# Patient Record
Sex: Female | Born: 1960 | Race: Black or African American | Hispanic: No | Smoking: Never smoker
Health system: Southern US, Community
[De-identification: ages and names within clinical notes are randomized; demographics above are authoritative.]

## PROBLEM LIST (undated history)

## (undated) DIAGNOSIS — K644 Residual hemorrhoidal skin tags: Secondary | ICD-10-CM

## (undated) DIAGNOSIS — R7303 Prediabetes: Secondary | ICD-10-CM

## (undated) DIAGNOSIS — S82092A Other fracture of left patella, initial encounter for closed fracture: Secondary | ICD-10-CM

## (undated) DIAGNOSIS — R002 Palpitations: Secondary | ICD-10-CM

## (undated) DIAGNOSIS — F419 Anxiety disorder, unspecified: Secondary | ICD-10-CM

## (undated) DIAGNOSIS — D649 Anemia, unspecified: Secondary | ICD-10-CM

## (undated) DIAGNOSIS — I471 Supraventricular tachycardia: Secondary | ICD-10-CM

## (undated) DIAGNOSIS — K219 Gastro-esophageal reflux disease without esophagitis: Secondary | ICD-10-CM

## (undated) DIAGNOSIS — F411 Generalized anxiety disorder: Secondary | ICD-10-CM

## (undated) DIAGNOSIS — E559 Vitamin D deficiency, unspecified: Secondary | ICD-10-CM

## (undated) DIAGNOSIS — N63 Unspecified lump in unspecified breast: Secondary | ICD-10-CM

## (undated) DIAGNOSIS — R011 Cardiac murmur, unspecified: Secondary | ICD-10-CM

## (undated) DIAGNOSIS — F320 Major depressive disorder, single episode, mild: Secondary | ICD-10-CM

## (undated) DIAGNOSIS — J45909 Unspecified asthma, uncomplicated: Secondary | ICD-10-CM

## (undated) DIAGNOSIS — R519 Headache, unspecified: Secondary | ICD-10-CM

## (undated) DIAGNOSIS — R55 Syncope and collapse: Secondary | ICD-10-CM

## (undated) DIAGNOSIS — G44229 Chronic tension-type headache, not intractable: Secondary | ICD-10-CM

## (undated) DIAGNOSIS — K589 Irritable bowel syndrome without diarrhea: Secondary | ICD-10-CM

## (undated) HISTORY — DX: Palpitations: R00.2

## (undated) HISTORY — DX: Other fracture of left patella, initial encounter for closed fracture: S82.092A

## (undated) HISTORY — DX: Gastro-esophageal reflux disease without esophagitis: K21.9

## (undated) HISTORY — DX: Anxiety disorder, unspecified: F41.9

## (undated) HISTORY — PX: ABDOMINAL HYSTERECTOMY: SHX81

## (undated) HISTORY — DX: Supraventricular tachycardia: I47.1

## (undated) HISTORY — DX: Unspecified lump in unspecified breast: N63.0

## (undated) HISTORY — PX: LAPAROSCOPIC TOTAL HYSTERECTOMY: SUR800

## (undated) HISTORY — DX: Vitamin D deficiency, unspecified: E55.9

## (undated) HISTORY — DX: Irritable bowel syndrome, unspecified: K58.9

## (undated) HISTORY — DX: Residual hemorrhoidal skin tags: K64.4

---

## 2005-06-23 ENCOUNTER — Ambulatory Visit: Payer: Self-pay | Admitting: Family Medicine

## 2005-07-07 ENCOUNTER — Ambulatory Visit: Payer: Self-pay | Admitting: Family Medicine

## 2007-04-29 ENCOUNTER — Ambulatory Visit: Payer: Self-pay | Admitting: Family Medicine

## 2007-06-06 ENCOUNTER — Ambulatory Visit: Payer: Self-pay | Admitting: Family Medicine

## 2007-06-24 ENCOUNTER — Ambulatory Visit: Payer: Self-pay | Admitting: Gastroenterology

## 2007-06-24 LAB — HM COLONOSCOPY: HM Colonoscopy: NORMAL

## 2007-07-15 ENCOUNTER — Ambulatory Visit: Payer: Self-pay | Admitting: Internal Medicine

## 2007-07-15 DIAGNOSIS — K644 Residual hemorrhoidal skin tags: Secondary | ICD-10-CM | POA: Insufficient documentation

## 2007-07-19 ENCOUNTER — Ambulatory Visit: Payer: Self-pay | Admitting: Internal Medicine

## 2007-08-15 ENCOUNTER — Ambulatory Visit: Payer: Self-pay | Admitting: Internal Medicine

## 2007-09-15 ENCOUNTER — Ambulatory Visit: Payer: Self-pay | Admitting: Internal Medicine

## 2007-09-30 ENCOUNTER — Ambulatory Visit: Payer: Self-pay | Admitting: General Surgery

## 2007-10-13 ENCOUNTER — Ambulatory Visit: Payer: Self-pay | Admitting: Internal Medicine

## 2007-10-15 ENCOUNTER — Ambulatory Visit: Payer: Self-pay | Admitting: Internal Medicine

## 2007-11-13 ENCOUNTER — Ambulatory Visit: Payer: Self-pay | Admitting: Internal Medicine

## 2007-11-21 ENCOUNTER — Ambulatory Visit: Payer: Self-pay | Admitting: Internal Medicine

## 2007-12-13 ENCOUNTER — Ambulatory Visit: Payer: Self-pay | Admitting: Internal Medicine

## 2008-01-13 ENCOUNTER — Ambulatory Visit: Payer: Self-pay | Admitting: Internal Medicine

## 2008-03-13 ENCOUNTER — Ambulatory Visit: Payer: Self-pay | Admitting: Internal Medicine

## 2008-03-14 ENCOUNTER — Ambulatory Visit: Payer: Self-pay | Admitting: Internal Medicine

## 2008-04-14 ENCOUNTER — Ambulatory Visit: Payer: Self-pay | Admitting: Internal Medicine

## 2008-04-24 ENCOUNTER — Ambulatory Visit: Payer: Self-pay | Admitting: Internal Medicine

## 2008-05-14 ENCOUNTER — Ambulatory Visit: Payer: Self-pay | Admitting: Internal Medicine

## 2008-06-14 ENCOUNTER — Ambulatory Visit: Payer: Self-pay | Admitting: Internal Medicine

## 2008-06-19 ENCOUNTER — Ambulatory Visit: Payer: Self-pay | Admitting: Internal Medicine

## 2008-07-14 ENCOUNTER — Ambulatory Visit: Payer: Self-pay | Admitting: Internal Medicine

## 2008-08-14 ENCOUNTER — Ambulatory Visit: Payer: Self-pay | Admitting: Internal Medicine

## 2008-09-10 ENCOUNTER — Ambulatory Visit: Payer: Self-pay | Admitting: Endocrinology

## 2008-09-14 ENCOUNTER — Ambulatory Visit: Payer: Self-pay | Admitting: Internal Medicine

## 2008-09-25 ENCOUNTER — Ambulatory Visit: Payer: Self-pay | Admitting: Endocrinology

## 2008-10-12 ENCOUNTER — Ambulatory Visit: Payer: Self-pay | Admitting: Internal Medicine

## 2008-10-22 ENCOUNTER — Ambulatory Visit: Payer: Self-pay | Admitting: Internal Medicine

## 2008-10-30 ENCOUNTER — Ambulatory Visit: Payer: Self-pay | Admitting: Obstetrics and Gynecology

## 2008-11-12 ENCOUNTER — Ambulatory Visit: Payer: Self-pay | Admitting: Internal Medicine

## 2008-11-19 ENCOUNTER — Inpatient Hospital Stay: Payer: Self-pay | Admitting: Obstetrics and Gynecology

## 2008-12-12 ENCOUNTER — Ambulatory Visit: Payer: Self-pay | Admitting: Internal Medicine

## 2008-12-22 ENCOUNTER — Ambulatory Visit: Payer: Self-pay | Admitting: Internal Medicine

## 2009-01-12 ENCOUNTER — Ambulatory Visit: Payer: Self-pay | Admitting: Internal Medicine

## 2009-03-14 ENCOUNTER — Ambulatory Visit: Payer: Self-pay | Admitting: Internal Medicine

## 2009-03-16 ENCOUNTER — Ambulatory Visit: Payer: Self-pay | Admitting: Internal Medicine

## 2009-04-01 DIAGNOSIS — N63 Unspecified lump in unspecified breast: Secondary | ICD-10-CM | POA: Insufficient documentation

## 2009-04-14 ENCOUNTER — Ambulatory Visit: Payer: Self-pay | Admitting: Internal Medicine

## 2009-05-14 ENCOUNTER — Ambulatory Visit: Payer: Self-pay | Admitting: Internal Medicine

## 2009-06-14 ENCOUNTER — Ambulatory Visit: Payer: Self-pay | Admitting: Internal Medicine

## 2009-07-14 ENCOUNTER — Ambulatory Visit: Payer: Self-pay | Admitting: Internal Medicine

## 2009-08-11 ENCOUNTER — Ambulatory Visit: Payer: Self-pay | Admitting: Internal Medicine

## 2009-08-14 ENCOUNTER — Ambulatory Visit: Payer: Self-pay | Admitting: Internal Medicine

## 2009-10-12 ENCOUNTER — Ambulatory Visit: Payer: Self-pay | Admitting: Internal Medicine

## 2009-11-03 ENCOUNTER — Ambulatory Visit: Payer: Self-pay | Admitting: Internal Medicine

## 2009-11-12 ENCOUNTER — Ambulatory Visit: Payer: Self-pay | Admitting: Internal Medicine

## 2011-06-13 ENCOUNTER — Ambulatory Visit: Payer: Self-pay | Admitting: Family Medicine

## 2011-06-16 LAB — HM DIABETES EYE EXAM

## 2011-08-15 HISTORY — PX: BREAST BIOPSY: SHX20

## 2012-01-05 ENCOUNTER — Other Ambulatory Visit: Payer: Self-pay | Admitting: Family Medicine

## 2012-01-05 LAB — COMPREHENSIVE METABOLIC PANEL
Albumin: 3.5 g/dL (ref 3.4–5.0)
Alkaline Phosphatase: 57 U/L (ref 50–136)
Anion Gap: 7 (ref 7–16)
Bilirubin,Total: 0.3 mg/dL (ref 0.2–1.0)
Calcium, Total: 8.5 mg/dL (ref 8.5–10.1)
Co2: 28 mmol/L (ref 21–32)
Creatinine: 0.68 mg/dL (ref 0.60–1.30)
EGFR (African American): >60
EGFR (Non-African Amer.): >60
Glucose: 78 mg/dL (ref 65–99)
SGPT (ALT): 14 U/L

## 2012-01-05 LAB — CBC WITH DIFFERENTIAL/PLATELET
Basophil #: 0 10*3/uL (ref 0.0–0.1)
Basophil %: 0.4 %
Eosinophil #: 0.2 10*3/uL (ref 0.0–0.7)
Eosinophil %: 2.9 %
HCT: 36.6 % (ref 35.0–47.0)
HGB: 11.7 g/dL — ABNORMAL LOW (ref 12.0–16.0)
Lymphocyte %: 28 %
MCV: 92 fL (ref 80–100)
RBC: 3.98 10*6/uL (ref 3.80–5.20)
RDW: 14.5 % (ref 11.5–14.5)
WBC: 8.2 10*3/uL (ref 3.6–11.0)

## 2012-01-10 ENCOUNTER — Ambulatory Visit: Payer: Self-pay | Admitting: Family Medicine

## 2012-01-12 ENCOUNTER — Encounter: Payer: Self-pay | Admitting: Cardiovascular Disease

## 2012-01-12 ENCOUNTER — Ambulatory Visit (INDEPENDENT_AMBULATORY_CARE_PROVIDER_SITE_OTHER): Payer: No Typology Code available for payment source | Admitting: Cardiovascular Disease

## 2012-01-12 VITALS — BP 128/70 | HR 59 | Ht 64.0 in | Wt 129.2 lb

## 2012-01-12 DIAGNOSIS — R002 Palpitations: Secondary | ICD-10-CM

## 2012-01-12 DIAGNOSIS — R079 Chest pain, unspecified: Secondary | ICD-10-CM

## 2012-01-12 DIAGNOSIS — R0602 Shortness of breath: Secondary | ICD-10-CM

## 2012-01-12 NOTE — Assessment & Plan Note (Signed)
The patient reports increased symptoms of palpitations with her chest pain lately mostly at night. She has no previous history of arrhythmia. I recommend a 48-hour Holter monitor. If this shows significant abnormalities, an echocardiogram can be performed.

## 2012-01-12 NOTE — Patient Instructions (Signed)
Your physician has recommended that you wear a holter monitor. Holter monitors are medical devices that record the heart's electrical activity. Doctors most often use these monitors to diagnose arrhythmias. Arrhythmias are problems with the speed or rhythm of the heartbeat. The monitor is a small, portable device. You can wear one while you do your normal daily activities. This is usually used to diagnose what is causing palpitations/syncope (passing out).  Your physician has requested that you have an exercise tolerance test. For further information please visit [email or web address] Please also follow instruction sheet, as given.  Follow up as needed if tests are abnormal.

## 2012-01-12 NOTE — Assessment & Plan Note (Signed)
Her chest pain is atypical and likely due to gastroesophageal reflux disease. However, her symptoms persisted in spite of treatment with omeprazole. I recommend proceeding with a treadmill stress test for further evaluation. Her cardiac exam is unremarkable and she has no cardiac murmurs. She reports no dyspnea.

## 2012-01-12 NOTE — Progress Notes (Signed)
HPI  This is a 51 year old female who was referred by Dr. Carlynn Purl for evaluation of chest pain and palpitations. The patient has no previous cardiac history. She has history of gastroesophageal reflux disease for many years. She had worsening of her chest pain at night recently described as an aching sensation without radiation. The discomfort mostly happens at rest specifically at night. She has not had any significant dyspnea. She has noticed increased palpitations with these episodes lately but not on a daily basis. She feels her heart is going fast with occasional skipping. She is not aware of any previous history of arrhythmia. She was given omeprazole recently and had improvement in her symptoms. However, they did not resolve.  No Known Allergies   Current Outpatient Prescriptions on File Prior to Visit  Medication Sig Dispense Refill  . omeprazole (PRILOSEC) 20 MG capsule Take 20 mg by mouth 2 (two) times daily.          Past Medical History  Diagnosis Date  . Unspecified vitamin D deficiency   . External hemorrhoids without mention of complication   . Lump or mass in breast   . Palpitations   . Diabetes mellitus     borderline?     Past Surgical History  Procedure Date  . Laparoscopic total hysterectomy      History reviewed. No pertinent family history.   History   Social History  . Marital Status: Married    Spouse Name: N/A    Number of Children: N/A  . Years of Education: N/A   Occupational History  . Not on file.   Social History Main Topics  . Smoking status: Never Smoker   . Smokeless tobacco: Not on file  . Alcohol Use: No  . Drug Use: No  . Sexually Active:    Other Topics Concern  . Not on file   Social History Narrative  . No narrative on file     ROS Constitutional: Negative for fever, chills, diaphoresis, activity change, appetite change and fatigue.  HENT: Negative for hearing loss, nosebleeds, congestion, sore throat, facial  swelling, drooling, trouble swallowing, neck pain, voice change, sinus pressure and tinnitus.  Eyes: Negative for photophobia, pain, discharge and visual disturbance.  Respiratory: Negative for apnea, cough, shortness of breath and wheezing.  Cardiovascular: Negative for and leg swelling.  Gastrointestinal: Negative for nausea, vomiting, abdominal pain, diarrhea, constipation, blood in stool and abdominal distention.  Genitourinary: Negative for dysuria, urgency, frequency, hematuria and decreased urine volume.  Musculoskeletal: Negative for myalgias, back pain, joint swelling, arthralgias and gait problem.  Skin: Negative for color change, pallor, rash and wound.  Neurological: Negative for dizziness, tremors, seizures, syncope, speech difficulty, weakness, light-headedness, numbness and headaches.  Psychiatric/Behavioral: Negative for suicidal ideas, hallucinations, behavioral problems and agitation. The patient is not nervous/anxious.     PHYSICAL EXAM   BP 128/70  Pulse 59  Ht 5\' 4"  (1.626 m)  Wt 129 lb 4 oz (58.627 kg)  BMI 22.19 kg/m2 Constitutional: She is oriented to person, place, and time. She appears well-developed and well-nourished. No distress.  HENT: No nasal discharge.  Head: Normocephalic and atraumatic.  Eyes: Pupils are equal and round. Right eye exhibits no discharge. Left eye exhibits no discharge.  Neck: Normal range of motion. Neck supple. No JVD present. No thyromegaly present.  Cardiovascular: Normal rate, regular rhythm, normal heart sounds. Exam reveals no gallop and no friction rub. No murmur heard.  Pulmonary/Chest: Effort normal and breath sounds normal. No  stridor. No respiratory distress. She has no wheezes. She has no rales. She exhibits no tenderness.  Abdominal: Soft. Bowel sounds are normal. She exhibits no distension. There is no tenderness. There is no rebound and no guarding.  Musculoskeletal: Normal range of motion. She exhibits no edema and no  tenderness.  Neurological: She is alert and oriented to person, place, and time. Coordination normal.  Skin: Skin is warm and dry. No rash noted. She is not diaphoretic. No erythema. No pallor.  Psychiatric: She has a normal mood and affect. Her behavior is normal. Judgment and thought content normal.     EKG: Sinus  Bradycardia  -  Diffuse nonspecific T-abnormality.    ASSESSMENT AND PLAN

## 2012-01-25 ENCOUNTER — Encounter: Payer: No Typology Code available for payment source | Admitting: Cardiovascular Disease

## 2012-02-01 ENCOUNTER — Encounter: Payer: Self-pay | Admitting: Cardiovascular Disease

## 2012-02-01 ENCOUNTER — Encounter (INDEPENDENT_AMBULATORY_CARE_PROVIDER_SITE_OTHER): Payer: No Typology Code available for payment source

## 2012-02-01 ENCOUNTER — Ambulatory Visit (INDEPENDENT_AMBULATORY_CARE_PROVIDER_SITE_OTHER): Payer: No Typology Code available for payment source | Admitting: Cardiovascular Disease

## 2012-02-01 VITALS — BP 120/72 | HR 59 | Ht 64.0 in | Wt 129.2 lb

## 2012-02-01 DIAGNOSIS — R0600 Dyspnea, unspecified: Secondary | ICD-10-CM

## 2012-02-01 DIAGNOSIS — R079 Chest pain, unspecified: Secondary | ICD-10-CM

## 2012-02-01 DIAGNOSIS — R002 Palpitations: Secondary | ICD-10-CM

## 2012-02-01 DIAGNOSIS — R0989 Other specified symptoms and signs involving the circulatory and respiratory systems: Secondary | ICD-10-CM

## 2012-02-01 DIAGNOSIS — R0609 Other forms of dyspnea: Secondary | ICD-10-CM

## 2012-02-01 NOTE — Procedures (Signed)
    Treadmill Stress test  Indication: Atypical chest pain and palpitations.  Baseline Data:  Resting EKG shows NSR with rate of 79 bpm, nonspecific T wave changes Resting blood pressure of 120/72 mm Hg Stand bruce protocal was used.  Exercise Data:  Patient exercised for 3 min 0 sec,  Peak heart rate of 238 bpm.  This was 140 % of the maximum predicted heart rate. She had sinus tachycardia with a heart rate of 160 beats per minute followed by a brief run of supraventricular tachycardia. 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 She converted to normal sinus rhythm quickly in recovery. No symptoms of chest pain or lightheadedness were reported at peak stress or in recovery.  Peak Blood pressure recorded was 130/80 Maximal work level: 4.6 METs.  Heart rate at 3 minutes in recovery was 101 bpm. BP response: Normal HR response: Accelerated  EKG with Exercise: Sinus tachycardia with no significant ST changes.  FINAL IMPRESSION: Normal exercise stress test. No significant EKG changes concerning for ischemia. Poor exercise tolerance. Exercise induced supraventricular tachycardia with a heart rate of 238 beats per minute. 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  Recommendation: Proceed with 48-hour Holter monitor. I will also obtain an echocardiogram. Followup after cardiac testing. She will need treatment with calcium channel or beta blocker. EP consult will be considered.

## 2012-02-01 NOTE — Patient Instructions (Addendum)
Your stress test was fine but showed fast heartbeats.  Holter monitor today.  Schedule an Echo.  Your physician has requested that you have an echocardiogram. Echocardiography is a painless test that uses sound waves to create images of your heart. It provides your doctor with information about the size and shape of your heart and how well your heart's chambers and valves are working. This procedure takes approximately one hour. There are no restrictions for this procedure.  Follow up after echo

## 2012-02-09 ENCOUNTER — Telehealth: Payer: Self-pay

## 2012-02-09 NOTE — Telephone Encounter (Signed)
Pt called back. Pt informed of results Understanding verb.

## 2012-02-09 NOTE — Telephone Encounter (Signed)
Attempted to reach pt re: nml HM results. No answer VM box has not been set up yet

## 2012-02-19 ENCOUNTER — Other Ambulatory Visit: Payer: No Typology Code available for payment source

## 2012-02-23 ENCOUNTER — Ambulatory Visit: Payer: Self-pay | Admitting: Surgery

## 2012-02-23 ENCOUNTER — Ambulatory Visit: Payer: No Typology Code available for payment source | Admitting: Cardiovascular Disease

## 2012-02-27 ENCOUNTER — Other Ambulatory Visit (INDEPENDENT_AMBULATORY_CARE_PROVIDER_SITE_OTHER): Payer: No Typology Code available for payment source

## 2012-02-27 ENCOUNTER — Other Ambulatory Visit: Payer: Self-pay

## 2012-02-27 DIAGNOSIS — R0600 Dyspnea, unspecified: Secondary | ICD-10-CM

## 2012-02-27 DIAGNOSIS — R0989 Other specified symptoms and signs involving the circulatory and respiratory systems: Secondary | ICD-10-CM

## 2012-02-27 DIAGNOSIS — R0609 Other forms of dyspnea: Secondary | ICD-10-CM

## 2012-03-01 ENCOUNTER — Ambulatory Visit: Payer: No Typology Code available for payment source | Admitting: Cardiovascular Disease

## 2012-03-04 ENCOUNTER — Ambulatory Visit: Payer: No Typology Code available for payment source | Admitting: Cardiovascular Disease

## 2012-03-20 ENCOUNTER — Encounter: Payer: Self-pay | Admitting: Cardiovascular Disease

## 2012-04-08 ENCOUNTER — Ambulatory Visit: Payer: No Typology Code available for payment source | Admitting: Cardiovascular Disease

## 2012-04-12 ENCOUNTER — Encounter: Payer: Self-pay | Admitting: Cardiovascular Disease

## 2012-04-12 ENCOUNTER — Ambulatory Visit: Payer: No Typology Code available for payment source | Admitting: Cardiovascular Disease

## 2012-04-12 ENCOUNTER — Ambulatory Visit (INDEPENDENT_AMBULATORY_CARE_PROVIDER_SITE_OTHER): Payer: No Typology Code available for payment source | Admitting: Cardiovascular Disease

## 2012-04-12 VITALS — BP 110/70 | HR 67 | Ht 64.0 in | Wt 126.8 lb

## 2012-04-12 DIAGNOSIS — I471 Supraventricular tachycardia, unspecified: Secondary | ICD-10-CM

## 2012-04-12 DIAGNOSIS — R002 Palpitations: Secondary | ICD-10-CM

## 2012-04-12 DIAGNOSIS — R079 Chest pain, unspecified: Secondary | ICD-10-CM

## 2012-04-12 DIAGNOSIS — R0602 Shortness of breath: Secondary | ICD-10-CM

## 2012-04-12 HISTORY — DX: Supraventricular tachycardia: I47.1

## 2012-04-12 HISTORY — DX: Supraventricular tachycardia, unspecified: I47.10

## 2012-04-12 NOTE — Patient Instructions (Addendum)
Follow up in 6 months 

## 2012-04-12 NOTE — Assessment & Plan Note (Signed)
This improved significantly and was likely due to GERD.

## 2012-04-12 NOTE — Assessment & Plan Note (Signed)
The patient had one documented episode of exercise-induced supraventricular tachycardia during treadmill stress test. Currently she reports improved symptoms of palpitations and no tachycardia. Her description of the palpitations seems to be consistent more with premature beats then with sustained supraventricular tachycardia. I'm hesitant to start a beta blocker or calcium channel blocker due to her relatively low blood pressure. Given that her symptoms improved, I think it's reasonable to monitor her and consider an antiarrhythmic medications if she develops recurrent episodes. I will have her followup with me in 6 months I advised her to call earlier if she has recurrent symptoms

## 2012-04-12 NOTE — Progress Notes (Signed)
HPI  This is a 51 year old female who is here today for a followup visit regarding chest pain and palpitations. The patient has no previous cardiac history. She has history of gastroesophageal reflux disease for many years. She had worsening of her chest pain at night recently described as an aching sensation without radiation. The discomfort mostly happens at rest specifically at night. She has not had any significant dyspnea. She has noticed increased palpitations with these episodes lately but not on a daily basis. She underwent a treadmill stress test which showed no evidence of ischemia. However, at peak stress, she went into supraventricular tachycardia which was brief and terminated spontaneously. She had a Holter monitor done which showed PVCs without other arrhythmia. Echocardiogram was normal. She reports improved symptoms today with less palpitations. She denies any long episodes of tachycardia. Her chest pain is significantly better.  No Known Allergies   Current Outpatient Prescriptions on File Prior to Visit  Medication Sig Dispense Refill  . omeprazole (PRILOSEC) 20 MG capsule Take 20 mg by mouth daily.          Past Medical History  Diagnosis Date  . Unspecified vitamin D deficiency   . External hemorrhoids without mention of complication   . Lump or mass in breast   . Palpitations   . Diabetes mellitus     borderline?     Past Surgical History  Procedure Date  . Laparoscopic total hysterectomy      History reviewed. No pertinent family history.   History   Social History  . Marital Status: Married    Spouse Name: N/A    Number of Children: N/A  . Years of Education: N/A   Occupational History  . Not on file.   Social History Main Topics  . Smoking status: Never Smoker   . Smokeless tobacco: Not on file  . Alcohol Use: No  . Drug Use: No  . Sexually Active:    Other Topics Concern  . Not on file   Social History Narrative  . No narrative on  file      PHYSICAL EXAM   BP 110/70  Pulse 67  Ht 5\' 4"  (1.626 m)  Wt 126 lb 12 oz (57.493 kg)  BMI 21.76 kg/m2 Constitutional: She is oriented to person, place, and time. She appears well-developed and well-nourished. No distress.  HENT: No nasal discharge.  Head: Normocephalic and atraumatic.  Eyes: Pupils are equal and round. Right eye exhibits no discharge. Left eye exhibits no discharge.  Neck: Normal range of motion. Neck supple. No JVD present. No thyromegaly present.  Cardiovascular: Normal rate, regular rhythm, normal heart sounds. Exam reveals no gallop and no friction rub. No murmur heard.  Pulmonary/Chest: Effort normal and breath sounds normal. No stridor. No respiratory distress. She has no wheezes. She has no rales. She exhibits no tenderness.  Abdominal: Soft. Bowel sounds are normal. She exhibits no distension. There is no tenderness. There is no rebound and no guarding.  Musculoskeletal: Normal range of motion. She exhibits no edema and no tenderness.  Neurological: She is alert and oriented to person, place, and time. Coordination normal.  Skin: Skin is warm and dry. No rash noted. She is not diaphoretic. No erythema. No pallor.  Psychiatric: She has a normal mood and affect. Her behavior is normal. Judgment and thought content normal.     EKG: Sinus  Rhythm  -  Nonspecific T-abnormality.   ABNORMAL    ASSESSMENT AND PLAN

## 2012-05-23 ENCOUNTER — Ambulatory Visit: Payer: Self-pay | Admitting: Family Medicine

## 2012-05-24 LAB — HM DEXA SCAN: HM Dexa Scan: NORMAL

## 2012-07-10 ENCOUNTER — Emergency Department: Payer: Self-pay | Admitting: Emergency Medicine

## 2012-07-16 ENCOUNTER — Other Ambulatory Visit: Payer: Self-pay | Admitting: General Practice

## 2012-10-10 ENCOUNTER — Ambulatory Visit: Payer: No Typology Code available for payment source | Admitting: Cardiovascular Disease

## 2012-10-14 ENCOUNTER — Ambulatory Visit (INDEPENDENT_AMBULATORY_CARE_PROVIDER_SITE_OTHER): Payer: 59 | Admitting: Cardiovascular Disease

## 2012-10-14 ENCOUNTER — Encounter: Payer: Self-pay | Admitting: Cardiovascular Disease

## 2012-10-14 ENCOUNTER — Encounter: Payer: Self-pay | Admitting: *Deleted

## 2012-10-14 VITALS — BP 130/80 | HR 66 | Ht 64.0 in | Wt 125.5 lb

## 2012-10-14 DIAGNOSIS — R079 Chest pain, unspecified: Secondary | ICD-10-CM

## 2012-10-14 DIAGNOSIS — I471 Supraventricular tachycardia: Secondary | ICD-10-CM

## 2012-10-14 DIAGNOSIS — R0602 Shortness of breath: Secondary | ICD-10-CM

## 2012-10-14 MED ORDER — METOPROLOL TARTRATE 25 MG PO TABS: 25.0000 mg | ORAL_TABLET | Freq: Two times a day (BID) | ORAL | Status: DC | PRN

## 2012-10-14 NOTE — Assessment & Plan Note (Signed)
The patient has not had any documented episodes of SVT over the last 6 months. She had recent palpitations and tachycardia in the setting of anxiety. Given that her symptoms are not very frequent, I will provide her with metoprolol tartrate 25 mg twice daily to be used as needed only for tachycardia .

## 2012-10-14 NOTE — Patient Instructions (Addendum)
Use Metoprolol twice daily as needed for fast heart beats.  Follow up in 6 months.
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
## 2012-11-13 ENCOUNTER — Ambulatory Visit: Payer: Self-pay | Admitting: Family Medicine

## 2012-12-06 ENCOUNTER — Ambulatory Visit: Payer: Self-pay | Admitting: Gastroenterology

## 2013-04-11 ENCOUNTER — Encounter: Payer: Self-pay | Admitting: Cardiovascular Disease

## 2013-04-11 ENCOUNTER — Ambulatory Visit (INDEPENDENT_AMBULATORY_CARE_PROVIDER_SITE_OTHER): Payer: 59 | Admitting: Cardiovascular Disease

## 2013-04-11 VITALS — BP 102/74 | HR 67 | Ht 64.0 in | Wt 136.2 lb

## 2013-04-11 DIAGNOSIS — I471 Supraventricular tachycardia, unspecified: Secondary | ICD-10-CM

## 2013-04-11 DIAGNOSIS — R0789 Other chest pain: Secondary | ICD-10-CM

## 2013-04-11 DIAGNOSIS — R079 Chest pain, unspecified: Secondary | ICD-10-CM

## 2013-04-11 NOTE — Patient Instructions (Addendum)
Continue same medications  Follow up in 1 year

## 2013-04-11 NOTE — Progress Notes (Signed)
HPI  This is a 52 year old female who is here today for a followup visit regarding paroxysmal supraventricular tachycardia. She was in 2013 for chest pain and palpitations. She underwent a treadmill stress test which showed no evidence of ischemia. However, at peak stress, she went into supraventricular tachycardia with a heart rate of 220 beats per minute which was brief and terminated spontaneously. She had a Holter monitor done which showed PVCs without other arrhythmia. Echocardiogram was normal.  Her episodes have not been frequent and thus she was placed on metoprolol 25 mg twice daily to be used as needed. She reports no change in her symptoms. There has been no worsening. On average, she has to use metoprolol once a week or every 2 weeks. She denies any prolonged tachycardia.  No Known Allergies   Current Outpatient Prescriptions on File Prior to Visit  Medication Sig Dispense Refill  . acetaminophen (TYLENOL) 500 MG chewable tablet Chew 500 mg by mouth every 6 (six) hours as needed.      Marland Kitchen aspirin 81 MG tablet Take 81 mg by mouth as needed.       . metoprolol tartrate (LOPRESSOR) 25 MG tablet Take 1 tablet (25 mg total) by mouth 2 (two) times daily as needed (for fast heart beats).  60 tablet  6  . omeprazole (PRILOSEC) 20 MG capsule Take 20 mg by mouth daily.       . Vitamin D, Ergocalciferol, (DRISDOL) 50000 UNITS CAPS Take 50,000 Units by mouth every 7 (seven) days.       No current facility-administered medications on file prior to visit.     Past Medical History  Diagnosis Date  . Unspecified vitamin D deficiency   . External hemorrhoids without mention of complication   . Lump or mass in breast   . Palpitations   . Diabetes mellitus     borderline?  Marland Kitchen PSVT (paroxysmal supraventricular tachycardia) 04/12/2012     Past Surgical History  Procedure Laterality Date  . Laparoscopic total hysterectomy       Family History  Problem Relation Age of Onset  . Family  history unknown: Yes     History   Social History  . Marital Status: Married    Spouse Name: N/A    Number of Children: N/A  . Years of Education: N/A   Occupational History  . Not on file.   Social History Main Topics  . Smoking status: Never Smoker   . Smokeless tobacco: Not on file  . Alcohol Use: No  . Drug Use: No  . Sexual Activity: Not on file   Other Topics Concern  . Not on file   Social History Narrative  . No narrative on file      PHYSICAL EXAM   BP 102/74  Pulse 67  Ht 5\' 4"  (1.626 m)  Wt 136 lb 4 oz (61.803 kg)  BMI 23.38 kg/m2 Constitutional: She is oriented to person, place, and time. She appears well-developed and well-nourished. No distress.  HENT: No nasal discharge.  Head: Normocephalic and atraumatic.  Eyes: Pupils are equal and round. Right eye exhibits no discharge. Left eye exhibits no discharge.  Neck: Normal range of motion. Neck supple. No JVD present. No thyromegaly present.  Cardiovascular: Normal rate, regular rhythm, normal heart sounds. Exam reveals no gallop and no friction rub. No murmur heard.  Pulmonary/Chest: Effort normal and breath sounds normal. No stridor. No respiratory distress. She has no wheezes. She has no rales. She exhibits  no tenderness.  Abdominal: Soft. Bowel sounds are normal. She exhibits no distension. There is no tenderness. There is no rebound and no guarding.  Musculoskeletal: Normal range of motion. She exhibits no edema and no tenderness.  Neurological: She is alert and oriented to person, place, and time. Coordination normal.  Skin: Skin is warm and dry. No rash noted. She is not diaphoretic. No erythema. No pallor.  Psychiatric: She has a normal mood and affect. Her behavior is normal. Judgment and thought content normal.     NWG:[id number]  Rhythm  -  Diffuse nonspecific T-abnormality.   ABNORMAL     ASSESSMENT AND PLAN

## 2013-04-11 NOTE — Assessment & Plan Note (Signed)
No prolonged episodes of tachycardia. Her symptoms are controlled on metoprolol which is being used as needed. Continue current treatment. Followup in one year or earlier if needed.

## 2013-05-05 ENCOUNTER — Telehealth: Payer: Self-pay

## 2013-05-05 NOTE — Telephone Encounter (Signed)
Spoke w/ pt.  She states that this weekend, she had an "episode" where her "heart was floating, racing and hurting me".  She sat down for "a little bit" as she did not want to go the ED and symptoms resolved on resting.  Asked pt if she took her metoprolol, as she has an rx for metoprolol 25mg  bid prn for tachycardia. She states that she has not taken it, as she "didn't know what it was for". Instructed pt to monitor her heart rate, and if it is elevated, she can take up to two pills a day. Asked her to call me if she does not have the medication or if she can any other questions or concerns.   Pt to call the office immediately if she has any more chest pain.

## 2013-11-14 ENCOUNTER — Ambulatory Visit: Payer: Self-pay | Admitting: Family Medicine

## 2013-11-14 LAB — HM MAMMOGRAPHY: HM Mammogram: NORMAL

## 2014-03-29 ENCOUNTER — Ambulatory Visit: Payer: Self-pay

## 2014-03-29 LAB — URINALYSIS, COMPLETE
BLOOD: NEGATIVE
Bilirubin,UR: NEGATIVE
GLUCOSE, UR: NEGATIVE
Ketone: NEGATIVE
Nitrite: NEGATIVE
Ph: 6 (ref 5.0–8.0)
Protein: NEGATIVE
Specific Gravity: 1.005 (ref 1.000–1.030)

## 2014-03-29 LAB — COMPREHENSIVE METABOLIC PANEL
Albumin: 3.8 g/dL (ref 3.4–5.0)
Alkaline Phosphatase: 85 U/L
Anion Gap: 9 (ref 7–16)
BUN: 12 mg/dL (ref 7–18)
Bilirubin,Total: 0.4 mg/dL (ref 0.2–1.0)
CALCIUM: 9.7 mg/dL (ref 8.5–10.1)
CO2: 27 mmol/L (ref 21–32)
Chloride: 102 mmol/L (ref 98–107)
Creatinine: 0.81 mg/dL (ref 0.60–1.30)
EGFR (African American): >60
EGFR (Non-African Amer.): >60
GLUCOSE: 101 mg/dL — AB (ref 65–99)
Osmolality: 276 (ref 275–301)
Potassium: 3.7 mmol/L (ref 3.5–5.1)
SGOT(AST): 16 U/L (ref 15–37)
SGPT (ALT): 19 U/L
SODIUM: 138 mmol/L (ref 136–145)
TOTAL PROTEIN: 8.5 g/dL — AB (ref 6.4–8.2)

## 2014-03-29 LAB — CBC WITH DIFFERENTIAL/PLATELET
BASOS PCT: 0.4 %
Basophil #: 0.1 10*3/uL (ref 0.0–0.1)
EOS ABS: 0 10*3/uL (ref 0.0–0.7)
EOS PCT: 0.1 %
HCT: 37.7 % (ref 35.0–47.0)
HGB: 12.5 g/dL (ref 12.0–16.0)
Lymphocyte #: 2.1 10*3/uL (ref 1.0–3.6)
Lymphocyte %: 14.7 %
MCH: 29.2 pg (ref 26.0–34.0)
MCHC: 33.2 g/dL (ref 32.0–36.0)
MCV: 88 fL (ref 80–100)
Monocyte #: 0.8 x10 3/mm (ref 0.2–0.9)
Monocyte %: 5.4 %
NEUTROS PCT: 79.4 %
Neutrophil #: 11.3 10*3/uL — ABNORMAL HIGH (ref 1.4–6.5)
Platelet: 195 10*3/uL (ref 150–440)
RBC: 4.28 10*6/uL (ref 3.80–5.20)
RDW: 14.4 % (ref 11.5–14.5)
WBC: 14.2 10*3/uL — ABNORMAL HIGH (ref 3.6–11.0)

## 2014-03-29 LAB — TSH: THYROID STIMULATING HORM: 0.419 u[IU]/mL — AB

## 2014-03-31 LAB — URINE CULTURE

## 2014-04-13 ENCOUNTER — Ambulatory Visit (INDEPENDENT_AMBULATORY_CARE_PROVIDER_SITE_OTHER): Payer: 59 | Admitting: Cardiovascular Disease

## 2014-04-13 ENCOUNTER — Ambulatory Visit: Payer: 59 | Admitting: Cardiovascular Disease

## 2014-04-13 ENCOUNTER — Encounter: Payer: Self-pay | Admitting: Cardiovascular Disease

## 2014-04-13 VITALS — BP 118/84 | HR 55 | Ht 64.0 in | Wt 137.2 lb

## 2014-04-13 DIAGNOSIS — R002 Palpitations: Secondary | ICD-10-CM

## 2014-04-13 DIAGNOSIS — I471 Supraventricular tachycardia: Secondary | ICD-10-CM

## 2014-04-13 NOTE — Progress Notes (Signed)
HPI  This is a 53 year old female who is here today for a followup visit regarding paroxysmal supraventricular tachycardia. She was in 2013 for chest pain and palpitations. She underwent a treadmill stress test which showed no evidence of ischemia. However, at peak stress, she went into supraventricular tachycardia with a heart rate of 220 beats per minute which was brief and terminated spontaneously. She had a Holter monitor done which showed PVCs without other arrhythmia. Echocardiogram was normal.  Her episodes have not been frequent and thus she was placed on metoprolol 25 mg twice daily to be used as needed.  She reports episodes of brief tachycardia less than once a week. She occasionally has to take metoprolol with good response. She has been having some episodes of anxiety lately.    No Known Allergies   Current Outpatient Prescriptions on File Prior to Visit  Medication Sig Dispense Refill  . acetaminophen (TYLENOL) 500 MG chewable tablet Chew 500 mg by mouth every 6 (six) hours as needed.      Marland Kitchen aspirin 81 MG tablet Take 81 mg by mouth as needed.       . metoprolol tartrate (LOPRESSOR) 25 MG tablet Take 1 tablet (25 mg total) by mouth 2 (two) times daily as needed (for fast heart beats).  60 tablet  6  . omeprazole (PRILOSEC) 20 MG capsule Take 20 mg by mouth daily.       . Vitamin D, Ergocalciferol, (DRISDOL) 50000 UNITS CAPS Take 50,000 Units by mouth every 7 (seven) days.       No current facility-administered medications on file prior to visit.     Past Medical History  Diagnosis Date  . Unspecified vitamin D deficiency   . External hemorrhoids without mention of complication   . Lump or mass in breast   . Palpitations   . Diabetes mellitus     borderline?  Marland Kitchen PSVT (paroxysmal supraventricular tachycardia) 04/12/2012     Past Surgical History  Procedure Laterality Date  . Laparoscopic total hysterectomy       Family History  Problem Relation Age of Onset    . Family history unknown: Yes     History   Social History  . Marital Status: Divorced    Spouse Name: N/A    Number of Children: N/A  . Years of Education: N/A   Occupational History  . Not on file.   Social History Main Topics  . Smoking status: Never Smoker   . Smokeless tobacco: Not on file  . Alcohol Use: No  . Drug Use: No  . Sexual Activity: Not on file   Other Topics Concern  . Not on file   Social History Narrative  . No narrative on file      PHYSICAL EXAM   There were no vitals taken for this visit. Constitutional: She is oriented to person, place, and time. She appears well-developed and well-nourished. No distress.  HENT: No nasal discharge.  Head: Normocephalic and atraumatic.  Eyes: Pupils are equal and round. Right eye exhibits no discharge. Left eye exhibits no discharge.  Neck: Normal range of motion. Neck supple. No JVD present. No thyromegaly present.  Cardiovascular: Normal rate, regular rhythm, normal heart sounds. Exam reveals no gallop and no friction rub. No murmur heard.  Pulmonary/Chest: Effort normal and breath sounds normal. No stridor. No respiratory distress. She has no wheezes. She has no rales. She exhibits no tenderness.  Abdominal: Soft. Bowel sounds are normal. She exhibits no  distension. There is no tenderness. There is no rebound and no guarding.  Musculoskeletal: Normal range of motion. She exhibits no edema and no tenderness.  Neurological: She is alert and oriented to person, place, and time. Coordination normal.  Skin: Skin is warm and dry. No rash noted. She is not diaphoretic. No erythema. No pallor.  Psychiatric: She has a normal mood and affect. Her behavior is normal. Judgment and thought content normal.     NWG:NFAOZ  Bradycardia  -  Nonspecific T-abnormality.   ABNORMAL    ASSESSMENT AND PLAN

## 2014-04-13 NOTE — Assessment & Plan Note (Signed)
She is doing well overall with no evidence of documented recurrent arrhythmia. She continues to complain of rare episodes of brief tachycardia. Symptoms respond to metoprolol which is being used as needed.

## 2014-04-13 NOTE — Patient Instructions (Signed)
Continue same medications.   Your physician wants you to follow-up in: 1 year.  You will receive a reminder letter in the mail two months in advance. If you don't receive a letter, please call our office to schedule the follow-up appointment.  

## 2014-08-18 ENCOUNTER — Encounter: Payer: Self-pay | Admitting: Cardiovascular Disease

## 2014-08-18 ENCOUNTER — Ambulatory Visit (INDEPENDENT_AMBULATORY_CARE_PROVIDER_SITE_OTHER): Payer: 59 | Admitting: Cardiovascular Disease

## 2014-08-18 VITALS — BP 147/80 | HR 96 | Ht 64.0 in | Wt 132.0 lb

## 2014-08-18 DIAGNOSIS — I471 Supraventricular tachycardia: Secondary | ICD-10-CM

## 2014-08-18 DIAGNOSIS — R55 Syncope and collapse: Secondary | ICD-10-CM

## 2014-08-18 NOTE — Patient Instructions (Signed)
You have the common type of fainting (vasovagal syncope). Stay well hydrated and avoid sudden standing up.   Continue same medications.   Your physician wants you to follow-up in: 6 months.  You will receive a reminder letter in the mail two months in advance. If you don't receive a letter, please call our office to schedule the follow-up appointment.

## 2014-08-18 NOTE — Addendum Note (Signed)
Addended by: Sherri RadMCGHEE, Julieann Drummonds C on: 08/18/2014 02:05 PM   Modules accepted: Orders, Medications

## 2014-08-18 NOTE — Progress Notes (Signed)
HPI  This is a 54 year old female who is here today for a followup visit regarding paroxysmal supraventricular tachycardia. She was seen in 2013 for chest pain and palpitations. She underwent a treadmill stress test which showed no evidence of ischemia. However, at peak stress, she went into supraventricular tachycardia with a heart rate of 220 beats per minute which was brief and terminated spontaneously. She had a Holter monitor done which showed PVCs without other arrhythmia. Echocardiogram was normal.  Her episodes have not been frequent and thus she was placed on metoprolol 25 mg twice daily to be used as needed.  She reports episodes of brief tachycardia less than once a week.  She is referred back for evaluation of syncope. She had a syncopal episode yesterday while she was watching her son get a flu shot. He had some bleeding at the site. She felt nauseous and sick in her stomach. She stood to go to the bathroom and all of a sudden she passed out briefly. She had 2 similar episodes in the past while she was hot. She denies any tachycardia or palpitations during these episodes. No chest pain or shortness of breath.   No Known Allergies   Current Outpatient Prescriptions on File Prior to Visit  Medication Sig Dispense Refill  . acetaminophen (TYLENOL) 500 MG chewable tablet Chew 500 mg by mouth every 6 (six) hours as needed.    . ALPRAZolam (XANAX) 0.5 MG tablet Take 0.5 mg by mouth as needed for anxiety.    Marland Kitchen aspirin 81 MG tablet Take 81 mg by mouth as needed.     . butalbital-acetaminophen-caffeine (FIORICET, ESGIC) 50-325-40 MG per tablet Take 1 tablet by mouth as needed for headache.    . Cholecalciferol (VITAMIN D) 2000 UNITS tablet Take 2,000 Units by mouth daily.    Marland Kitchen escitalopram (LEXAPRO) 10 MG tablet Take 10 mg by mouth daily.    Marland Kitchen estrogens, conjugated, (PREMARIN) 0.625 MG tablet Take 0.625 mg by mouth daily. Take daily for 21 days then do not take for 7 days.    .  hyoscyamine (LEVSIN) 0.125 MG tablet Take 0.125 mg by mouth as needed.    . hyoscyamine (LEVSIN, ANASPAZ) 0.125 MG tablet Take 0.125 mg by mouth as needed.    . Linaclotide (LINZESS) 290 MCG CAPS capsule Take 290 mcg by mouth daily.    . metoprolol tartrate (LOPRESSOR) 25 MG tablet Take 1 tablet (25 mg total) by mouth 2 (two) times daily as needed (for fast heart beats). 60 tablet 6  . nitrofurantoin (MACRODANTIN) 100 MG capsule Take 100 mg by mouth at bedtime.    Marland Kitchen omeprazole (PRILOSEC) 20 MG capsule Take 20 mg by mouth daily.      No current facility-administered medications on file prior to visit.     Past Medical History  Diagnosis Date  . Unspecified vitamin D deficiency   . External hemorrhoids without mention of complication   . Lump or mass in breast   . Palpitations   . Diabetes mellitus     borderline?  Marland Kitchen PSVT (paroxysmal supraventricular tachycardia) 04/12/2012  . IBS (irritable bowel syndrome)   . Anxiety   . Heart palpitations      Past Surgical History  Procedure Laterality Date  . Laparoscopic total hysterectomy       History reviewed. No pertinent family history.   History   Social History  . Marital Status: Divorced    Spouse Name: N/A    Number of Children:  N/A  . Years of Education: N/A   Occupational History  . Not on file.   Social History Main Topics  . Smoking status: Never Smoker   . Smokeless tobacco: Not on file  . Alcohol Use: No  . Drug Use: No  . Sexual Activity: Not on file   Other Topics Concern  . Not on file   Social History Narrative      PHYSICAL EXAM   BP 147/80 mmHg  Pulse 96  Ht 5\' 4"  (1.626 m)  Wt 132 lb (59.875 kg)  BMI 22.65 kg/m2 Constitutional: She is oriented to person, place, and time. She appears well-developed and well-nourished. No distress.  HENT: No nasal discharge.  Head: Normocephalic and atraumatic.  Eyes: Pupils are equal and round. Right eye exhibits no discharge. Left eye exhibits no  discharge.  Neck: Normal range of motion. Neck supple. No JVD present. No thyromegaly present.  Cardiovascular: Normal rate, regular rhythm, normal heart sounds. Exam reveals no gallop and no friction rub. No murmur heard.  Pulmonary/Chest: Effort normal and breath sounds normal. No stridor. No respiratory distress. She has no wheezes. She has no rales. She exhibits no tenderness.  Abdominal: Soft. Bowel sounds are normal. She exhibits no distension. There is no tenderness. There is no rebound and no guarding.  Musculoskeletal: Normal range of motion. She exhibits no edema and no tenderness.  Neurological: She is alert and oriented to person, place, and time. Coordination normal.  Skin: Skin is warm and dry. No rash noted. She is not diaphoretic. No erythema. No pallor.  Psychiatric: She has a normal mood and affect. Her behavior is normal. Judgment and thought content normal.      ASSESSMENT AND PLAN

## 2014-08-18 NOTE — Assessment & Plan Note (Signed)
She has not had any documented recurrent arrhythmia. She uses metoprolol orally as needed when she feels palpitations. On average, she uses this once or twice a month.

## 2014-08-18 NOTE — Assessment & Plan Note (Signed)
The patient's episodes are suggestive of vasovagal syncope and do not seem to be related to cardiac arrhythmia. I don't think this is related to her known history of paroxysmal supraventricular tachycardia. I asked her to stay well-hydrated. She is to sit down when she feels nauseous like that. She should avoid situations that can lead to vasovagal syncope as much as possible.

## 2014-11-26 ENCOUNTER — Telehealth: Payer: Self-pay | Admitting: Cardiovascular Disease

## 2014-11-26 ENCOUNTER — Emergency Department
Admit: 2014-11-26 | Disposition: A | Discharge status: Home / Self Care | Payer: Self-pay | Admitting: Emergency Medicine

## 2014-11-26 ENCOUNTER — Telehealth: Payer: Self-pay | Admitting: *Deleted

## 2014-11-26 LAB — BASIC METABOLIC PANEL WITH GFR
Anion Gap: 3 — ABNORMAL LOW
BUN: 11 mg/dL
Calcium, Total: 8.8 mg/dL — ABNORMAL LOW
Chloride: 106 mmol/L
Co2: 29 mmol/L
Creatinine: 0.65 mg/dL
EGFR (African American): >60
EGFR (Non-African Amer.): >60
Glucose: 97 mg/dL
Potassium: 4.1 mmol/L
Sodium: 138 mmol/L

## 2014-11-26 LAB — CBC
HCT: 39.1 %
HGB: 12.9 g/dL
MCH: 29.8 pg
MCHC: 33 g/dL
MCV: 90 fL
Platelet: 176 x10 3/mm 3
RBC: 4.33 X10 6/mm 3
RDW: 14.7 % — ABNORMAL HIGH
WBC: 8.9 x10 3/mm 3

## 2014-11-26 LAB — TROPONIN I
Troponin-I: <0.03 ng/mL
Troponin-I: <0.03 ng/mL

## 2014-11-26 NOTE — Telephone Encounter (Signed)
Spoke w/ pt.  She reports 7/10 chest pain that she describes as sharp. Reports that pain started last night and has continued this am.  Denies n/v or sweating but does report some SOB. Pt is currently at work, reports that the pain worsened when she was outside exerting herself. Pt does not have BP to report and does not know her HR.  Advised her to got to her office nurse and have them check her vitals and see if they can perform an EKG. Advised her that if they cannot do an EKG, to have EMS come out to assess her.  She is agreeable, though she is hesitant and asks for a "pill to make the pain stop". Pt states that she thought she had a pill that would do that, but she does not have her meds w/ her.  Advised her to call 911 or have someone take her to the nearest ED. She states that she can drive herself.   Advised against this.  Pt states that she will go the nurse's office at work and see if they can check her, but she is not sure if she will seek immediate attention outside of that.  Pt sounds calm, no SOB over the phone.  I had to redirect the conversation several times to address her symptoms. She states that she will call back if she decides not to do anything right now.

## 2014-11-26 NOTE — Telephone Encounter (Signed)
Pt called back states BP is 133/79, Pulse 61

## 2014-11-26 NOTE — Telephone Encounter (Signed)
Please call sister to schedule

## 2014-11-26 NOTE — Telephone Encounter (Signed)
Chelsea JacobsonHelen w/ [facility] called stating that pt called their office requesting an appt for chest pain.  Advised her to tell pt to proceed to ED.  She states that she will call pt back and advised her of this.

## 2014-11-26 NOTE — Telephone Encounter (Signed)
Pt c/o of Chest Pain: STAT if CP now or developed within 24 hours  1. Are you having CP right now? Yes, a little bit here and there. It calms down then comes back. Started last night. Really bad last night   2. Are you experiencing any other symptoms (ex. SOB, nausea, vomiting, sweating)? Like is was going all the way down,hand felt numb. In between her breast.   3. How long have you been experiencing CP?  Since last night.   4. Is your CP continuous or coming and going? Come and going   5. Have you taken Nitroglycerin? Just took her meds that were prscibed. She does not have this med.  ?

## 2014-11-26 NOTE — Telephone Encounter (Signed)
Noted, agree with patient needing to go to ED.

## 2014-11-26 NOTE — Telephone Encounter (Signed)
[facility] for CP- needs fu tomorrow  Pt c/o of Chest Pain: STAT if CP now or developed within 24 hours   1. Are you having CP right now? Yes but is better than last night   2. Are you experiencing any other symptoms (ex. SOB, nausea, vomiting, sweating)?   3. How long have you been experiencing CP? Since Wednesday night   4. Is your CP continuous or coming and going? Constant center of chest   5. Have you taken Nitroglycerin? No  ?

## 2014-11-26 NOTE — Telephone Encounter (Signed)
Pt currently in [facility] ED. 

## 2014-11-26 NOTE — Telephone Encounter (Signed)
Spoke w/ pt.  She states that she just wanted to let me know what her BP was.  Asked why pt was not seeking attention, she states that she has a friend who will take her to the ED, she just wanted to call & let us know.  Thanked pt, but advised her not to delay attention any longer.

## 2014-11-27 ENCOUNTER — Telehealth: Payer: Self-pay | Admitting: *Deleted

## 2014-11-27 NOTE — Telephone Encounter (Signed)
Pt sister called trying to make hospital fu apt and i gave her my next apt and she did not like it. She stated she would try and call her pcp and see what to do. She was a bit upset, then hung up.

## 2014-12-08 ENCOUNTER — Other Ambulatory Visit: Payer: Self-pay

## 2014-12-08 DIAGNOSIS — Z1231 Encounter for screening mammogram for malignant neoplasm of breast: Secondary | ICD-10-CM

## 2014-12-17 ENCOUNTER — Ambulatory Visit (INDEPENDENT_AMBULATORY_CARE_PROVIDER_SITE_OTHER): Payer: 59 | Admitting: Cardiovascular Disease

## 2015-01-01 ENCOUNTER — Ambulatory Visit
Admission: RE | Admit: 2015-01-01 | Discharge: 2015-01-01 | Disposition: A | Discharge status: Home / Self Care | Payer: 59 | Source: Ambulatory Visit | Attending: Family Medicine | Admitting: Family Medicine

## 2015-01-01 DIAGNOSIS — Z1231 Encounter for screening mammogram for malignant neoplasm of breast: Secondary | ICD-10-CM | POA: Diagnosis not present

## 2015-01-12 ENCOUNTER — Ambulatory Visit (INDEPENDENT_AMBULATORY_CARE_PROVIDER_SITE_OTHER): Payer: 59 | Admitting: Cardiovascular Disease

## 2015-01-12 ENCOUNTER — Encounter: Payer: Self-pay | Admitting: Cardiovascular Disease

## 2015-01-12 VITALS — BP 90/62 | HR 60 | Ht 64.0 in | Wt 138.8 lb

## 2015-01-12 DIAGNOSIS — R079 Chest pain, unspecified: Secondary | ICD-10-CM | POA: Diagnosis not present

## 2015-01-12 DIAGNOSIS — I471 Supraventricular tachycardia: Secondary | ICD-10-CM

## 2015-01-12 NOTE — Progress Notes (Signed)
Primary care physician: Dr. Carlynn Purl  HPI  This is a 54 year old female who is here today for a followup visit regarding paroxysmal supraventricular tachycardia and recurrent chest pain. She was seen in 2013 for chest pain and palpitations. She underwent a treadmill stress test which showed no evidence of ischemia. However, at peak stress, she went into supraventricular tachycardia with a heart rate of 220 beats per minute which was brief and terminated spontaneously. She had a Holter monitor done which showed PVCs without other arrhythmia. Echocardiogram was normal.  Symptoms of tachycardia improved with metoprolol. She was also evaluated for an episode of syncope which was vasovagal in nature. She continues to have significant stress and anxiety. In April, she had a prolonged episode of chest pain which she described as sharp pain like a knife in the substernal area with no radiation. This lasted for about half an hour and happened at rest. It was associated with increased heartburn. She also reports episodes of difficulty swallowing. She had fewer milder episodes since then. She has chronic exertional dyspnea but denies chest tightness. She was found to have an abnormal EKG with anterior T wave changes. These seem to be new changes.   No Known Allergies   Current Outpatient Prescriptions on File Prior to Visit  Medication Sig Dispense Refill  . acetaminophen (TYLENOL) 500 MG chewable tablet Chew 500 mg by mouth every 6 (six) hours as needed.    . ALPRAZolam (XANAX) 0.5 MG tablet Take 0.5 mg by mouth as needed for anxiety.    Marland Kitchen aspirin 81 MG tablet Take 81 mg by mouth as needed.     . Cholecalciferol (VITAMIN D) 2000 UNITS tablet Take 2,000 Units by mouth daily.    Marland Kitchen escitalopram (LEXAPRO) 10 MG tablet Take 10 mg by mouth daily.    . hyoscyamine (LEVSIN) 0.125 MG tablet Take 0.125 mg by mouth as needed.    . Linaclotide (LINZESS) 290 MCG CAPS capsule Take 290 mcg by mouth daily.    .  metoprolol tartrate (LOPRESSOR) 25 MG tablet Take 1 tablet (25 mg total) by mouth 2 (two) times daily as needed (for fast heart beats). (Patient taking differently: Take 25 mg by mouth 2 (two) times daily. ) 60 tablet 6  . nitrofurantoin (MACRODANTIN) 100 MG capsule Take 100 mg by mouth at bedtime.    Marland Kitchen omeprazole (PRILOSEC) 20 MG capsule Take 20 mg by mouth daily.      No current facility-administered medications on file prior to visit.     Past Medical History  Diagnosis Date  . Unspecified vitamin D deficiency   . External hemorrhoids without mention of complication   . Lump or mass in breast   . Palpitations   . Diabetes mellitus     borderline?  Marland Kitchen PSVT (paroxysmal supraventricular tachycardia) 04/12/2012  . IBS (irritable bowel syndrome)   . Anxiety   . Heart palpitations      Past Surgical History  Procedure Laterality Date  . Laparoscopic total hysterectomy    . Breast biopsy Left 2013    core w/clip - neg     History reviewed. No pertinent family history.   History   Social History  . Marital Status: Divorced    Spouse Name: N/A  . Number of Children: N/A  . Years of Education: N/A   Occupational History  . Not on file.   Social History Main Topics  . Smoking status: Never Smoker   . Smokeless tobacco: Not on file  .  Alcohol Use: No  . Drug Use: No  . Sexual Activity: Not on file   Other Topics Concern  . Not on file   Social History Narrative      PHYSICAL EXAM   BP 90/62 mmHg  Pulse 60  Ht 5\' 4"  (1.626 m)  Wt 138 lb 12 oz (62.937 kg)  BMI 23.80 kg/m2 Constitutional: She is oriented to person, place, and time. She appears well-developed and well-nourished. No distress.  HENT: No nasal discharge.  Head: Normocephalic and atraumatic.  Eyes: Pupils are equal and round. Right eye exhibits no discharge. Left eye exhibits no discharge.  Neck: Normal range of motion. Neck supple. No JVD present. No thyromegaly present.  Cardiovascular: Normal  rate, regular rhythm, normal heart sounds. Exam reveals no gallop and no friction rub. No murmur heard.  Pulmonary/Chest: Effort normal and breath sounds normal. No stridor. No respiratory distress. She has no wheezes. She has no rales. She exhibits no tenderness.  Abdominal: Soft. Bowel sounds are normal. She exhibits no distension. There is no tenderness. There is no rebound and no guarding.  Musculoskeletal: Normal range of motion. She exhibits no edema and no tenderness.  Neurological: She is alert and oriented to person, place, and time. Coordination normal.  Skin: Skin is warm and dry. No rash noted. She is not diaphoretic. No erythema. No pallor.  Psychiatric: She has a normal mood and affect. Her behavior is normal. Judgment and thought content normal.   WUJ:[id number]:Sinus  Rhythm  -  Diffuse nonspecific T-abnormality.   ABNORMAL    ASSESSMENT AND PLAN

## 2015-01-12 NOTE — Assessment & Plan Note (Signed)
She reports significant improvement in episodes of tachycardia on metoprolol.

## 2015-01-12 NOTE — Assessment & Plan Note (Signed)
The patient's episodes of chest pain are somewhat atypical and seems to have a lot of GI features. However, her EKG is slightly different from before with anterior T wave changes. Due to that, I recommend evaluation with a pharmacologic nuclear stress test. During her previous treadmill stress test 3 years ago, she developed very fast SVT and thus I would like to avoid a treadmill stress test. She also has new abnormal EKG changes. If stress test is negative, I recommend further GI evaluation.

## 2015-01-12 NOTE — Patient Instructions (Signed)
Medication Instructions:  Your physician recommends that you continue on your current medications as directed. Please refer to the Current Medication list given to you today.   Labwork: none  Testing/Procedures: Your physician has requested that you have a lexiscan myoview.  [facility] MYOVIEW  Your caregiver has ordered a Stress Test with nuclear imaging. The purpose of this test is to evaluate the blood supply to your heart muscle. This procedure is referred to as a "Non-Invasive Stress Test." This is because other than having an IV started in your vein, nothing is inserted or "invades" your body. Cardiac stress tests are done to find areas of poor blood flow to the heart by determining the extent of coronary artery disease (CAD). Some patients exercise on a treadmill, which naturally increases the blood flow to your heart, while others who are  unable to walk on a treadmill due to physical limitations have a pharmacologic/chemical stress agent called Lexiscan . This medicine will mimic walking on a treadmill by temporarily increasing your coronary blood flow.   Please note: these test may take anywhere between 2-4 hours to complete  PLEASE REPORT TO [facility]TR[facility]  THE VOLUNTEERS AT THE FIRST DESK WILL DIRECT YOU WHERE TO GO  Date of Procedure:___________Monday, June 6, 8:30am______________________  Arrival Time for Procedure:_________7:30am______  Instructions regarding medication:     _xx___:  Hold betablocker(s) night before procedure and morning of procedure. Do not take metoprolol the morning of your procedure.    PLEASE NOTIFY THE OFFICE AT LEAST 24 HOURS IN ADVANCE IF YOU ARE UNABLE TO KEEP YOUR APPOINTMENT.  [phone] AND  PLEASE NOTIFY [facility] AT [facility] AT LEAST 24 HOURS IN ADVANCE IF YOU ARE UNABLE TO KEEP YOUR APPOINTMENT. [phone]  How to prepare for your Myoview test:  1. Do not eat or drink after midnight 2. No caffeine for 24 hours prior to  test 3. No smoking 24 hours prior to test. 4. Your medication may be taken with water.  If your doctor stopped a medication because of this test, do not take that medication. 5. Ladies, please do not wear dresses.  Skirts or pants are appropriate. Please wear a short sleeve shirt. 6. No perfume, cologne or lotion. 7. Wear comfortable walking shoes. No heels!    Follow-Up: Your physician wants you to follow-up in: six months with Dr. Kirke CorinArida.  You will receive a reminder letter in the mail two months in advance. If you don't receive a letter, please call our office to schedule the follow-up appointment.   Any Other Special Instructions Will Be Listed Below (If Applicable).

## 2015-01-18 ENCOUNTER — Encounter
Admission: RE | Admit: 2015-01-18 | Discharge: 2015-01-18 | Disposition: A | Discharge status: Home / Self Care | Payer: 59 | Source: Ambulatory Visit | Attending: Cardiovascular Disease | Admitting: Cardiovascular Disease

## 2015-01-18 DIAGNOSIS — R079 Chest pain, unspecified: Secondary | ICD-10-CM | POA: Insufficient documentation

## 2015-01-18 LAB — NM MYOCAR MULTI W/SPECT W/WALL MOTION / EF
CHL CUP NUCLEAR SDS: 1
CHL CUP STRESS STAGE 2 SPEED: 0 mph
CHL CUP STRESS STAGE 3 GRADE: 0 %
CHL CUP STRESS STAGE 3 HR: 52 {beats}/min
CHL CUP STRESS STAGE 3 SPEED: 0 mph
CHL CUP STRESS STAGE 4 HR: 79 {beats}/min
CHL CUP STRESS STAGE 6 HR: 87 {beats}/min
CHL CUP STRESS STAGE 6 SPEED: 0 mph
CSEPED: 0 min
CSEPEDS: 0 s
Estimated workload: 1 METS
LV dias vol: 75 mL
LV sys vol: 26 mL
MPHR: 167 {beats}/min
NUC STRESS TID: 1.02
Peak HR: 79 {beats}/min
Percent HR: 59 %
Percent of predicted max HR: 47 %
Rest HR: 52 {beats}/min
SRS: 1
SSS: 2
Stage 1 HR: 52 {beats}/min
Stage 2 Grade: 0 %
Stage 2 HR: 52 {beats}/min
Stage 4 Grade: 0 %
Stage 4 Speed: 0 mph
Stage 5 DBP: 69 mmHg
Stage 5 Grade: 0 %
Stage 5 HR: 98 {beats}/min
Stage 5 SBP: 123 mmHg
Stage 5 Speed: 0 mph
Stage 6 DBP: 69 mmHg
Stage 6 Grade: 0 %
Stage 6 SBP: 123 mmHg

## 2015-01-18 MED ORDER — REGADENOSON 0.4 MG/5ML IV SOLN
0.4000 mg | Freq: Once | INTRAVENOUS | Status: AC
  Administered 2015-01-18: 0.4 mg via INTRAVENOUS
  Filled 2015-01-18: qty 5

## 2015-01-18 MED ORDER — TECHNETIUM TC 99M SESTAMIBI - CARDIOLITE
30.0000 | Freq: Once | INTRAVENOUS | Status: AC | PRN
  Administered 2015-01-18: 30.03 via INTRAVENOUS

## 2015-01-18 MED ORDER — TECHNETIUM TC 99M SESTAMIBI - CARDIOLITE
13.8400 | Freq: Once | INTRAVENOUS | Status: AC | PRN
  Administered 2015-01-18: 08:00:00 13.84 via INTRAVENOUS

## 2015-03-01 ENCOUNTER — Ambulatory Visit (INDEPENDENT_AMBULATORY_CARE_PROVIDER_SITE_OTHER): Payer: 59 | Admitting: Cardiovascular Disease

## 2015-03-01 ENCOUNTER — Encounter: Payer: Self-pay | Admitting: Cardiovascular Disease

## 2015-03-01 VITALS — BP 103/69 | HR 59 | Ht 64.0 in | Wt 138.2 lb

## 2015-03-01 DIAGNOSIS — R0789 Other chest pain: Secondary | ICD-10-CM | POA: Diagnosis not present

## 2015-03-01 DIAGNOSIS — I471 Supraventricular tachycardia: Secondary | ICD-10-CM | POA: Diagnosis not present

## 2015-03-01 NOTE — Assessment & Plan Note (Signed)
No evidence of recurrent arrhythmia on small dose metoprolol.

## 2015-03-01 NOTE — Progress Notes (Signed)
Primary care physician: Dr. Carlynn PurlSowles  HPI  This is a 54 year old female who is here today for a followup visit regarding paroxysmal supraventricular tachycardia and recurrent chest pain. She was seen in 2013 for chest pain and palpitations. She underwent a treadmill stress test which showed no evidence of ischemia. However, at peak stress, she went into supraventricular tachycardia with a heart rate of 220 beats per minute which was brief and terminated spontaneously. She had a Holter monitor done which showed PVCs without other arrhythmia. Echocardiogram was normal.  Symptoms of tachycardia improved with metoprolol. She was also evaluated for an episode of syncope which was vasovagal in nature. Pharmacologic nuclear stress test was done in June of this year due to recurrent episodes of chest pain. It showed no evidence of ischemia with normal ejection fraction. Overall, she is feeling better. She denies recurrent chest pain. Biggest issue seems to be fatigue and stress at work.   No Known Allergies   Current Outpatient Prescriptions on File Prior to Visit  Medication Sig Dispense Refill  . acetaminophen (TYLENOL) 500 MG chewable tablet Chew 500 mg by mouth every 6 (six) hours as needed.    . ALPRAZolam (XANAX) 0.5 MG tablet Take 0.5 mg by mouth as needed for anxiety.    Marland Kitchen. aspirin 81 MG tablet Take 81 mg by mouth as needed.     . butalbital-acetaminophen-caffeine (FIORICET, ESGIC) 50-325-40 MG per tablet Take 1 tablet by mouth daily as needed for headache.    . Cholecalciferol (VITAMIN D) 2000 UNITS tablet Take 2,000 Units by mouth daily.    Marland Kitchen. escitalopram (LEXAPRO) 10 MG tablet Take 10 mg by mouth daily.    Marland Kitchen. estrogens, conjugated, (PREMARIN) 0.625 MG tablet Take 0.625 mg by mouth daily. Take daily for 21 days then do not take for 7 days.    . hyoscyamine (LEVSIN) 0.125 MG tablet Take 0.125 mg by mouth as needed.    . Linaclotide (LINZESS) 290 MCG CAPS capsule Take 290 mcg by mouth daily.      . metoprolol tartrate (LOPRESSOR) 25 MG tablet Take 1 tablet (25 mg total) by mouth 2 (two) times daily as needed (for fast heart beats). (Patient taking differently: Take 25 mg by mouth 2 (two) times daily. ) 60 tablet 6  . nitrofurantoin (MACRODANTIN) 100 MG capsule Take 100 mg by mouth at bedtime.    Marland Kitchen. omeprazole (PRILOSEC) 20 MG capsule Take 20 mg by mouth daily.     . Vitamin D, Ergocalciferol, (DRISDOL) 50000 UNITS CAPS capsule Take 50,000 Units by mouth every 7 (seven) days.     No current facility-administered medications on file prior to visit.     Past Medical History  Diagnosis Date  . Unspecified vitamin D deficiency   . External hemorrhoids without mention of complication   . Lump or mass in breast   . Palpitations   . Diabetes mellitus     borderline?  Marland Kitchen. PSVT (paroxysmal supraventricular tachycardia) 04/12/2012  . IBS (irritable bowel syndrome)   . Anxiety   . Heart palpitations      Past Surgical History  Procedure Laterality Date  . Laparoscopic total hysterectomy    . Breast biopsy Left 2013    core w/clip - neg  . Abdominal hysterectomy      2013     History reviewed. No pertinent family history.   History   Social History  . Marital Status: Divorced    Spouse Name: N/A  . Number of Children: N/A  .  Years of Education: N/A   Occupational History  . Not on file.   Social History Main Topics  . Smoking status: Never Smoker   . Smokeless tobacco: Not on file  . Alcohol Use: No  . Drug Use: No  . Sexual Activity: Not on file   Other Topics Concern  . Not on file   Social History Narrative      PHYSICAL EXAM   BP 103/69 mmHg  Pulse 59  Ht  (1.626 m)  Wt 138 lb 4 oz (62.71 kg)  BMI 23.72 kg/m2 Constitutional: She is oriented to person, place, and time. She appears well-developed and well-nourished. No distress.  HENT: No nasal discharge.  Head: Normocephalic and atraumatic.  Eyes: Pupils are equal and round. Right eye  exhibits no discharge. Left eye exhibits no discharge.  Neck: Normal range of motion. Neck supple. No JVD present. No thyromegaly present.  Cardiovascular: Normal rate, regular rhythm, normal heart sounds. Exam reveals no gallop and no friction rub. No murmur heard.  Pulmonary/Chest: Effort normal and breath sounds normal. No stridor. No respiratory distress. She has no wheezes. She has no rales. She exhibits no tenderness.  Abdominal: Soft. Bowel sounds are normal. She exhibits no distension. There is no tenderness. There is no rebound and no guarding.  Musculoskeletal: Normal range of motion. She exhibits no edema and no tenderness.  Neurological: She is alert and oriented to person, place, and time. Coordination normal.  Skin: Skin is warm and dry. No rash noted. She is not diaphoretic. No erythema. No pallor.  Psychiatric: She has a normal mood and affect. Her behavior is normal. Judgment and thought content normal.   [id number]  Bradycardia  -  Diffuse nonspecific T-abnormality.   ABNORMAL    ASSESSMENT AND PLAN

## 2015-03-01 NOTE — Patient Instructions (Signed)
Medication Instructions: Continue same medications.   Labwork: None.   Procedures/Testing: None.   Follow-Up: 1 year with Dr. Tsuruko Murtha  Any Additional Special Instructions Will Be Listed Below (If Applicable).   

## 2015-03-01 NOTE — Assessment & Plan Note (Signed)
Nuclear stress test showed no evidence of ischemia with normal ejection fraction. She reports resolution of chest pain.

## 2015-03-15 ENCOUNTER — Other Ambulatory Visit: Payer: Self-pay | Admitting: Family Medicine

## 2015-03-15 NOTE — Telephone Encounter (Signed)
Patient requesting refill. 

## 2015-03-20 ENCOUNTER — Encounter: Payer: Self-pay | Admitting: Family Medicine

## 2015-03-20 DIAGNOSIS — K589 Irritable bowel syndrome without diarrhea: Secondary | ICD-10-CM | POA: Insufficient documentation

## 2015-03-20 DIAGNOSIS — F411 Generalized anxiety disorder: Secondary | ICD-10-CM | POA: Insufficient documentation

## 2015-03-20 DIAGNOSIS — K219 Gastro-esophageal reflux disease without esophagitis: Secondary | ICD-10-CM | POA: Insufficient documentation

## 2015-03-20 DIAGNOSIS — K5641 Fecal impaction: Secondary | ICD-10-CM | POA: Insufficient documentation

## 2015-03-20 DIAGNOSIS — E559 Vitamin D deficiency, unspecified: Secondary | ICD-10-CM | POA: Insufficient documentation

## 2015-03-20 DIAGNOSIS — R001 Bradycardia, unspecified: Secondary | ICD-10-CM | POA: Insufficient documentation

## 2015-03-20 DIAGNOSIS — Z780 Asymptomatic menopausal state: Secondary | ICD-10-CM | POA: Insufficient documentation

## 2015-03-20 DIAGNOSIS — G44221 Chronic tension-type headache, intractable: Secondary | ICD-10-CM | POA: Insufficient documentation

## 2015-03-22 ENCOUNTER — Encounter: Payer: Self-pay | Admitting: Family Medicine

## 2015-03-22 ENCOUNTER — Ambulatory Visit: Payer: Self-pay | Admitting: Family Medicine

## 2015-03-22 ENCOUNTER — Ambulatory Visit (INDEPENDENT_AMBULATORY_CARE_PROVIDER_SITE_OTHER): Payer: 59 | Admitting: Family Medicine

## 2015-03-22 VITALS — BP 116/68 | HR 60 | Temp 97.7°F | Resp 18 | Ht 64.0 in | Wt 138.5 lb

## 2015-03-22 DIAGNOSIS — I471 Supraventricular tachycardia: Secondary | ICD-10-CM

## 2015-03-22 DIAGNOSIS — F411 Generalized anxiety disorder: Secondary | ICD-10-CM

## 2015-03-22 DIAGNOSIS — K219 Gastro-esophageal reflux disease without esophagitis: Secondary | ICD-10-CM

## 2015-03-22 DIAGNOSIS — K589 Irritable bowel syndrome without diarrhea: Secondary | ICD-10-CM

## 2015-03-22 DIAGNOSIS — G44221 Chronic tension-type headache, intractable: Secondary | ICD-10-CM

## 2015-03-22 MED ORDER — ACETAMINOPHEN-CODEINE #3 300-30 MG PO TABS: 1.0000 | ORAL_TABLET | ORAL | Status: DC | PRN

## 2015-03-22 NOTE — Progress Notes (Signed)
Name: Chelsea Decker   MRN: [id number]    DOB: 05-26-1961   Date:03/22/2015       Progress Note  Subjective  Chief Complaint  Chief Complaint  Patient presents with  . Medication Refill    3 month F/U  . Migraine    Having two weekly, unchanged. Starts in the front of patient head and goes to right side, medication helps.  . Constipation    Medication is helping, patient is going two to three times daily now. Patients states she stays Bloated but going to bathroom more regularly helps.  Karie Fetch Reflux    Acid taste in mouth but no vomiting, medication helps controlled symptoms.  . Anxiety    Unchanged.    HPI  Tension Headaches: she has episodes of headaches a couple of times week, pain is described as a throbbing or sharp sensation, and radiates to right side of neck, down to her right shoulder. No photophobia or phonophobia. Lasts about one hour.   IBS with constipation: symptoms have improved with Linzess, only taking prn because it was causing diarrhea.  GERD: under control with medication, no heartburn or regurgitation at this time  SVT: seen by Dr. Kirke Corin, stress test showed HR went up to 220 with maximum activity, advised to avoid caffeine, we will change headache medications. Doing better with metoprolol  Anxiety/Depression:  Patient Active Problem List   Diagnosis Date Noted  . Anxiety, generalized 03/20/2015  . Bradycardia 03/20/2015  . Chronic tension-type headache, intractable 03/20/2015  . Gastro-esophageal reflux disease without esophagitis 03/20/2015  . Personal history of perinatal problems 03/20/2015  . IBS (irritable bowel syndrome) 03/20/2015  . Menopause 03/20/2015  . Vitamin D deficiency 03/20/2015  . Vasovagal syncope 08/18/2014  . PSVT (paroxysmal supraventricular tachycardia) 04/12/2012  . Chest pain 01/12/2012  . Palpitations 01/12/2012  . Lump or mass in breast 04/01/2009  . External hemorrhoids 07/15/2007    Past Surgical History   Procedure Laterality Date  . Laparoscopic total hysterectomy    . Breast biopsy Left 2013    core w/clip - neg  . Abdominal hysterectomy      2013    Family History  Problem Relation Age of Onset  . Cancer Mother   . Diabetes Mother   . Cancer Father   . Diabetes Father     History   Social History  . Marital Status: Divorced    Spouse Name: N/A  . Number of Children: N/A  . Years of Education: N/A   Occupational History  . Not on file.   Social History Main Topics  . Smoking status: Never Smoker   . Smokeless tobacco: Never Used  . Alcohol Use: No  . Drug Use: No  . Sexual Activity: No   Other Topics Concern  . Not on file   Social History Narrative     Current outpatient prescriptions:  .  acetaminophen (TYLENOL) 500 MG chewable tablet, Chew 500 mg by mouth every 6 (six) hours as needed., Disp: , Rfl:  .  ALPRAZolam (XANAX) 0.5 MG tablet, Take 0.5 mg by mouth as needed for anxiety., Disp: , Rfl:  .  aspirin 81 MG tablet, Take 81 mg by mouth as needed. , Disp: , Rfl:  .  Cholecalciferol (VITAMIN D) 2000 UNITS tablet, Take 2,000 Units by mouth daily., Disp: , Rfl:  .  escitalopram (LEXAPRO) 10 MG tablet, Take 10 mg by mouth daily., Disp: , Rfl:  .  estrogens, conjugated, (PREMARIN) 0.625 MG tablet,  Take 0.625 mg by mouth daily. Take daily for 21 days then do not take for 7 days., Disp: , Rfl:  .  hyoscyamine (LEVSIN) 0.125 MG tablet, Take 0.125 mg by mouth as needed., Disp: , Rfl:  .  Linaclotide (LINZESS) 290 MCG CAPS capsule, Take 290 mcg by mouth daily., Disp: , Rfl:  .  metoprolol tartrate (LOPRESSOR) 25 MG tablet, Take 1 tablet (25 mg total) by mouth 2 (two) times daily as needed (for fast heart beats). (Patient taking differently: Take 25 mg by mouth 2 (two) times daily. ), Disp: 60 tablet, Rfl: 6 .  omeprazole (PRILOSEC) 20 MG capsule, Take 20 mg by mouth daily. , Disp: , Rfl:  .  Vitamin D, Ergocalciferol, (DRISDOL) 50000 UNITS CAPS capsule, Take 50,000  Units by mouth every 7 (seven) days., Disp: , Rfl:  .  acetaminophen-codeine (TYLENOL #3) 300-30 MG per tablet, Take 1 tablet by mouth every 4 (four) hours as needed for moderate pain., Disp: 30 tablet, Rfl: 0  No Known Allergies   ROS  Constitutional: Negative for fever or weight change.  Respiratory: Negative for cough and shortness of breath.   Cardiovascular: Negative for chest pain occasional  palpitations.  Gastrointestinal: Negative for abdominal pain, no bowel changes.  Musculoskeletal: Negative for gait problem or joint swelling.  Skin: Negative for rash.  Neurological: Negative for dizziness positive for  headache.  No other specific complaints in a complete review of systems (except as listed in HPI above).  Objective  Filed Vitals:   03/22/15 1622  BP: 116/68  Pulse: 60  Temp: 97.7 F (36.5 C)  TempSrc: Oral  Resp: 18  Height: 5\' 4"  (1.626 m)  Weight: 138 lb 8 oz (62.823 kg)  SpO2: 99%    Body mass index is 23.76 kg/(m^2).  Physical Exam  Constitutional: Patient appears well-developed and well-nourished.  No distress.  HEENT: head atraumatic, normocephalic, pupils equal and reactive to light, ears neck supple, throat within normal limits Cardiovascular: Normal rate, regular rhythm and normal heart sounds.  No murmur heard. No BLE edema. Pulmonary/Chest: Effort normal and breath sounds normal. No respiratory distress. Abdominal: Soft.  There is no tenderness. Psychiatric: Patient has a normal mood and affect. behavior is normal. Judgment and thought content normal.  Recent Results (from the past 2160 hour(s))  NM Myocar Multi W/Spect W/Wall Motion / EF     Status: None   Collection Time: 01/18/15  9:55 AM  Result Value Ref Range   Nuc Stress EF  %   Rest HR 52 bpm   Rest BP 113/69 mmHg   Exercise duration (min) 0 min   Exercise duration (sec) 0 sec   Estimated workload 1.0 METS   Post peak HR 79 BPM   Post peak BP  mmHg   MPHR 167 bpm   Percent HR 59  %   RPE     LV Systolic Volume 26 mL   TID 1.02    LV Diastolic Volume 75 mL   LHR     SSS 2    SRS 1    SDS 1    Phase 1 name PREINFSN    Stage 1 Name SUPINE    Stage 1 Time 00:00:01    Stage 1 HR 52 bpm   Phase 2 Name PREINFSN    Stage 2 Name HYPERV.    Stage 2 Time 00:04:29    Stage 2 Speed 0.0 mph   Stage 2 Grade 0.0 %   Stage 2  HR 52 bpm   Phase 3 Name Infusion    Stage 3 Name DOSE 1    Stage 3 Attribute Baseline    Stage 3 Time 00:00:00    Stage 3 Speed 0.0 mph   Stage 3 Grade 0.0 %   Stage 3 HR 52 bpm   Phase 4 Name Infusion    Stage 4 Name DOSE 1    Stage 4 Attribute Peak    Stage 4 Time 00:01:01    Stage 4 Speed 0.0 mph   Stage 4 Grade 0.0 %   Stage 4 HR 79 bpm   Phase 5 Name POSTINFSN    Stage 5 Attribute Recovery    Stage 5 Time 00:01:00    Stage 5 Speed 0.0 mph   Stage 5 Grade 0.0 %   Stage 5 HR 98 bpm   Stage 5 SBP 123 mmHg   Stage 5 DBP 69 mmHg   Phase 6 Name POSTINFSN    Stage 6 Time 00:04:15    Stage 6 Speed 0.0 mph   Stage 6 Grade 0.0 %   Stage 6 HR 87 bpm   Stage 6 SBP 123 mmHg   Stage 6 DBP 69 mmHg   Percent of predicted max HR 47 %     PHQ2/9: Depression screen PHQ 2/9 03/22/2015  Decreased Interest 0  Down, Depressed, Hopeless 0  PHQ - 2 Score 0     Fall Risk: Fall Risk  03/22/2015  Falls in the past year? No     Assessment & Plan  1. Gastro-esophageal reflux disease without esophagitis  Under controlled   2. PSVT (paroxysmal supraventricular tachycardia)  On Metoprolol   3. IBS (irritable bowel syndrome)  Doing better with medication   4. Anxiety, generalized  Still under a lot of stress at work, and even though she is taking medication for depression/anxiety she still feels overwhelmed.  Daughter would like for her to take a day off to rest. She states she will have to go to [employer] and is feeling overwhelmed about it, but she states taking time off will make her more stressed since she will have more  work when she goes back to work. Advised her to take one week off as recommended by Dr. Kirke Corin to control SVT and decrease her stress level, she will let me know when, since just taking a day off increases her stress  5. Chronic tension-type headache, intractable  - acetaminophen-codeine (TYLENOL #3) 300-30 MG per tablet; Take 1 tablet by mouth every 4 (four) hours as needed for moderate pain.  Dispense: 30 tablet; Refill: 0

## 2015-04-21 ENCOUNTER — Telehealth: Payer: Self-pay | Admitting: Family Medicine

## 2015-04-21 NOTE — Telephone Encounter (Signed)
Please call patient

## 2015-04-21 NOTE — Telephone Encounter (Signed)
Patient requesting a return call from Dr Carlynn Purl. States that it was some days that dr Carlynn Purl wanted her to take off and she just needed to confirm them. Please call on cell

## 2015-04-22 NOTE — Telephone Encounter (Signed)
Spoke with patient and she states you informed her to take a week off to decrease her stress as advised by Dr. Kirke Corin and Dr. Carlynn Purl. Patient would like a note for a week off September 19-23.

## 2015-04-22 NOTE — Telephone Encounter (Signed)
She will have to be seen either on the 16th or the 19th, to be able to write the note

## 2015-04-22 NOTE — Telephone Encounter (Signed)
Patient notified

## 2015-04-28 ENCOUNTER — Ambulatory Visit (INDEPENDENT_AMBULATORY_CARE_PROVIDER_SITE_OTHER): Payer: 59 | Admitting: Family Medicine

## 2015-04-28 ENCOUNTER — Encounter: Payer: Self-pay | Admitting: Family Medicine

## 2015-04-28 VITALS — BP 116/82 | HR 54 | Temp 97.4°F | Resp 14 | Ht 64.0 in | Wt 137.0 lb

## 2015-04-28 DIAGNOSIS — F411 Generalized anxiety disorder: Secondary | ICD-10-CM

## 2015-04-28 DIAGNOSIS — Z23 Encounter for immunization: Secondary | ICD-10-CM

## 2015-04-28 MED ORDER — ALPRAZOLAM ER 0.5 MG PO TB24: 0.5000 mg | ORAL_TABLET | Freq: Every day | ORAL | Status: DC

## 2015-04-28 NOTE — Progress Notes (Signed)
Name: Chelsea Decker   MRN: [id number]    DOB: [birth year]   Date:04/28/2015       Progress Note  Subjective  Chief Complaint  Chief Complaint  Patient presents with  . Follow-up  . Stress    needs note to be out of work for 1 week    HPI  GAD: she has a long history of anxiety and panic attacks, she is taking Lexapro and alprazolam 0.5 mg prn, but still has daily panic attacks, usually at work - described as right sided chest pain, SOB, and sensation of impending doom. She gets dizzy at work sometimes.  Feels overwhelmed at work. She has crying spells at work at times.  Her family members are worried about her, they feel like she needs a break from work to rest.  She feels like it is affecting her ability to perform well at work. 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  She also has been to [facility] for panic attacks in the past, secondary to stress at work   Patient Active Problem List   Diagnosis Date Noted  . Anxiety, generalized 03/20/2015  . Bradycardia 03/20/2015  . Chronic tension-type headache, intractable 03/20/2015  . Gastro-esophageal reflux disease without esophagitis 03/20/2015  . IBS (irritable bowel syndrome) 03/20/2015  . Menopause 03/20/2015  . Vitamin D deficiency 03/20/2015  . Vasovagal syncope 08/18/2014  . PSVT (paroxysmal supraventricular tachycardia) 04/12/2012  . Chest pain 01/12/2012  . Lump or mass in breast 04/01/2009  . External hemorrhoids 07/15/2007    Past Surgical History  Procedure Laterality Date  . Laparoscopic total hysterectomy    . Breast biopsy Left 2013    core w/clip - neg  . Abdominal hysterectomy      2013    Family History  Problem Relation Age of Onset  . Cancer Mother   . Diabetes Mother   . Cancer Father   . Diabetes Father     Social History   Social History  . Marital Status: Divorced    Spouse Name: N/A  . Number of Children: N/A  . Years of  Education: N/A   Occupational History  . Not on file.   Social History Main Topics  . Smoking status: Never Smoker   . Smokeless tobacco: Never Used  . Alcohol Use: No  . Drug Use: No  . Sexual Activity: No   Other Topics Concern  . Not on file   Social History Narrative     Current outpatient prescriptions:  .  acetaminophen (TYLENOL) 500 MG chewable tablet, Chew 500 mg by mouth every 6 (six) hours as needed., Disp: , Rfl:  .  acetaminophen-codeine (TYLENOL #3) 300-30 MG per tablet, Take 1 tablet by mouth every 4 (four) hours as needed for moderate pain., Disp: 30 tablet, Rfl: 0 .  ALPRAZolam (ALPRAZOLAM XR) 0.5 MG 24 hr tablet, Take 1 tablet (0.5 mg total) by mouth daily., Disp: 30 tablet, Rfl: 0 .  ALPRAZolam (XANAX) 0.5 MG tablet, Take 0.5 mg by mouth as needed for anxiety., Disp: , Rfl:  .  aspirin 81 MG tablet, Take 81 mg by mouth as needed. , Disp: , Rfl:  .  Cholecalciferol (VITAMIN D) 2000 UNITS tablet, Take 2,000 Units by mouth daily., Disp: , Rfl:  .  escitalopram (LEXAPRO) 10 MG tablet, Take 10 mg by mouth daily., Disp: , Rfl:  .  estrogens, conjugated, (PREMARIN) 0.625 MG tablet, Take 0.625  mg by mouth daily. Take daily for 21 days then do not take for 7 days., Disp: , Rfl:  .  hyoscyamine (LEVSIN) 0.125 MG tablet, Take 0.125 mg by mouth as needed., Disp: , Rfl:  .  Linaclotide (LINZESS) 290 MCG CAPS capsule, Take 290 mcg by mouth daily., Disp: , Rfl:  .  metoprolol tartrate (LOPRESSOR) 25 MG tablet, Take 1 tablet (25 mg total) by mouth 2 (two) times daily as needed (for fast heart beats). (Patient taking differently: Take 25 mg by mouth 2 (two) times daily. ), Disp: 60 tablet, Rfl: 6 .  omeprazole (PRILOSEC) 20 MG capsule, Take 20 mg by mouth daily. , Disp: , Rfl:  .  Vitamin D, Ergocalciferol, (DRISDOL) 50000 UNITS CAPS capsule, Take 50,000 Units by mouth every 7 (seven) days., Disp: , Rfl:   No Known Allergies   ROS  Constitutional: Negative for fever or weight  change.  Respiratory: Negative for cough and shortness of breath.   Cardiovascular: Positive  for chest pain or palpitations.  Gastrointestinal: Negative for abdominal pain, no bowel changes.  Musculoskeletal: Negative for gait problem or joint swelling.  Skin: Negative for rash.  Neurological: Occasional  dizziness and  headache.  No other specific complaints in a complete review of systems (except as listed in HPI above).  Objective  Filed Vitals:   04/28/15 0817  BP: 116/82  Pulse: 54  Temp: 97.4 F (36.3 C)  TempSrc: Oral  Resp: 14  Height:  (1.626 m)  Weight: 137 lb (62.143 kg)  SpO2: 99%    Body mass index is 23.5 kg/(m^2).  Physical Exam  Constitutional: Patient appears well-developed and well-nourished.  No distress.  HEENT: head atraumatic, normocephalic, pupils equal and reactive to light, , neck supple, throat within normal limits Cardiovascular: Normal rate, regular rhythm and normal heart sounds.  No murmur heard. No BLE edema. Pulmonary/Chest: Effort normal and breath sounds normal. No respiratory distress. Abdominal: Soft.  There is no tenderness. Psychiatric: Patient has a normal mood and affect. behavior is normal. Judgment and thought content normal.  PHQ2/9: Depression screen PHQ 2/9 03/22/2015  Decreased Interest 0  Down, Depressed, Hopeless 0  PHQ - 2 Score 0     Fall Risk: Fall Risk  03/22/2015  Falls in the past year? No     Assessment & Plan  1. Anxiety, generalized We will add Alprazolam XR, give her excuse for work for the next 10 days, advised her to rest, think about ways to control her stress, such as daily walk, learning a new skill. Advised her to stop worrying about what the other employees, and do the best she can t her job or try to find another job ( she is thinking about being a Comptroller ) I will give her an excuse from work from today and to return on September 26 th, 2016 Also advised her to contact HR and get her free  counseling sessions.  Over 50 % of time was spent in counseling  2. Needs flu shot  - Flu Vaccine QUAD 36+ mos PF IM (Fluarix & Fluzone Quad PF)

## 2015-05-03 ENCOUNTER — Ambulatory Visit: Payer: 59 | Admitting: Family Medicine

## 2015-05-05 ENCOUNTER — Ambulatory Visit (INDEPENDENT_AMBULATORY_CARE_PROVIDER_SITE_OTHER): Payer: 59 | Admitting: Family Medicine

## 2015-05-05 ENCOUNTER — Encounter: Payer: Self-pay | Admitting: Family Medicine

## 2015-05-05 VITALS — BP 118/68 | HR 50 | Temp 97.9°F | Resp 16 | Ht 64.0 in | Wt 133.3 lb

## 2015-05-05 DIAGNOSIS — F411 Generalized anxiety disorder: Secondary | ICD-10-CM

## 2015-05-05 NOTE — Progress Notes (Signed)
Name: Chelsea Decker   MRN: [id number]    DOB: 1961-01-28   Date:05/05/2015       Progress Note  Subjective  Chief Complaint  Chief Complaint  Patient presents with  . Paperwork    1 week F/U  . Depression    Patient states she has been walking daily and exercising at home, and relaxing. Patients states she has felt a little better.    HPI  GAD: she is out of work because of nervous break down last week, with panic attacks at work daily.  She did not get the Alprazolam XR yet. She has been taking time to rest. She is trying to find another job. Feeling overwhelmed at work with them being short staffed. Also has more cottages to clean lately and feels very tired at the end of the week. Memory test done today, failed to recall 4 items after 5 minutes and unable to do the series of 3 test.    Patient Active Problem List   Diagnosis Date Noted  . Anxiety, generalized 03/20/2015  . Bradycardia 03/20/2015  . Chronic tension-type headache, intractable 03/20/2015  . Gastro-esophageal reflux disease without esophagitis 03/20/2015  . IBS (irritable bowel syndrome) 03/20/2015  . Menopause 03/20/2015  . Vitamin D deficiency 03/20/2015  . Vasovagal syncope 08/18/2014  . PSVT (paroxysmal supraventricular tachycardia) 04/12/2012  . Chest pain 01/12/2012  . Lump or mass in breast 04/01/2009  . External hemorrhoids 07/15/2007    Past Surgical History  Procedure Laterality Date  . Laparoscopic total hysterectomy    . Breast biopsy Left 2013    core w/clip - neg  . Abdominal hysterectomy      2013    Family History  Problem Relation Age of Onset  . Cancer Mother   . Diabetes Mother   . Cancer Father   . Diabetes Father     Social History   Social History  . Marital Status: Divorced    Spouse Name: N/A  . Number of Children: N/A  . Years of Education: N/A   Occupational History  . Not on file.   Social History Main Topics  . Smoking status: Never Smoker   . Smokeless  tobacco: Never Used  . Alcohol Use: No  . Drug Use: No  . Sexual Activity: No   Other Topics Concern  . Not on file   Social History Narrative     Current outpatient prescriptions:  .  acetaminophen (TYLENOL) 500 MG chewable tablet, Chew 500 mg by mouth every 6 (six) hours as needed., Disp: , Rfl:  .  acetaminophen-codeine (TYLENOL #3) 300-30 MG per tablet, Take 1 tablet by mouth every 4 (four) hours as needed for moderate pain., Disp: 30 tablet, Rfl: 0 .  ALPRAZolam (ALPRAZOLAM XR) 0.5 MG 24 hr tablet, Take 1 tablet (0.5 mg total) by mouth daily., Disp: 30 tablet, Rfl: 0 .  ALPRAZolam (XANAX) 0.5 MG tablet, Take 0.5 mg by mouth as needed for anxiety., Disp: , Rfl:  .  aspirin 81 MG tablet, Take 81 mg by mouth as needed. , Disp: , Rfl:  .  Cholecalciferol (VITAMIN D) 2000 UNITS tablet, Take 2,000 Units by mouth daily., Disp: , Rfl:  .  escitalopram (LEXAPRO) 10 MG tablet, Take 10 mg by mouth daily., Disp: , Rfl:  .  estrogens, conjugated, (PREMARIN) 0.625 MG tablet, Take 0.625 mg by mouth daily. Take daily for 21 days then do not take for 7 days., Disp: , Rfl:  .  hyoscyamine (  LEVSIN) 0.125 MG tablet, Take 0.125 mg by mouth as needed., Disp: , Rfl:  .  Linaclotide (LINZESS) 290 MCG CAPS capsule, Take 290 mcg by mouth daily., Disp: , Rfl:  .  metoprolol tartrate (LOPRESSOR) 25 MG tablet, Take 1 tablet (25 mg total) by mouth 2 (two) times daily as needed (for fast heart beats). (Patient taking differently: Take 25 mg by mouth 2 (two) times daily. ), Disp: 60 tablet, Rfl: 6 .  omeprazole (PRILOSEC) 20 MG capsule, Take 20 mg by mouth daily. , Disp: , Rfl:  .  Vitamin D, Ergocalciferol, (DRISDOL) 50000 UNITS CAPS capsule, Take 50,000 Units by mouth every 7 (seven) days., Disp: , Rfl:   No Known Allergies   ROS  Constitutional: Negative for fever or weight change.  Respiratory: Negative for cough and shortness of breath.   Cardiovascular: Negative for chest pain or palpitations.   Gastrointestinal: Negative for abdominal pain, no bowel changes.  Musculoskeletal: Negative for gait problem or joint swelling.  Skin: Negative for rash.  Neurological: Negative for dizziness or headache.  No other specific complaints in a complete review of systems (except as listed in HPI above). Objective  Filed Vitals:   05/05/15 1050  BP: 118/68  Pulse: 50  Temp: 97.9 F (36.6 C)  TempSrc: Oral  Resp: 16  Height:  (1.626 m)  Weight: 133 lb 4.8 oz (60.464 kg)  SpO2: 98%    Body mass index is 22.87 kg/(m^2).  Physical Exam   Constitutional: Patient appears well-developed and well-nourished. No distress.  HEENT: head atraumatic, normocephalic, pupils equal and reactive to light,  neck supple, throat within normal limits Cardiovascular: Normal rate, regular rhythm and normal heart sounds.  No murmur heard. No BLE edema. Pulmonary/Chest: Effort normal and breath sounds normal. No respiratory distress. Abdominal: Soft.  There is no tenderness. Psychiatric: Patient has a normal mood and affect. behavior is normal. Judgment and thought content normal.  PHQ2/9: Depression screen PHQ 2/9 03/22/2015  Decreased Interest 0  Down, Depressed, Hopeless 0  PHQ - 2 Score 0    Fall Risk: Fall Risk  03/22/2015  Falls in the past year? No     Assessment & Plan  1. Anxiety, generalized  She has been off work for the past week, feeling less stressed, but is very concerned about going back. Overwhelmed. Trying to find another job, also advised to see HR and get counseling for free, and to get prescription of Alprazolam XR to start before going back to work. She had two panic attacks since off work, much better than daily. Also advised to discuss options with supervisor when she goes back to work.

## 2015-06-04 ENCOUNTER — Encounter: Payer: Self-pay | Admitting: Family Medicine

## 2015-06-04 ENCOUNTER — Ambulatory Visit (INDEPENDENT_AMBULATORY_CARE_PROVIDER_SITE_OTHER): Payer: 59 | Admitting: Family Medicine

## 2015-06-04 VITALS — BP 110/64 | HR 74 | Temp 97.7°F | Resp 14 | Ht 64.0 in | Wt 134.2 lb

## 2015-06-04 DIAGNOSIS — F411 Generalized anxiety disorder: Secondary | ICD-10-CM | POA: Diagnosis not present

## 2015-06-04 DIAGNOSIS — F410 Panic disorder [episodic paroxysmal anxiety] without agoraphobia: Secondary | ICD-10-CM

## 2015-06-04 MED ORDER — ALPRAZOLAM ER 0.5 MG PO TB24: 0.5000 mg | ORAL_TABLET | Freq: Every day | ORAL | Status: DC

## 2015-06-04 MED ORDER — ESCITALOPRAM OXALATE 10 MG PO TABS: 10.0000 mg | ORAL_TABLET | Freq: Every day | ORAL | Status: DC

## 2015-06-04 NOTE — Progress Notes (Signed)
Name: Chelsea Decker   MRN: [id number]    DOB: Sep 26, 1960   Date:06/04/2015       Progress Note  Subjective  Chief Complaint  Chief Complaint  Patient presents with  . Medication Refill    follow-up  . Hypertension    headaches  . Depression  . Gastroesophageal Reflux    HPI  GAD: Chelsea Decker had to take some time off work back in September because of severe anxiety associated with her work. 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  She has been back to work. 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 Therefore there was not much of improvement on her working environment. She states she has seen therapist through work once, and is going back next month. They are going to work on breathing techniques.  She states Lexapro and Alprazolam XR seems to hep her anxiety some, but still has panic attacks. Described as chest pain, palpitation, sensation of impending doom. It happens more frequently at work.    Patient Active Problem List   Diagnosis Date Noted  . Anxiety, generalized 03/20/2015  . Bradycardia 03/20/2015  . Chronic tension-type headache, intractable 03/20/2015  . Gastro-esophageal reflux disease without esophagitis 03/20/2015  . IBS (irritable bowel syndrome) 03/20/2015  . Menopause 03/20/2015  . Vitamin D deficiency 03/20/2015  . Vasovagal syncope 08/18/2014  . PSVT (paroxysmal supraventricular tachycardia) (HCC) 04/12/2012  . Chest pain 01/12/2012  . Lump or mass in breast 04/01/2009  . External hemorrhoids 07/15/2007    Past Surgical History  Procedure Laterality Date  . Laparoscopic total hysterectomy    . Breast biopsy Left 2013    core w/clip - neg  . Abdominal hysterectomy      2013    Family History  Problem Relation Age of Onset  . Cancer Mother   . Diabetes Mother   . Cancer Father   . Diabetes Father     Social History   Social History  . Marital Status:  Divorced    Spouse Name: N/A  . Number of Children: N/A  . Years of Education: N/A   Occupational History  . Not on file.   Social History Main Topics  . Smoking status: Never Smoker   . Smokeless tobacco: Never Used  . Alcohol Use: No  . Drug Use: No  . Sexual Activity: No   Other Topics Concern  . Not on file   Social History Narrative     Current outpatient prescriptions:  .  acetaminophen (TYLENOL) 500 MG chewable tablet, Chew 500 mg by mouth every 6 (six) hours as needed., Disp: , Rfl:  .  acetaminophen-codeine (TYLENOL #3) 300-30 MG per tablet, Take 1 tablet by mouth every 4 (four) hours as needed for moderate pain., Disp: 30 tablet, Rfl: 0 .  ALPRAZolam (ALPRAZOLAM XR) 0.5 MG 24 hr tablet, Take 1 tablet (0.5 mg total) by mouth daily., Disp: 30 tablet, Rfl: 1 .  ALPRAZolam (XANAX) 0.5 MG tablet, Take 0.5 mg by mouth as needed for anxiety., Disp: , Rfl:  .  aspirin 81 MG tablet, Take 81 mg by mouth as needed. , Disp: , Rfl:  .  Cholecalciferol (VITAMIN D) 2000 UNITS tablet, Take 2,000 Units by mouth daily., Disp: , Rfl:  .  escitalopram (LEXAPRO) 10 MG tablet, Take 1 tablet (10 mg total) by mouth daily., Disp: 30 tablet, Rfl: 1 .  estrogens, conjugated, (PREMARIN) 0.625  MG tablet, Take 0.625 mg by mouth daily. Take daily for 21 days then do not take for 7 days., Disp: , Rfl:  .  hyoscyamine (LEVSIN) 0.125 MG tablet, Take 0.125 mg by mouth as needed., Disp: , Rfl:  .  Linaclotide (LINZESS) 290 MCG CAPS capsule, Take 290 mcg by mouth daily., Disp: , Rfl:  .  metoprolol tartrate (LOPRESSOR) 25 MG tablet, Take 1 tablet (25 mg total) by mouth 2 (two) times daily as needed (for fast heart beats). (Patient taking differently: Take 25 mg by mouth 2 (two) times daily. ), Disp: 60 tablet, Rfl: 6 .  omeprazole (PRILOSEC) 20 MG capsule, Take 20 mg by mouth daily. , Disp: , Rfl:  .  Vitamin D, Ergocalciferol, (DRISDOL) 50000 UNITS CAPS capsule, Take 50,000 Units by mouth every 7 (seven)  days., Disp: , Rfl:   No Known Allergies   ROS  Ten systems reviewed and is negative except as mentioned in HPI    Objective  Filed Vitals:   06/04/15 1609  BP: 110/64  Pulse: 74  Temp: 97.7 F (36.5 C)  TempSrc: Oral  Resp: 14  Height:  (1.626 m)  Weight: 134 lb 3.2 oz (60.873 kg)  SpO2: 98%    Body mass index is 23.02 kg/(m^2).  Physical Exam  Constitutional: Patient appears well-developed and well-nourished. No distress.  HEENT: head atraumatic, normocephalic, pupils equal and reactive to light, eneck supple, throat within normal limits Cardiovascular: Normal rate, regular rhythm and normal heart sounds.  No murmur heard. No BLE edema. Pulmonary/Chest: Effort normal and breath sounds normal. No respiratory distress. Abdominal: Soft.  There is no tenderness. Psychiatric: Patient has a normal mood and affect. behavior is normal. Judgment and thought content normal.   PHQ2/9: Depression screen [facility] 2/9 06/04/2015 03/22/2015  Decreased Interest 0 0  Down, Depressed, Hopeless 0 0  PHQ - 2 Score 0 0     Fall Risk: Fall Risk  06/04/2015 03/22/2015  Falls in the past year? No No    Functional Status Survey: Is the patient deaf or have difficulty hearing?: No Does the patient have difficulty seeing, even when wearing glasses/contacts?: Yes (glasses) Does the patient have difficulty concentrating, remembering, or making decisions?: No Does the patient have difficulty walking or climbing stairs?: No Does the patient have difficulty dressing or bathing?: No Does the patient have difficulty doing errands alone such as visiting a doctor's office or shopping?: No    Assessment & Plan  1. Anxiety, generalized  Continue medication and therapy visits. Seems more calm today.  - escitalopram (LEXAPRO) 10 MG tablet; Take 1 tablet (10 mg total) by mouth daily.  Dispense: 30 tablet; Refill: 1 - ALPRAZolam (ALPRAZOLAM XR) 0.5 MG 24 hr tablet; Take 1 tablet (0.5 mg total)  by mouth daily.  Dispense: 30 tablet; Refill: 1  2. Panic attack  - escitalopram (LEXAPRO) 10 MG tablet; Take 1 tablet (10 mg total) by mouth daily.  Dispense: 30 tablet; Refill: 1 - ALPRAZolam (ALPRAZOLAM XR) 0.5 MG 24 hr tablet; Take 1 tablet (0.5 mg total) by mouth daily.  Dispense: 30 tablet; Refill: 1

## 2015-06-28 ENCOUNTER — Other Ambulatory Visit: Payer: Self-pay | Admitting: Family Medicine

## 2015-06-28 NOTE — Telephone Encounter (Signed)
Patient requesting refill. 

## 2015-08-05 ENCOUNTER — Ambulatory Visit: Payer: 59 | Admitting: Family Medicine

## 2015-08-06 ENCOUNTER — Telehealth: Payer: Self-pay | Admitting: Family Medicine

## 2015-08-06 NOTE — Telephone Encounter (Signed)
yes

## 2015-08-06 NOTE — Telephone Encounter (Signed)
Patient notified that she would be able to drink in moderation.

## 2015-08-06 NOTE — Telephone Encounter (Signed)
Due to the holidays, patient would like to know if it is okay for her to have champaign during the christmas and new year holiday celebration. She said it not be excessive and that she was going out with friends.

## 2015-08-10 ENCOUNTER — Encounter: Payer: Self-pay | Admitting: Family Medicine

## 2015-08-10 ENCOUNTER — Ambulatory Visit (INDEPENDENT_AMBULATORY_CARE_PROVIDER_SITE_OTHER): Payer: 59 | Admitting: Family Medicine

## 2015-08-10 VITALS — BP 108/60 | HR 74 | Temp 98.0°F | Resp 16 | Ht 64.0 in | Wt 130.1 lb

## 2015-08-10 DIAGNOSIS — K589 Irritable bowel syndrome without diarrhea: Secondary | ICD-10-CM | POA: Diagnosis not present

## 2015-08-10 DIAGNOSIS — I471 Supraventricular tachycardia, unspecified: Secondary | ICD-10-CM

## 2015-08-10 DIAGNOSIS — E559 Vitamin D deficiency, unspecified: Secondary | ICD-10-CM | POA: Diagnosis not present

## 2015-08-10 DIAGNOSIS — J069 Acute upper respiratory infection, unspecified: Secondary | ICD-10-CM | POA: Diagnosis not present

## 2015-08-10 DIAGNOSIS — R7301 Impaired fasting glucose: Secondary | ICD-10-CM

## 2015-08-10 DIAGNOSIS — Z13220 Encounter for screening for lipoid disorders: Secondary | ICD-10-CM | POA: Diagnosis not present

## 2015-08-10 DIAGNOSIS — F411 Generalized anxiety disorder: Secondary | ICD-10-CM

## 2015-08-10 DIAGNOSIS — Z79899 Other long term (current) drug therapy: Secondary | ICD-10-CM | POA: Diagnosis not present

## 2015-08-10 DIAGNOSIS — F410 Panic disorder [episodic paroxysmal anxiety] without agoraphobia: Secondary | ICD-10-CM | POA: Diagnosis not present

## 2015-08-10 DIAGNOSIS — K219 Gastro-esophageal reflux disease without esophagitis: Secondary | ICD-10-CM

## 2015-08-10 MED ORDER — LINACLOTIDE 290 MCG PO CAPS: 290.0000 ug | ORAL_CAPSULE | Freq: Every day | ORAL | Status: DC

## 2015-08-10 MED ORDER — ESCITALOPRAM OXALATE 10 MG PO TABS: 10.0000 mg | ORAL_TABLET | Freq: Every day | ORAL | Status: DC

## 2015-08-10 MED ORDER — ALPRAZOLAM ER 0.5 MG PO TB24: 0.5000 mg | ORAL_TABLET | Freq: Every day | ORAL | Status: DC

## 2015-08-10 MED ORDER — BENZONATATE 100 MG PO CAPS: 100.0000 mg | ORAL_CAPSULE | Freq: Three times a day (TID) | ORAL | Status: DC | PRN

## 2015-08-10 NOTE — Progress Notes (Signed)
Name: Chelsea Decker   MRN: [id number]    DOB: [birth year]   Date:08/10/2015       Progress Note  Subjective  Chief Complaint  Chief Complaint  Patient presents with  . Medication Refill    follow-up 3 month  . Gastroesophageal Reflux  . Constipation  . Anxiety  . URI    onset 1 day and worsening cough, congestion and sore throat    HPI   Tension Headaches: she has episodes of headaches  pain is described as a throbbing or sharp sensation, and radiates to right side of neck, down to her right shoulder. No photophobia or phonophobia. Lasts about one hour. Still has about 2 episodes for week   IBS with constipation: symptoms have improved with Linzess, only taking prn because it was causing diarrhea. At most a couple of times of week  GERD: under control with medication, no heartburn or regurgitation at this time, denies side effects of medication. Discussed risk of c.difi colitis and Alzheimer's  SVT: seen by Dr. Kirke Corin, stress test showed HR went up to 220 with maximum activity, advised to avoid caffeine, we will change headache medications. Doing better with metoprolol   GAD: Demiya had to take some time off work back in September because of severe anxiety associated with her work. 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 She has been back to work. 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 Therefore there was not much of improvement on her working environment. She states she has seen therapist through work and has one session per month. She states it seems that therapy is helping her symptoms also. She states Lexapro and Alprazolam XR seems to hep her anxiety some, but still has panic attacks. Described as chest pain, palpitation, sensation of impending doom. Episodes of panic attack are down to two times week, usually at work   URI: symptoms started yesterday, with rhinorrhea, sore throat,  post-nasal drainage, and a productive cough, no fever, no chills, normal appetite.   Patient Active Problem List   Diagnosis Date Noted  . Fasting hyperglycemia 08/10/2015  . Anxiety, generalized 03/20/2015  . Bradycardia 03/20/2015  . Chronic tension-type headache, intractable 03/20/2015  . Gastro-esophageal reflux disease without esophagitis 03/20/2015  . IBS (irritable bowel syndrome) 03/20/2015  . Menopause 03/20/2015  . Vitamin D deficiency 03/20/2015  . Vasovagal syncope 08/18/2014  . PSVT (paroxysmal supraventricular tachycardia) (HCC) 04/12/2012  . Chest pain 01/12/2012  . Lump or mass in breast 04/01/2009  . External hemorrhoids 07/15/2007    Past Surgical History  Procedure Laterality Date  . Laparoscopic total hysterectomy    . Breast biopsy Left 2013    core w/clip - neg  . Abdominal hysterectomy      2013    Family History  Problem Relation Age of Onset  . Cancer Mother   . Diabetes Mother   . Cancer Father   . Diabetes Father     Social History   Social History  . Marital Status: Divorced    Spouse Name: N/A  . Number of Children: N/A  . Years of Education: N/A   Occupational History  . Not on file.   Social History Main Topics  . Smoking status: Never Smoker   . Smokeless tobacco: Never Used  . Alcohol Use: No  . Drug Use: No  . Sexual Activity: No   Other Topics Concern  .  Not on file   Social History Narrative     Current outpatient prescriptions:  .  acetaminophen (TYLENOL) 500 MG chewable tablet, Chew 500 mg by mouth every 6 (six) hours as needed., Disp: , Rfl:  .  ALPRAZolam (XANAX XR) 0.5 MG 24 hr tablet, Take 1 tablet (0.5 mg total) by mouth daily., Disp: 30 tablet, Rfl: 2 .  ALPRAZolam (XANAX) 0.5 MG tablet, Take 0.5 mg by mouth as needed for anxiety., Disp: , Rfl:  .  aspirin 81 MG tablet, Take 81 mg by mouth as needed. , Disp: , Rfl:  .  Cholecalciferol (VITAMIN D) 2000 UNITS tablet, Take 2,000 Units by mouth daily., Disp: ,  Rfl:  .  escitalopram (LEXAPRO) 10 MG tablet, Take 1 tablet (10 mg total) by mouth daily., Disp: 30 tablet, Rfl: 2 .  estrogens, conjugated, (PREMARIN) 0.625 MG tablet, Take 0.625 mg by mouth daily. Take daily for 21 days then do not take for 7 days., Disp: , Rfl:  .  hyoscyamine (LEVSIN) 0.125 MG tablet, Take 0.125 mg by mouth as needed., Disp: , Rfl:  .  Linaclotide (LINZESS) 290 MCG CAPS capsule, Take 1 capsule (290 mcg total) by mouth daily., Disp: 30 capsule, Rfl: 5 .  metoprolol tartrate (LOPRESSOR) 25 MG tablet, Take 1 tablet (25 mg total) by mouth 2 (two) times daily as needed (for fast heart beats). (Patient taking differently: Take 25 mg by mouth 2 (two) times daily. ), Disp: 60 tablet, Rfl: 6 .  omeprazole (PRILOSEC) 20 MG capsule, TAKE 1 CAPSULE BY MOUTH ONCE DAILY FOR CHEST PAIN, Disp: 30 capsule, Rfl: 5  No Known Allergies   ROS  Ten systems reviewed and is negative except as mentioned in HPI   Objective  Filed Vitals:   08/10/15 0902  BP: 108/60  Pulse: 74  Temp: 98 F (36.7 C)  TempSrc: Oral  Resp: 16  Height:  (1.626 m)  Weight: 130 lb 1.6 oz (59.013 kg)  SpO2: 97%    Body mass index is 22.32 kg/(m^2).  Physical Exam  Constitutional: Patient appears well-developed and well-nourished.  No distress.  HEENT: head atraumatic, normocephalic, pupils equal and reactive to light, ears TM normal  neck supple, throat within normal limits, clear rhinorrhea and boggy turbinates Cardiovascular: Normal rate, regular rhythm and normal heart sounds.  No murmur heard. No BLE edema. Pulmonary/Chest: Effort normal and breath sounds normal. No respiratory distress. Abdominal: Soft.  There is no tenderness. Psychiatric: Patient has a normal mood and affect. behavior is normal. Judgment and thought content normal.  PHQ2/9: Depression screen [facility] 2/9 08/10/2015 06/04/2015 03/22/2015  Decreased Interest 0 0 0  Down, Depressed, Hopeless 0 0 0  PHQ - 2 Score 0 0 0    Fall  Risk: Fall Risk  08/10/2015 06/04/2015 03/22/2015  Falls in the past year? No No No    Functional Status Survey: Is the patient deaf or have difficulty hearing?: No Does the patient have difficulty seeing, even when wearing glasses/contacts?: Yes (glasses) Does the patient have difficulty concentrating, remembering, or making decisions?: No Does the patient have difficulty walking or climbing stairs?: No Does the patient have difficulty dressing or bathing?: No Does the patient have difficulty doing errands alone such as visiting a doctor's office or shopping?: No    Assessment & Plan  1. Anxiety, generalized  Doing better, continue therapist, still has panic attacks, but does not seem to be as frequent - escitalopram (LEXAPRO) 10 MG tablet; Take 1 tablet (10  mg total) by mouth daily.  Dispense: 30 tablet; Refill: 2 - ALPRAZolam (XANAX XR) 0.5 MG 24 hr tablet; Take 1 tablet (0.5 mg total) by mouth daily.  Dispense: 30 tablet; Refill: 2  2. IBS (irritable bowel syndrome)  - Linaclotide (LINZESS) 290 MCG CAPS capsule; Take 1 capsule (290 mcg total) by mouth daily.  Dispense: 30 capsule; Refill: 5  3. Gastro-esophageal reflux disease without esophagitis  Under control   4. PSVT (paroxysmal supraventricular tachycardia) (HCC)  Still has about two episodes per day , lasts seconds and resolves by itself, continue medication  5. Vitamin D deficiency  - VITAMIN D 25 Hydroxy (Vit-D Deficiency, Fractures)  6. Fasting hyperglycemia  - Hemoglobin A1c  7. Lipid screening  - Lipid panel  8. Long-term use of high-risk medication  - Comprehensive metabolic panel  9. Panic attack  Continue prn Xanax also  - escitalopram (LEXAPRO) 10 MG tablet; Take 1 tablet (10 mg total) by mouth daily.  Dispense: 30 tablet; Refill: 2  10. Upper respiratory infection   - benzonatate (TESSALON) 100 MG capsule; Take 1-2 capsules (100-200 mg total) by mouth 3 (three) times daily as needed for  cough.  Dispense: 40 capsule; Refill: 0

## 2015-08-12 ENCOUNTER — Ambulatory Visit: Payer: 59 | Admitting: Family Medicine

## 2015-11-05 ENCOUNTER — Ambulatory Visit (INDEPENDENT_AMBULATORY_CARE_PROVIDER_SITE_OTHER): Payer: 59 | Admitting: Family Medicine

## 2015-11-05 ENCOUNTER — Encounter: Payer: Self-pay | Admitting: Family Medicine

## 2015-11-05 VITALS — BP 126/64 | HR 67 | Temp 97.8°F | Resp 16 | Ht 64.0 in | Wt 128.4 lb

## 2015-11-05 DIAGNOSIS — Z79899 Other long term (current) drug therapy: Secondary | ICD-10-CM | POA: Diagnosis not present

## 2015-11-05 DIAGNOSIS — G44221 Chronic tension-type headache, intractable: Secondary | ICD-10-CM | POA: Diagnosis not present

## 2015-11-05 DIAGNOSIS — K219 Gastro-esophageal reflux disease without esophagitis: Secondary | ICD-10-CM | POA: Diagnosis not present

## 2015-11-05 DIAGNOSIS — I471 Supraventricular tachycardia, unspecified: Secondary | ICD-10-CM

## 2015-11-05 DIAGNOSIS — E559 Vitamin D deficiency, unspecified: Secondary | ICD-10-CM | POA: Diagnosis not present

## 2015-11-05 DIAGNOSIS — Z79890 Hormone replacement therapy (postmenopausal): Secondary | ICD-10-CM | POA: Diagnosis not present

## 2015-11-05 DIAGNOSIS — R7301 Impaired fasting glucose: Secondary | ICD-10-CM | POA: Diagnosis not present

## 2015-11-05 DIAGNOSIS — F410 Panic disorder [episodic paroxysmal anxiety] without agoraphobia: Secondary | ICD-10-CM | POA: Diagnosis not present

## 2015-11-05 DIAGNOSIS — Z13220 Encounter for screening for lipoid disorders: Secondary | ICD-10-CM | POA: Diagnosis not present

## 2015-11-05 DIAGNOSIS — F411 Generalized anxiety disorder: Secondary | ICD-10-CM

## 2015-11-05 MED ORDER — AMITRIPTYLINE HCL 10 MG PO TABS: 10.0000 mg | ORAL_TABLET | Freq: Every day | ORAL | Status: DC

## 2015-11-05 MED ORDER — ESTROGENS CONJUGATED 0.625 MG PO TABS: 0.6250 mg | ORAL_TABLET | Freq: Every day | ORAL | Status: DC

## 2015-11-05 MED ORDER — METOPROLOL TARTRATE 25 MG PO TABS: 25.0000 mg | ORAL_TABLET | Freq: Two times a day (BID) | ORAL | Status: DC

## 2015-11-05 NOTE — Progress Notes (Signed)
Name: Chelsea Decker   MRN: [id number]    DOB: Dec 02, 1960   Date:11/05/2015       Progress Note  Subjective  Chief Complaint  Chief Complaint  Patient presents with  . Medication Refill    3 month F/U  . Anxiety    Unchanged, will have panic attacks occasionally with work situations due to them changing her schedule.  . Gastroesophageal Reflux    Well controlled  . Irritable Bowel Syndrome    Going to bathroom two times a week with Linzess   . Hypertension    Headaches    HPI  Tension Headaches: she has episodes of headaches pain is described as a throbbing or sharp sensation, and radiates to right side of neck, sometimes on left side. No photophobia or phonophobia. Lasts about one hour. Still about two or three times weekly and she takes prn medication, but would like to prevent symptoms     IBS with constipation: symptoms have improved with Linzess, only taking prn because it was causing diarrhea. Taking medication about twice weekly, and has bowel movements at least 3 times weekly, only feel bloated at times.   GERD: under control with medication, no heartburn or regurgitation at this time, denies side effects of medication. Discussed risk of c.difi colitis and Alzheimer's  SVT: seen by Dr. Kirke CorinArida, stress test showed HR went up to 220 with maximum activity, advised to avoid caffeine.Doing better with metoprolol  GAD: Chelsea NoeVanessa had to take some time off work back in September because of severe anxiety associated with her work. 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 She has been back to work. 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 Therefore there was not much of improvement on her working environment. She states she has seen therapist through work and has one session per month. She states it seems that therapy is helping her symptoms also. She states Lexapro and Alprazolam XR seems to  hep her anxiety some,  still has panic attacks but not as often, at most twice weekly. Described as chest pain, palpitation, sensation of impending doom.   Hot flashes: she is taking Premarin, discussed again [facility] studies.    Patient Active Problem List   Diagnosis Date Noted  . Post-menopause on HRT (hormone replacement therapy) 11/05/2015  . Fasting hyperglycemia 08/10/2015  . Anxiety, generalized 03/20/2015  . Bradycardia 03/20/2015  . Chronic tension-type headache, intractable 03/20/2015  . Gastro-esophageal reflux disease without esophagitis 03/20/2015  . IBS (irritable bowel syndrome) 03/20/2015  . Menopause 03/20/2015  . Vitamin D deficiency 03/20/2015  . Vasovagal syncope 08/18/2014  . PSVT (paroxysmal supraventricular tachycardia) (HCC) 04/12/2012  . Chest pain 01/12/2012  . Lump or mass in breast 04/01/2009  . External hemorrhoids 07/15/2007    Past Surgical History  Procedure Laterality Date  . Laparoscopic total hysterectomy    . Breast biopsy Left 2013    core w/clip - neg  . Abdominal hysterectomy      2013    Family History  Problem Relation Age of Onset  . Cancer Mother   . Diabetes Mother   . Cancer Father   . Diabetes Father     Social History   Social History  . Marital Status: Divorced    Spouse Name: N/A  . Number of Children: N/A  . Years of Education: N/A   Occupational History  . Not on file.  Social History Main Topics  . Smoking status: Never Smoker   . Smokeless tobacco: Never Used  . Alcohol Use: No  . Drug Use: No  . Sexual Activity: No   Other Topics Concern  . Not on file   Social History Narrative     Current outpatient prescriptions:  .  acetaminophen (TYLENOL) 500 MG chewable tablet, Chew 500 mg by mouth every 6 (six) hours as needed., Disp: , Rfl:  .  ALPRAZolam (XANAX XR) 0.5 MG 24 hr tablet, Take 1 tablet (0.5 mg total) by mouth daily., Disp: 30 tablet, Rfl: 2 .  ALPRAZolam (XANAX) 0.5 MG tablet, Take 0.5 mg by  mouth as needed for anxiety., Disp: , Rfl:  .  aspirin 81 MG tablet, Take 81 mg by mouth as needed. , Disp: , Rfl:  .  Cholecalciferol (VITAMIN D) 2000 UNITS tablet, Take 2,000 Units by mouth daily., Disp: , Rfl:  .  escitalopram (LEXAPRO) 10 MG tablet, Take 1 tablet (10 mg total) by mouth daily., Disp: 30 tablet, Rfl: 2 .  estrogens, conjugated, (PREMARIN) 0.625 MG tablet, Take 1 tablet (0.625 mg total) by mouth daily., Disp: 30 tablet, Rfl: 12 .  hyoscyamine (LEVSIN) 0.125 MG tablet, Take 0.125 mg by mouth as needed., Disp: , Rfl:  .  Linaclotide (LINZESS) 290 MCG CAPS capsule, Take 1 capsule (290 mcg total) by mouth daily., Disp: 30 capsule, Rfl: 5 .  metoprolol tartrate (LOPRESSOR) 25 MG tablet, Take 1 tablet (25 mg total) by mouth 2 (two) times daily., Disp: 60 tablet, Rfl: 5 .  omeprazole (PRILOSEC) 20 MG capsule, TAKE 1 CAPSULE BY MOUTH ONCE DAILY FOR CHEST PAIN, Disp: 30 capsule, Rfl: 5 .  amitriptyline (ELAVIL) 10 MG tablet, Take 1 tablet (10 mg total) by mouth at bedtime., Disp: 30 tablet, Rfl: 5  No Known Allergies   ROS  Constitutional: Negative for fever or weight change.  Respiratory: Negative for cough and shortness of breath.   Cardiovascular: Negative for chest pain or palpitations.  Gastrointestinal: Negative for abdominal pain, no bowel changes.  Musculoskeletal: Negative for gait problem or joint swelling.  Skin: Negative for rash.  Neurological: Negative for dizziness , positive for  headache.  No other specific complaints in a complete review of systems (except as listed in HPI above).  Objective  Filed Vitals:   11/05/15 0947  BP: 126/64  Pulse: 67  Temp: 97.8 F (36.6 C)  TempSrc: Oral  Resp: 16  Height:  (1.626 m)  Weight: 128 lb 6.4 oz (58.242 kg)  SpO2: 94%    Body mass index is 22.03 kg/(m^2).  Physical Exam  Constitutional: Patient appears well-developed and well-nourished.  No distress.  HEENT: head atraumatic, normocephalic, pupils  equal and reactive to light,  neck supple, throat within normal limits Cardiovascular: Normal rate, regular rhythm and normal heart sounds.  No murmur heard. No BLE edema. Pulmonary/Chest: Effort normal and breath sounds normal. No respiratory distress. Abdominal: Soft.  There is no tenderness. Psychiatric: Patient has a normal mood and affect. behavior is normal. Judgment and thought content normal.   PHQ2/9: Depression screen [facility] 2/9 11/05/2015 08/10/2015 06/04/2015 03/22/2015  Decreased Interest 0 0 0 0  Down, Depressed, Hopeless 0 0 0 0  PHQ - 2 Score 0 0 0 0    Fall Risk: Fall Risk  11/05/2015 08/10/2015 06/04/2015 03/22/2015  Falls in the past year? No No No No    Functional Status Survey: Is the patient deaf or have difficulty hearing?: No  Does the patient have difficulty seeing, even when wearing glasses/contacts?: No Does the patient have difficulty concentrating, remembering, or making decisions?: No Does the patient have difficulty walking or climbing stairs?: No Does the patient have difficulty dressing or bathing?: No Does the patient have difficulty doing errands alone such as visiting a doctor's office or shopping?: No   Assessment & Plan  1. Anxiety, generalized  Continue medication and therapy   2. Panic attack  Episodes less frequent now  3. Gastro-esophageal reflux disease without esophagitis  Continue Omeprazole  4. PSVT (paroxysmal supraventricular tachycardia) (HCC)  - metoprolol tartrate (LOPRESSOR) 25 MG tablet; Take 1 tablet (25 mg total) by mouth 2 (two) times daily.  Dispense: 60 tablet; Refill: 5  5. Chronic tension-type headache, intractable  We will try Elavil  - amitriptyline (ELAVIL) 10 MG tablet; Take 1 tablet (10 mg total) by mouth at bedtime.  Dispense: 30 tablet; Refill: 5  6. Post-menopause on HRT (hormone replacement therapy)  - estrogens, conjugated, (PREMARIN) 0.625 MG tablet; Take 1 tablet (0.625 mg total) by mouth daily.   Dispense: 30 tablet; Refill: 12

## 2015-11-06 LAB — LIPID PANEL
CHOL/HDL RATIO: 2.9 ratio (ref 0.0–4.4)
Cholesterol, Total: 231 mg/dL — ABNORMAL HIGH (ref 100–199)
HDL: 80 mg/dL (ref >39)
LDL Calculated: 141 mg/dL — ABNORMAL HIGH (ref 0–99)
TRIGLYCERIDES: 50 mg/dL (ref 0–149)
VLDL Cholesterol Cal: 10 mg/dL (ref 5–40)

## 2015-11-06 LAB — COMPREHENSIVE METABOLIC PANEL
A/G RATIO: 1.4 (ref 1.2–2.2)
ALT: 9 IU/L (ref 0–32)
AST: 17 IU/L (ref 0–40)
Albumin: 4.5 g/dL (ref 3.5–5.5)
Alkaline Phosphatase: 72 IU/L (ref 39–117)
BUN/Creatinine Ratio: 18 (ref 9–23)
BUN: 13 mg/dL (ref 6–24)
Bilirubin Total: 0.4 mg/dL (ref 0.0–1.2)
CO2: 23 mmol/L (ref 18–29)
Calcium: 10.2 mg/dL (ref 8.7–10.2)
Chloride: 101 mmol/L (ref 96–106)
Creatinine, Ser: 0.73 mg/dL (ref 0.57–1.00)
GFR calc non Af Amer: 94 mL/min/{1.73_m2} (ref >59)
GFR, EST AFRICAN AMERICAN: 108 mL/min/{1.73_m2} (ref >59)
GLOBULIN, TOTAL: 3.3 g/dL (ref 1.5–4.5)
Glucose: 88 mg/dL (ref 65–99)
POTASSIUM: 4.3 mmol/L (ref 3.5–5.2)
Sodium: 140 mmol/L (ref 134–144)
Total Protein: 7.8 g/dL (ref 6.0–8.5)

## 2015-11-06 LAB — HEMOGLOBIN A1C
Est. average glucose Bld gHb Est-mCnc: 117 mg/dL
Hgb A1c MFr Bld: 5.7 % — ABNORMAL HIGH (ref 4.8–5.6)

## 2015-11-06 LAB — VITAMIN D 25 HYDROXY (VIT D DEFICIENCY, FRACTURES): VIT D 25 HYDROXY: 37.2 ng/mL (ref 30.0–100.0)

## 2015-11-08 ENCOUNTER — Ambulatory Visit: Payer: 59 | Admitting: Family Medicine

## 2015-11-24 ENCOUNTER — Other Ambulatory Visit: Payer: Self-pay | Admitting: Family Medicine

## 2015-11-24 DIAGNOSIS — Z1231 Encounter for screening mammogram for malignant neoplasm of breast: Secondary | ICD-10-CM

## 2016-01-28 ENCOUNTER — Other Ambulatory Visit: Payer: Self-pay | Admitting: Family Medicine

## 2016-01-28 ENCOUNTER — Ambulatory Visit
Admission: RE | Admit: 2016-01-28 | Discharge: 2016-01-28 | Disposition: A | Discharge status: Home / Self Care | Payer: 59 | Source: Ambulatory Visit | Attending: Family Medicine | Admitting: Family Medicine

## 2016-01-28 DIAGNOSIS — Z1231 Encounter for screening mammogram for malignant neoplasm of breast: Secondary | ICD-10-CM | POA: Insufficient documentation

## 2016-02-11 ENCOUNTER — Other Ambulatory Visit: Payer: Self-pay | Admitting: Family Medicine

## 2016-02-14 ENCOUNTER — Other Ambulatory Visit: Payer: Self-pay

## 2016-02-14 DIAGNOSIS — F411 Generalized anxiety disorder: Secondary | ICD-10-CM

## 2016-02-14 DIAGNOSIS — F410 Panic disorder [episodic paroxysmal anxiety] without agoraphobia: Secondary | ICD-10-CM

## 2016-02-14 DIAGNOSIS — Z79890 Hormone replacement therapy (postmenopausal): Secondary | ICD-10-CM

## 2016-02-14 NOTE — Telephone Encounter (Signed)
Patient requesting refill. 

## 2016-02-14 NOTE — Telephone Encounter (Signed)
Refill request was sent to Dr. Krichna Sowles for approval and submission.  

## 2016-02-15 MED ORDER — ESCITALOPRAM OXALATE 10 MG PO TABS: 10.0000 mg | ORAL_TABLET | Freq: Every day | ORAL | Status: DC

## 2016-02-15 MED ORDER — ESTROGENS CONJUGATED 0.625 MG PO TABS: 0.6250 mg | ORAL_TABLET | Freq: Every day | ORAL | Status: DC

## 2016-02-18 ENCOUNTER — Encounter: Payer: Self-pay | Admitting: Family Medicine

## 2016-02-18 ENCOUNTER — Ambulatory Visit (INDEPENDENT_AMBULATORY_CARE_PROVIDER_SITE_OTHER): Payer: 59 | Admitting: Family Medicine

## 2016-02-18 VITALS — BP 114/62 | HR 64 | Temp 98.1°F | Resp 16 | Ht 64.0 in | Wt 132.6 lb

## 2016-02-18 DIAGNOSIS — K589 Irritable bowel syndrome without diarrhea: Secondary | ICD-10-CM | POA: Diagnosis not present

## 2016-02-18 DIAGNOSIS — G44221 Chronic tension-type headache, intractable: Secondary | ICD-10-CM | POA: Diagnosis not present

## 2016-02-18 DIAGNOSIS — R7301 Impaired fasting glucose: Secondary | ICD-10-CM | POA: Diagnosis not present

## 2016-02-18 DIAGNOSIS — Z79890 Hormone replacement therapy (postmenopausal): Secondary | ICD-10-CM | POA: Diagnosis not present

## 2016-02-18 DIAGNOSIS — F410 Panic disorder [episodic paroxysmal anxiety] without agoraphobia: Secondary | ICD-10-CM | POA: Diagnosis not present

## 2016-02-18 DIAGNOSIS — K219 Gastro-esophageal reflux disease without esophagitis: Secondary | ICD-10-CM

## 2016-02-18 DIAGNOSIS — F411 Generalized anxiety disorder: Secondary | ICD-10-CM | POA: Diagnosis not present

## 2016-02-18 MED ORDER — ALPRAZOLAM ER 0.5 MG PO TB24: 0.5000 mg | ORAL_TABLET | Freq: Every day | ORAL | Status: DC

## 2016-02-18 MED ORDER — ESCITALOPRAM OXALATE 10 MG PO TABS: 10.0000 mg | ORAL_TABLET | Freq: Every day | ORAL | Status: DC

## 2016-02-18 MED ORDER — ESTROGENS CONJUGATED 0.625 MG PO TABS: 0.6250 mg | ORAL_TABLET | Freq: Every day | ORAL | Status: DC

## 2016-02-18 NOTE — Progress Notes (Signed)
Name: Chelsea Decker   MRN: [id number]    DOB: 1960-12-24   Date:02/18/2016       Progress Note  Subjective  Chief Complaint  Chief Complaint  Patient presents with  . Medication Refill  . Anxiety  . Gastroesophageal Reflux    HPI  Tension Headaches: she has episodes of headaches pain is described as a throbbing or sharp sensation, and radiates to right side of neck, sometimes on left side. No photophobia or phonophobia. Lasts about one hour. Down to one to two  times weekly and she takes prn medication. She was given Elavil to take qhs but has not been taking it every night, advised to take it as prescribed  IBS with constipation: symptoms have improved with Linzess, only taking prn because it was causing diarrhea. Taking medication about twice weekly, and has bowel movements at least 3 times weekly, only feel bloated at times. Taking Levsin prn only. Discussed decreasing dose to avoid diarrhea as a side effect  GERD: under control with medication, no heartburn or regurgitation at this time, denies side effects of medication. Discussed risk of c.difi colitis and Alzheimer's, she has been unable to stop medication   SVT: seen by Dr. Kirke CorinArida, stress test showed HR went up to 220 with maximum activity, advised to avoid caffeine. Doing better with metoprolol  GAD: Chelsea Decker had to take some time off work back in September because of severe anxiety associated with her work. 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 Therefore there was not much of improvement on her working environment. She states she has seen therapist through work and has one session per month. She states it seems that therapy is helping her symptoms also. She states Lexapro and Alprazolam XR seems to help  - but not taking as prescribed still has panic  attacks but not as often, at most twice weekly. Described as chest pain, palpitation, sensation of impending doom.   Hot flashes: she is taking Premarin, discussed again [facility] studies.    Patient Active Problem List   Diagnosis Date Noted  . Post-menopause on HRT (hormone replacement therapy) 11/05/2015  . Fasting hyperglycemia 08/10/2015  . Anxiety, generalized 03/20/2015  . Bradycardia 03/20/2015  . Chronic tension-type headache, intractable 03/20/2015  . Gastro-esophageal reflux disease without esophagitis 03/20/2015  . IBS (irritable bowel syndrome) 03/20/2015  . Menopause 03/20/2015  . Vitamin D deficiency 03/20/2015  . Vasovagal syncope 08/18/2014  . PSVT (paroxysmal supraventricular tachycardia) (HCC) 04/12/2012  . Lump or mass in breast 04/01/2009  . External hemorrhoids 07/15/2007    Past Surgical History  Procedure Laterality Date  . Laparoscopic total hysterectomy    . Breast biopsy Left 2013    core w/clip - neg  . Abdominal hysterectomy      2013    Family History  Problem Relation Age of Onset  . Cancer Mother   . Diabetes Mother   . Cancer Father   . Diabetes Father     Social History   Social History  . Marital Status: Divorced    Spouse Name: N/A  . Number of Children: N/A  . Years of Education: N/A   Occupational History  . Not on file.   Social History Main Topics  . Smoking status: Never Smoker   . Smokeless tobacco: Never  Used  . Alcohol Use: No  . Drug Use: No  . Sexual Activity: No   Other Topics Concern  . Not on file   Social History Narrative     Current outpatient prescriptions:  .  acetaminophen (TYLENOL) 500 MG chewable tablet, Chew 500 mg by mouth every 6 (six) hours as needed., Disp: , Rfl:  .  ALPRAZolam (XANAX XR) 0.5 MG 24 hr tablet, Take 1 tablet (0.5 mg total) by mouth daily., Disp: 30 tablet, Rfl: 2 .  amitriptyline (ELAVIL) 10 MG tablet, Take 1 tablet (10 mg total) by mouth at bedtime., Disp: 30 tablet, Rfl: 5 .   aspirin 81 MG tablet, Take 81 mg by mouth as needed. , Disp: , Rfl:  .  Cholecalciferol (VITAMIN D) 2000 UNITS tablet, Take 2,000 Units by mouth daily., Disp: , Rfl:  .  escitalopram (LEXAPRO) 10 MG tablet, Take 1 tablet (10 mg total) by mouth daily., Disp: 30 tablet, Rfl: 2 .  estrogens, conjugated, (PREMARIN) 0.625 MG tablet, Take 1 tablet (0.625 mg total) by mouth daily., Disp: 30 tablet, Rfl: 12 .  hyoscyamine (LEVSIN) 0.125 MG tablet, Take 0.125 mg by mouth as needed., Disp: , Rfl:  .  Linaclotide (LINZESS) 290 MCG CAPS capsule, Take 1 capsule (290 mcg total) by mouth daily., Disp: 30 capsule, Rfl: 5 .  metoprolol tartrate (LOPRESSOR) 25 MG tablet, Take 1 tablet (25 mg total) by mouth 2 (two) times daily., Disp: 60 tablet, Rfl: 5 .  omeprazole (PRILOSEC) 20 MG capsule, TAKE 1 CAPSULE BY MOUTH ONCE DAILY FOR CHEST PAIN, Disp: 30 capsule, Rfl: 5  No Known Allergies   ROS  Constitutional: Negative for fever or weight change.  Respiratory: Negative for cough and shortness of breath.   Cardiovascular: Negative for chest pain or palpitations.  Gastrointestinal: Negative for abdominal pain, no bowel changes.  Musculoskeletal: Negative for gait problem or joint swelling.  Skin: Negative for rash.  Neurological: Negative for dizziness or headache.  No other specific complaints in a complete review of systems (except as listed in HPI above).  Objective  Filed Vitals:   02/18/16 1446  BP: 114/62  Pulse: 64  Temp: 98.1 F (36.7 C)  TempSrc: Oral  Resp: 16  Height:  (1.626 m)  Weight: 132 lb 9.6 oz (60.147 kg)  SpO2: 97%    Body mass index is 22.75 kg/(m^2).  Physical Exam   Constitutional: Patient appears well-developed and well-nourished.  No distress.  HEENT: head atraumatic, normocephalic, pupils equal and reactive to light, e neck supple, throat within normal limits Cardiovascular: Normal rate, regular rhythm and normal heart sounds.  No murmur heard. No BLE  edema. Pulmonary/Chest: Effort normal and breath sounds normal. No respiratory distress. Abdominal: Soft.  There is no tenderness. Psychiatric: Patient has a normal mood and affect. behavior is normal. Judgment and thought content normal.    PHQ2/9: Depression screen [facility] 2/9 02/18/2016 11/05/2015 08/10/2015 06/04/2015 03/22/2015  Decreased Interest 0 0 0 0 0  Down, Depressed, Hopeless 0 0 0 0 0  PHQ - 2 Score 0 0 0 0 0    Fall Risk: Fall Risk  02/18/2016 11/05/2015 08/10/2015 06/04/2015 03/22/2015  Falls in the past year? No No No No No    Functional Status Survey: Is the patient deaf or have difficulty hearing?: No Does the patient have difficulty seeing, even when wearing glasses/contacts?: No Does the patient have difficulty concentrating, remembering, or making decisions?: No Does the patient have difficulty walking or climbing stairs?:  No Does the patient have difficulty dressing or bathing?: No Does the patient have difficulty doing errands alone such as visiting a doctor's office or shopping?: No    Assessment & Plan  1. Anxiety, generalized  Looked at her bottles and rx had not been filled since December. Explained importance of compliance with medication to get the desired benefits from prescriptios - escitalopram (LEXAPRO) 10 MG tablet; Take 1 tablet (10 mg total) by mouth daily.  Dispense: 30 tablet; Refill: 2 - ALPRAZolam (XANAX XR) 0.5 MG 24 hr tablet; Take 1 tablet (0.5 mg total) by mouth daily.  Dispense: 30 tablet; Refill: 2  2. Panic attack  - escitalopram (LEXAPRO) 10 MG tablet; Take 1 tablet (10 mg total) by mouth daily.  Dispense: 30 tablet; Refill: 2  3. Gastro-esophageal reflux disease without esophagitis  continue medication  4. IBS (irritable bowel syndrome)  Doing well at this time, on prn medication   5. Chronic tension-type headache, intractable  She is taking medication prn and is doing well  6. Fasting hyperglycemia  Last hgbA1C 5.7%,  discussed low sugar diet. No polydipsia, polyuria or polyphagia  7. Post-menopause on HRT (hormone replacement therapy)  - estrogens, conjugated, (PREMARIN) 0.625 MG tablet; Take 1 tablet (0.625 mg total) by mouth daily.  Dispense: 30 tablet; Refill: 12

## 2016-02-29 ENCOUNTER — Encounter: Payer: Self-pay | Admitting: Cardiovascular Disease

## 2016-02-29 ENCOUNTER — Ambulatory Visit (INDEPENDENT_AMBULATORY_CARE_PROVIDER_SITE_OTHER): Payer: 59 | Admitting: Cardiovascular Disease

## 2016-02-29 VITALS — BP 100/70 | HR 60 | Ht 64.0 in | Wt 132.8 lb

## 2016-02-29 DIAGNOSIS — I471 Supraventricular tachycardia: Secondary | ICD-10-CM | POA: Diagnosis not present

## 2016-02-29 MED ORDER — METOPROLOL TARTRATE 25 MG PO TABS: 25.0000 mg | ORAL_TABLET | Freq: Two times a day (BID) | ORAL | Status: DC

## 2016-02-29 NOTE — Patient Instructions (Signed)
Medication Instructions: Continue same medications.   Labwork: None.   Procedures/Testing: None.   Follow-Up: 1 year with Dr. Linas Stepter.   Any Additional Special Instructions Will Be Listed Below (If Applicable).     If you need a refill on your cardiac medications before your next appointment, please call your pharmacy.   

## 2016-02-29 NOTE — Progress Notes (Signed)
Cardiology Office Note   Date:  02/29/2016   ID:  Chelsea LotVanessa M Lantz, DOB 21-Jan-1961, MRN [id number]  PCP:  Ruel FavorsKrichna F Sowles, MD  Cardiologist:   Lorine BearsMuhammad Jaece Ducharme, MD   Chief Complaint  Patient presents with  . Follow-up    routine check up, no sx      History of Present Illness: Chelsea Decker is a 55 y.o. female who presents for a followup visit regarding paroxysmal supraventricular tachycardia and recurrent chest pain. She was seen in 2013 for chest pain and palpitations. She underwent a treadmill stress test which showed no evidence of ischemia. However, at peak stress, she went into supraventricular tachycardia with a heart rate of 220 beats per minute which was brief and terminated spontaneously. She had a Holter monitor done which showed PVCs without other arrhythmia. Echocardiogram was normal.  Symptoms of tachycardia improved with metoprolol. She was also evaluated for an episode of syncope which was vasovagal in nature. Pharmacologic nuclear stress test was done in June of 2016 due to recurrent episodes of chest pain. It showed no evidence of ischemia with normal ejection fraction.  She is doing well overall with no recurrent chest pain. She has occasional palpitations especially when she is anxious and under stress. Her symptoms seem to be controlled with current dose of metoprolol. She cut down on caffeine intake significantly.     Past Medical History  Diagnosis Date  . Unspecified vitamin D deficiency   . External hemorrhoids without mention of complication   . Lump or mass in breast   . Palpitations   . PSVT (paroxysmal supraventricular tachycardia) (HCC) 04/12/2012  . IBS (irritable bowel syndrome)   . Anxiety   . Heart palpitations   . GERD (gastroesophageal reflux disease)     Past Surgical History  Procedure Laterality Date  . Laparoscopic total hysterectomy    . Breast biopsy Left 2013    core w/clip - neg  . Abdominal hysterectomy      2013      Current Outpatient Prescriptions  Medication Sig Dispense Refill  . acetaminophen (TYLENOL) 500 MG chewable tablet Chew 500 mg by mouth every 6 (six) hours as needed.    . ALPRAZolam (XANAX XR) 0.5 MG 24 hr tablet Take 1 tablet (0.5 mg total) by mouth daily. 30 tablet 2  . amitriptyline (ELAVIL) 10 MG tablet Take 1 tablet (10 mg total) by mouth at bedtime. 30 tablet 5  . aspirin 81 MG tablet Take 81 mg by mouth as needed.     . Cholecalciferol (VITAMIN D) 2000 UNITS tablet Take 2,000 Units by mouth daily.    Marland Kitchen. escitalopram (LEXAPRO) 10 MG tablet Take 1 tablet (10 mg total) by mouth daily. 30 tablet 2  . estrogens, conjugated, (PREMARIN) 0.625 MG tablet Take 1 tablet (0.625 mg total) by mouth daily. 30 tablet 12  . hyoscyamine (LEVSIN) 0.125 MG tablet Take 0.125 mg by mouth as needed.    . Linaclotide (LINZESS) 290 MCG CAPS capsule Take 1 capsule (290 mcg total) by mouth daily. 30 capsule 5  . metoprolol tartrate (LOPRESSOR) 25 MG tablet Take 1 tablet (25 mg total) by mouth 2 (two) times daily. 60 tablet 6  . omeprazole (PRILOSEC) 20 MG capsule TAKE 1 CAPSULE BY MOUTH ONCE DAILY FOR CHEST PAIN 30 capsule 5   No current facility-administered medications for this visit.    Allergies:   Review of patient's allergies indicates no known allergies.    Social History:  The  patient  reports that she has never smoked. She has never used smokeless tobacco. She reports that she does not drink alcohol or use illicit drugs.   Family History:  The patient's family history includes Cancer in her father and mother; Diabetes in her father and mother.    ROS:  Please see the history of present illness.   Otherwise, review of systems are positive for none.   All other systems are reviewed and negative.    PHYSICAL EXAM: VS:  BP 100/70 mmHg  Pulse 60  Ht  (1.626 m)  Wt 132 lb 12.8 oz (60.238 kg)  BMI 22.78 kg/m2  SpO2 96% , BMI Body mass index is 22.78 kg/(m^2). GEN: Well nourished, well  developed, in no acute distress HEENT: normal Neck: no JVD, carotid bruits, or masses Cardiac: RRR; no murmurs, rubs, or gallops,no edema  Respiratory:  clear to auscultation bilaterally, normal work of breathing GI: soft, nontender, nondistended, + BS MS: no deformity or atrophy Skin: warm and dry, no rash Neuro:  Strength and sensation are intact Psych: euthymic mood, full affect   EKG:  EKG is ordered today. The ekg ordered today demonstrates sinus bradycardia with nonspecific T wave changes.   Recent Labs: 11/05/2015: ALT 9; BUN 13; Creatinine, Ser 0.73; Potassium 4.3; Sodium 140    Lipid Panel    Component Value Date/Time   CHOL 231* 11/05/2015 1054   TRIG 50 11/05/2015 1054   HDL 80 11/05/2015 1054   CHOLHDL 2.9 11/05/2015 1054   LDLCALC 141* 11/05/2015 1054      Wt Readings from Last 3 Encounters:  02/29/16 132 lb 12.8 oz (60.238 kg)  02/18/16 132 lb 9.6 oz (60.147 kg)  11/05/15 128 lb 6.4 oz (58.242 kg)        ASSESSMENT AND PLAN:  1.  Paroxysmal supraventricular tachycardia: Symptoms are well-controlled with current dose of metoprolol. She has cut down on caffeine intake significantly. Continue same medications and follow-up on a yearly basis.  2. Chest pain with negative stress test in the past: No recurrent symptoms.    Disposition:   FU with me in 1 year  Signed,  Lorine Bears, MD  02/29/2016 6:14 PM    [facility]

## 2016-05-23 ENCOUNTER — Ambulatory Visit (INDEPENDENT_AMBULATORY_CARE_PROVIDER_SITE_OTHER): Payer: 59 | Admitting: Family Medicine

## 2016-05-23 ENCOUNTER — Encounter: Payer: Self-pay | Admitting: Family Medicine

## 2016-05-23 VITALS — BP 114/62 | HR 75 | Temp 98.1°F | Wt 133.2 lb

## 2016-05-23 DIAGNOSIS — Z79890 Hormone replacement therapy (postmenopausal): Secondary | ICD-10-CM | POA: Diagnosis not present

## 2016-05-23 DIAGNOSIS — K219 Gastro-esophageal reflux disease without esophagitis: Secondary | ICD-10-CM | POA: Diagnosis not present

## 2016-05-23 DIAGNOSIS — Z23 Encounter for immunization: Secondary | ICD-10-CM

## 2016-05-23 DIAGNOSIS — K581 Irritable bowel syndrome with constipation: Secondary | ICD-10-CM | POA: Diagnosis not present

## 2016-05-23 DIAGNOSIS — G44221 Chronic tension-type headache, intractable: Secondary | ICD-10-CM

## 2016-05-23 DIAGNOSIS — F411 Generalized anxiety disorder: Secondary | ICD-10-CM

## 2016-05-23 DIAGNOSIS — F410 Panic disorder [episodic paroxysmal anxiety] without agoraphobia: Secondary | ICD-10-CM

## 2016-05-23 MED ORDER — LINACLOTIDE 72 MCG PO CAPS: 290.0000 ug | ORAL_CAPSULE | Freq: Every day | ORAL | 2 refills | Status: DC

## 2016-05-23 MED ORDER — LINACLOTIDE 72 MCG PO CAPS: 72.0000 ug | ORAL_CAPSULE | Freq: Every day | ORAL | 2 refills | Status: DC

## 2016-05-23 MED ORDER — ALPRAZOLAM ER 0.5 MG PO TB24: 0.5000 mg | ORAL_TABLET | Freq: Every day | ORAL | 2 refills | Status: DC

## 2016-05-23 MED ORDER — ESCITALOPRAM OXALATE 10 MG PO TABS: 10.0000 mg | ORAL_TABLET | Freq: Every day | ORAL | 2 refills | Status: DC

## 2016-05-23 NOTE — Progress Notes (Signed)
Name: Chelsea Decker   MRN: [id number]    DOB: 1961-07-29   Date:05/23/2016       Progress Note  Subjective  Chief Complaint  Chief Complaint  Patient presents with  . Follow-up    HPI  Tension Headaches: she has episodes of headaches pain is described as a throbbing or sharp sensation, and radiates to right side of neck, sometimes on left side. No photophobia or phonophobia. Lasts about one hour. Down to one to two times weekly and she takes prn medication. She was given Elavil to take qhs but has not been taking it every night but episodes have been sporadic, so advised her to stop Elavil  IBS with constipation: symptoms have improved with Linzess, only taking prn because it was causing diarrhea. We will decrease dose to 72 mg and monitor   GERD: under control with medication, no heartburn or regurgitation at this time, denies side effects of medication. Discussed risk of c.difi colitis and Alzheimer's, she has stopped the medication and is doing well at this time, occasionally taking Zantac or Tums otc  SVT: seen by Dr. Kirke Corin, stress test showed HR went up to 220 with maximum activity, advised to avoid caffeine. Doing better with metoprolol, no palpitation or SOB, very seldom has chest discomfort.  GAD: Chelsea Decker had to take some time off work back in September because of severe anxiety associated with her work. 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 She has been back to work. 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 She is up again on 9 cottages ad 11 apartment. She states she has seen therapist through work and is now only going on a prn basis.  She states Lexapro and Alprazolam XR seems to help, panic attacks are less often now, coping better with stress - episodes still about twice week.Marland Kitchen Described as chest pain, palpitation, sensation of impending doom. She thinks that she has  more work than her co-workers. Explained that she should not compare herself with other.    Hot flashes: she is taking Premarin, discussed again [facility] studies. She has been out of Premarin and hot flashes are severe, explained refills were sent to pharmacy in July.    Patient Active Problem List   Diagnosis Date Noted  . Post-menopause on HRT (hormone replacement therapy) 11/05/2015  . Fasting hyperglycemia 08/10/2015  . Anxiety, generalized 03/20/2015  . Bradycardia 03/20/2015  . Chronic tension-type headache, intractable 03/20/2015  . Gastro-esophageal reflux disease without esophagitis 03/20/2015  . IBS (irritable bowel syndrome) 03/20/2015  . Menopause 03/20/2015  . Vitamin D deficiency 03/20/2015  . Vasovagal syncope 08/18/2014  . PSVT (paroxysmal supraventricular tachycardia) (HCC) 04/12/2012  . Lump or mass in breast 04/01/2009  . External hemorrhoids 07/15/2007    Past Surgical History:  Procedure Laterality Date  . ABDOMINAL HYSTERECTOMY     2013  . BREAST BIOPSY Left 2013   core w/clip - neg  . LAPAROSCOPIC TOTAL HYSTERECTOMY      Family History  Problem Relation Age of Onset  . Cancer Mother   . Diabetes Mother   . Cancer Father   . Diabetes Father     Social History   Social History  . Marital status: Divorced    Spouse name: N/A  . Number of children: N/A  . Years of education: N/A   Occupational History  . Not on file.  Social History Main Topics  . Smoking status: Never Smoker  . Smokeless tobacco: Never Used  . Alcohol use No  . Drug use: No  . Sexual activity: No   Other Topics Concern  . Not on file   Social History Narrative  . No narrative on file     Current Outpatient Prescriptions:  .  acetaminophen (TYLENOL) 500 MG chewable tablet, Chew 500 mg by mouth every 6 (six) hours as needed., Disp: , Rfl:  .  ALPRAZolam (XANAX XR) 0.5 MG 24 hr tablet, Take 1 tablet (0.5 mg total) by mouth daily., Disp: 30 tablet, Rfl: 2 .  aspirin 81  MG tablet, Take 81 mg by mouth as needed. , Disp: , Rfl:  .  Cholecalciferol (VITAMIN D) 2000 UNITS tablet, Take 2,000 Units by mouth daily., Disp: , Rfl:  .  escitalopram (LEXAPRO) 10 MG tablet, Take 1 tablet (10 mg total) by mouth daily., Disp: 30 tablet, Rfl: 2 .  estrogens, conjugated, (PREMARIN) 0.625 MG tablet, Take 1 tablet (0.625 mg total) by mouth daily., Disp: 30 tablet, Rfl: 12 .  hyoscyamine (LEVSIN) 0.125 MG tablet, Take 0.125 mg by mouth as needed., Disp: , Rfl:  .  linaclotide (LINZESS) 72 MCG capsule, Take 1 capsule (72 mcg total) by mouth daily., Disp: 30 capsule, Rfl: 2 .  metoprolol tartrate (LOPRESSOR) 25 MG tablet, Take 1 tablet (25 mg total) by mouth 2 (two) times daily., Disp: 60 tablet, Rfl: 6  No Known Allergies   ROS  Constitutional: Negative for fever or weight change.  Respiratory: Negative for cough and shortness of breath.   Cardiovascular: Negative for chest pain or palpitations - except during panic attack.  Gastrointestinal: Negative for abdominal pain, no bowel changes.  Musculoskeletal: Negative for gait problem or joint swelling.  Skin: Negative for rash.  Neurological: Negative for dizziness , positive for intermittent headaches.  No other specific complaints in a complete review of systems (except as listed in HPI above).   Objective  Vitals:   05/23/16 1444  BP: 114/62  Pulse: 75  Temp: 98.1 F (36.7 C)  SpO2: 97%  Weight: 133 lb 3.2 oz (60.4 kg)    Body mass index is 22.86 kg/m.  Physical Exam  Constitutional: Patient appears well-developed and well-nourished. Obese  No distress.  HEENT: head atraumatic, normocephalic, pupils equal and reactive to light,  neck supple, throat within normal limits Cardiovascular: Normal rate, regular rhythm and normal heart sounds.  No murmur heard. No BLE edema. Pulmonary/Chest: Effort normal and breath sounds normal. No respiratory distress. Abdominal: Soft.  There is no tenderness. Psychiatric:  Patient has a normal mood and affect. behavior is normal. Judgment and thought content normal.  PHQ2/9: Depression screen [facility] 2/9 05/23/2016 02/18/2016 11/05/2015 08/10/2015 06/04/2015  Decreased Interest 0 0 0 0 0  Down, Depressed, Hopeless 0 0 0 0 0  PHQ - 2 Score 0 0 0 0 0    Fall Risk: Fall Risk  05/23/2016 02/18/2016 11/05/2015 08/10/2015 06/04/2015  Falls in the past year? No No No No No    Functional Status Survey: Is the patient deaf or have difficulty hearing?: No Does the patient have difficulty seeing, even when wearing glasses/contacts?: Yes (glasses) Does the patient have difficulty concentrating, remembering, or making decisions?: No Does the patient have difficulty walking or climbing stairs?: No Does the patient have difficulty dressing or bathing?: No Does the patient have difficulty doing errands alone such as visiting a doctor's office or shopping?: No  Assessment & Plan  1. Anxiety, generalized  - ALPRAZolam (XANAX XR) 0.5 MG 24 hr tablet; Take 1 tablet (0.5 mg total) by mouth daily.  Dispense: 30 tablet; Refill: 2 - escitalopram (LEXAPRO) 10 MG tablet; Take 1 tablet (10 mg total) by mouth daily.  Dispense: 30 tablet; Refill: 2  2. Irritable bowel syndrome with constipation  - linaclotide (LINZESS) 72 MCG capsule; Take 4 capsules (290 mcg total) by mouth daily.  Dispense: 30 capsule; Refill: 2  3. Post-menopause on HRT (hormone replacement therapy)  Resume HRT as requested  4. Chronic tension-type headache, intractable  Stop Elavil since she has not been taking on a regular basis   5. Gastro-esophageal reflux disease without esophagitis  Doing better, taking prn medication   6. Panic attack  - escitalopram (LEXAPRO) 10 MG tablet; Take 1 tablet (10 mg total) by mouth daily.  Dispense: 30 tablet; Refill: 2  7. Needs flu shot  - Flu Vaccine QUAD 36+ mos IM

## 2016-08-23 ENCOUNTER — Ambulatory Visit (INDEPENDENT_AMBULATORY_CARE_PROVIDER_SITE_OTHER): Payer: 59 | Admitting: Family Medicine

## 2016-08-23 ENCOUNTER — Encounter: Payer: Self-pay | Admitting: Family Medicine

## 2016-08-23 VITALS — BP 118/78 | HR 60 | Temp 98.0°F | Resp 16 | Ht 64.0 in | Wt 132.4 lb

## 2016-08-23 DIAGNOSIS — I471 Supraventricular tachycardia: Secondary | ICD-10-CM | POA: Diagnosis not present

## 2016-08-23 DIAGNOSIS — G44221 Chronic tension-type headache, intractable: Secondary | ICD-10-CM | POA: Diagnosis not present

## 2016-08-23 DIAGNOSIS — K219 Gastro-esophageal reflux disease without esophagitis: Secondary | ICD-10-CM | POA: Diagnosis not present

## 2016-08-23 DIAGNOSIS — F410 Panic disorder [episodic paroxysmal anxiety] without agoraphobia: Secondary | ICD-10-CM

## 2016-08-23 DIAGNOSIS — R7301 Impaired fasting glucose: Secondary | ICD-10-CM

## 2016-08-23 DIAGNOSIS — F411 Generalized anxiety disorder: Secondary | ICD-10-CM

## 2016-08-23 DIAGNOSIS — K581 Irritable bowel syndrome with constipation: Secondary | ICD-10-CM

## 2016-08-23 MED ORDER — ESCITALOPRAM OXALATE 10 MG PO TABS: 10.0000 mg | ORAL_TABLET | Freq: Every day | ORAL | 2 refills | Status: DC

## 2016-08-23 MED ORDER — LINACLOTIDE 72 MCG PO CAPS: 72.0000 ug | ORAL_CAPSULE | Freq: Every day | ORAL | 2 refills | Status: DC

## 2016-08-23 MED ORDER — ALPRAZOLAM ER 0.5 MG PO TB24: 0.5000 mg | ORAL_TABLET | Freq: Every day | ORAL | 2 refills | Status: DC

## 2016-08-23 MED ORDER — HYOSCYAMINE SULFATE 0.125 MG PO TABS: 0.1250 mg | ORAL_TABLET | Freq: Four times a day (QID) | ORAL | 0 refills | Status: DC | PRN

## 2016-08-23 MED ORDER — BUTALBITAL-APAP-CAFF-COD 50-325-40-30 MG PO CAPS: 1.0000 | ORAL_CAPSULE | ORAL | 0 refills | Status: DC | PRN

## 2016-08-23 NOTE — Progress Notes (Signed)
Name: Chelsea Decker   MRN: [id number]    DOB: 04/02/1961   Date:08/23/2016       Progress Note  Subjective  Chief Complaint  Chief Complaint  Patient presents with  . Anxiety  . Gastroesophageal Reflux  . Headache    pt been having headaches recently but she thinks its due to her glasses    HPI  Tension Headaches: she has episodes of headaches pain is described as a throbbing or sharp sensation, and radiates to right side of neck, sometimes on left side. No photophobia or phonophobia. Lasts about one hour. Down to one to two times weekly and she takes prn medication. She is no longer on Elavil and is doing well. Taking Fioricet prn  IBS with constipation: symptoms have improved with Linzess, she states no longer has diarrhea with lower dose of Linzess.  Pain is still intermittent, but no longer has constipation  GERD: under control with medication, no heartburn or regurgitation at this time, denies side effects of medication. Discussed risk of c.difi colitis and Alzheimer's, she has stopped the medication and is doing well at this time, occasionally taking Zantac or Tums otc  SVT: seen by Dr. Kirke Corin, stress test showed HR went up to 220 with maximum activity, advised to avoid caffeine. Doing better with metoprolol, no palpitation or SOB, very seldom has chest discomfort and palpitation   GAD: Sheina had to take some time off work back in September 2016 because of severe anxiety associated with her work. 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 She still feels overwhelmed at work, but coping better with medication. Alprazolam XR and Lexapro daily. 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 She still feels like she has more work than her co-workers  Hot flashes: she is taking Premarin, discussed again [facility] studies. She has been out of Premarin and hot flashes have not returned, advised her to stop it and take otc [facility]  [employer]  Patient Active Problem List   Diagnosis Date Noted  . Post-menopause on HRT (hormone replacement therapy) 11/05/2015  . Fasting hyperglycemia 08/10/2015  . Anxiety, generalized 03/20/2015  . Bradycardia 03/20/2015  . Chronic tension-type headache, intractable 03/20/2015  . Gastro-esophageal reflux disease without esophagitis 03/20/2015  . IBS (irritable bowel syndrome) 03/20/2015  . Menopause 03/20/2015  . Vitamin D deficiency 03/20/2015  . Vasovagal syncope 08/18/2014  . PSVT (paroxysmal supraventricular tachycardia) (HCC) 04/12/2012  . Lump or mass in breast 04/01/2009  . External hemorrhoids 07/15/2007    Past Surgical History:  Procedure Laterality Date  . ABDOMINAL HYSTERECTOMY     2013  . BREAST BIOPSY Left 2013   core w/clip - neg  . LAPAROSCOPIC TOTAL HYSTERECTOMY      Family History  Problem Relation Age of Onset  . Cancer Mother   . Diabetes Mother   . Cancer Father   . Diabetes Father     Social History   Social History  . Marital status: Divorced    Spouse name: N/A  . Number of children: N/A  . Years of education: N/A   Occupational History  . Not on file.   Social History Main Topics  . Smoking status: Never Smoker  . Smokeless tobacco: Never Used  . Alcohol use No  . Drug use: No  . Sexual activity: No   Other Topics Concern  . Not on file   Social History Narrative  . No narrative on file  Current Outpatient Prescriptions:  .  acetaminophen (TYLENOL) 500 MG chewable tablet, Chew 500 mg by mouth every 6 (six) hours as needed., Disp: , Rfl:  .  ALPRAZolam (XANAX XR) 0.5 MG 24 hr tablet, Take 1 tablet (0.5 mg total) by mouth daily., Disp: 30 tablet, Rfl: 2 .  aspirin 81 MG tablet, Take 81 mg by mouth as needed. , Disp: , Rfl:  .  Cholecalciferol (VITAMIN D) 2000 UNITS tablet, Take 2,000 Units by mouth daily., Disp: , Rfl:  .  escitalopram (LEXAPRO) 10 MG tablet, Take 1 tablet (10 mg total) by mouth daily., Disp: 30 tablet,  Rfl: 2 .  estrogens, conjugated, (PREMARIN) 0.625 MG tablet, Take 1 tablet (0.625 mg total) by mouth daily., Disp: 30 tablet, Rfl: 12 .  hyoscyamine (LEVSIN) 0.125 MG tablet, Take 0.125 mg by mouth as needed., Disp: , Rfl:  .  linaclotide (LINZESS) 72 MCG capsule, Take 1 capsule (72 mcg total) by mouth daily., Disp: 30 capsule, Rfl: 2 .  metoprolol tartrate (LOPRESSOR) 25 MG tablet, Take 1 tablet (25 mg total) by mouth 2 (two) times daily., Disp: 60 tablet, Rfl: 6  No Known Allergies   ROS  Constitutional: Negative for fever or weight change.  Respiratory: Negative for cough and shortness of breath.   Cardiovascular: Negative for chest pain or palpitations.  Gastrointestinal: Positive for intermittent  abdominal pain, no bowel changes - doing well on Linzess.  Musculoskeletal: Negative for gait problem or joint swelling.  Skin: Negative for rash.  Neurological: Negative for dizziness, positive for intermittent headache.  No other specific complaints in a complete review of systems (except as listed in HPI above).  Objective  Vitals:   08/23/16 1358  BP: 118/78  Pulse: 60  Resp: 16  Temp: 98 F (36.7 C)  SpO2: 93%  Weight: 132 lb 6 oz (60 kg)  Height: 5\' 4"  (1.626 m)    Body mass index is 22.72 kg/m.  Physical Exam  Constitutional: Patient appears well-developed and well-nourished.  No distress.  HEENT: head atraumatic, normocephalic, pupils equal and reactive to light,  neck supple, throat within normal limits Cardiovascular: Normal rate, regular rhythm and normal heart sounds.  No murmur heard. No BLE edema. Pulmonary/Chest: Effort normal and breath sounds normal. No respiratory distress. Abdominal: Soft.  There is no tenderness. Psychiatric: Patient has a normal mood and affect. behavior is normal. Judgment and thought content normal.  PHQ2/9: Depression screen [facility] 2/9 05/23/2016 02/18/2016 11/05/2015 08/10/2015 06/04/2015  Decreased Interest 0 0 0 0 0  Down,  Depressed, Hopeless 0 0 0 0 0  PHQ - 2 Score 0 0 0 0 0     Fall Risk: Fall Risk  05/23/2016 02/18/2016 11/05/2015 08/10/2015 06/04/2015  Falls in the past year? No No No No No    Assessment & Plan  1. Anxiety, generalized  - ALPRAZolam (XANAX XR) 0.5 MG 24 hr tablet; Take 1 tablet (0.5 mg total) by mouth daily.  Dispense: 30 tablet; Refill: 2 - escitalopram (LEXAPRO) 10 MG tablet; Take 1 tablet (10 mg total) by mouth daily.  Dispense: 30 tablet; Refill: 2  2. Irritable bowel syndrome with constipation  - linaclotide (LINZESS) 72 MCG capsule; Take 1 capsule (72 mcg total) by mouth daily.  Dispense: 30 capsule; Refill: 2 - hyoscyamine (LEVSIN) 0.125 MG tablet; Take 1 tablet (0.125 mg total) by mouth every 6 (six) hours as needed.  Dispense: 30 tablet; Refill: 0  3. Chronic tension-type headache, intractable  - butalbital-acetaminophen-caffeine (FIORICET WITH CODEINE)  50-325-40-30 MG capsule; Take 1 capsule by mouth every 4 (four) hours as needed for headache.  Dispense: 30 capsule; Refill: 0   4. Gastro-esophageal reflux disease without esophagitis  Stable  5. Fasting hyperglycemia  Due for labs on her next visit  6. PSVT (paroxysmal supraventricular tachycardia) (HCC)  Continue follow up with Dr. Kirke CorinArida, and Metoprolol daily as prescribed  7. Panic attack  - escitalopram (LEXAPRO) 10 MG tablet; Take 1 tablet (10 mg total) by mouth daily.  Dispense: 30 tablet; Refill: 2

## 2016-09-04 ENCOUNTER — Ambulatory Visit (INDEPENDENT_AMBULATORY_CARE_PROVIDER_SITE_OTHER): Payer: 59 | Admitting: Family Medicine

## 2016-09-04 ENCOUNTER — Encounter: Payer: Self-pay | Admitting: Family Medicine

## 2016-09-04 DIAGNOSIS — J0110 Acute frontal sinusitis, unspecified: Secondary | ICD-10-CM | POA: Diagnosis not present

## 2016-09-04 MED ORDER — AMOXICILLIN-POT CLAVULANATE 875-125 MG PO TABS: 1.0000 | ORAL_TABLET | Freq: Two times a day (BID) | ORAL | 0 refills | Status: DC

## 2016-09-04 NOTE — Progress Notes (Signed)
Name: Chelsea Decker   MRN: [id number]    DOB: Dec 03, 1960   Date:09/04/2016       Progress Note  Subjective  Chief Complaint  Chief Complaint  Patient presents with  . Acute Visit    Possible flu / fever    HPI  Pt. Is seen for symptoms of headache, sore throat, runny nose and fatigue. Symptoms started last week, was sent home from work last Monday, stayed out for the entire week.  She still feels somewhat sick and describes symptoms of feeling 'hot', runny nose and watery eyes.  She has tried OTC cough and Vitamin C drops and Nyquil suppressant but still not feeling back to normal.   Past Medical History:  Diagnosis Date  . Anxiety   . External hemorrhoids without mention of complication   . GERD (gastroesophageal reflux disease)   . Heart palpitations   . IBS (irritable bowel syndrome)   . Lump or mass in breast   . Palpitations   . PSVT (paroxysmal supraventricular tachycardia) (HCC) 04/12/2012  . Unspecified vitamin D deficiency     Past Surgical History:  Procedure Laterality Date  . ABDOMINAL HYSTERECTOMY     2013  . BREAST BIOPSY Left 2013   core w/clip - neg  . LAPAROSCOPIC TOTAL HYSTERECTOMY      Family History  Problem Relation Age of Onset  . Cancer Mother   . Diabetes Mother   . Cancer Father   . Diabetes Father     Social History   Social History  . Marital status: Divorced    Spouse name: N/A  . Number of children: N/A  . Years of education: N/A   Occupational History  . Not on file.   Social History Main Topics  . Smoking status: Never Smoker  . Smokeless tobacco: Never Used  . Alcohol use No  . Drug use: No  . Sexual activity: No   Other Topics Concern  . Not on file   Social History Narrative  . No narrative on file     Current Outpatient Prescriptions:  .  acetaminophen (TYLENOL) 500 MG chewable tablet, Chew 500 mg by mouth every 6 (six) hours as needed., Disp: , Rfl:  .  ALPRAZolam (XANAX XR) 0.5 MG 24 hr tablet, Take  1 tablet (0.5 mg total) by mouth daily., Disp: 30 tablet, Rfl: 2 .  aspirin 81 MG tablet, Take 81 mg by mouth as needed. , Disp: , Rfl:  .  butalbital-acetaminophen-caffeine (FIORICET WITH CODEINE) 50-325-40-30 MG capsule, Take 1 capsule by mouth every 4 (four) hours as needed for headache., Disp: 30 capsule, Rfl: 0 .  Cholecalciferol (VITAMIN D) 2000 UNITS tablet, Take 2,000 Units by mouth daily., Disp: , Rfl:  .  escitalopram (LEXAPRO) 10 MG tablet, Take 1 tablet (10 mg total) by mouth daily., Disp: 30 tablet, Rfl: 2 .  hyoscyamine (LEVSIN) 0.125 MG tablet, Take 1 tablet (0.125 mg total) by mouth every 6 (six) hours as needed., Disp: 30 tablet, Rfl: 0 .  linaclotide (LINZESS) 72 MCG capsule, Take 1 capsule (72 mcg total) by mouth daily., Disp: 30 capsule, Rfl: 2 .  metoprolol tartrate (LOPRESSOR) 25 MG tablet, Take 1 tablet (25 mg total) by mouth 2 (two) times daily., Disp: 60 tablet, Rfl: 6  No Known Allergies   ROS  Please see HPI for complete discussion of ROS  Objective  Vitals:   09/04/16 1431  BP: 118/67  Pulse: (!) 109  Resp: 17  Temp: 98.8 F (  37.1 C)  TempSrc: Oral  SpO2: 97%  Weight: 131 lb 1.6 oz (59.5 kg)  Height: 5\' 5"  (1.651 m)    Physical Exam  Constitutional: She is well-developed, well-nourished, and in no distress.  HENT:  Head: Normocephalic and atraumatic.  Nose: Right sinus exhibits frontal sinus tenderness. Right sinus exhibits no maxillary sinus tenderness. Left sinus exhibits frontal sinus tenderness. Left sinus exhibits no maxillary sinus tenderness.  Mouth/Throat: Posterior oropharyngeal erythema present. No oropharyngeal exudate or posterior oropharyngeal edema.  Cardiovascular: Normal rate, regular rhythm and normal heart sounds.   No murmur heard. Pulmonary/Chest: Breath sounds normal. No respiratory distress. She has no wheezes. She has no rales.  Nursing note and vitals reviewed.      Assessment & Plan  1. Acute non-recurrent frontal  sinusitis  - amoxicillin-clavulanate (AUGMENTIN) 875-125 MG tablet; Take 1 tablet by mouth 2 (two) times daily.  Dispense: 20 tablet; Refill: 0   Shayna Eblen [facility] 09/04/2016 3:03 PM

## 2016-11-24 ENCOUNTER — Encounter: Payer: Self-pay | Admitting: Family Medicine

## 2016-11-24 ENCOUNTER — Ambulatory Visit (INDEPENDENT_AMBULATORY_CARE_PROVIDER_SITE_OTHER): Payer: 59 | Admitting: Family Medicine

## 2016-11-24 VITALS — BP 112/62 | HR 65 | Temp 98.1°F | Resp 16 | Ht 65.0 in | Wt 132.4 lb

## 2016-11-24 DIAGNOSIS — Z79899 Other long term (current) drug therapy: Secondary | ICD-10-CM

## 2016-11-24 DIAGNOSIS — R7301 Impaired fasting glucose: Secondary | ICD-10-CM | POA: Diagnosis not present

## 2016-11-24 DIAGNOSIS — F411 Generalized anxiety disorder: Secondary | ICD-10-CM

## 2016-11-24 DIAGNOSIS — F410 Panic disorder [episodic paroxysmal anxiety] without agoraphobia: Secondary | ICD-10-CM | POA: Diagnosis not present

## 2016-11-24 DIAGNOSIS — K581 Irritable bowel syndrome with constipation: Secondary | ICD-10-CM | POA: Diagnosis not present

## 2016-11-24 DIAGNOSIS — I471 Supraventricular tachycardia, unspecified: Secondary | ICD-10-CM

## 2016-11-24 DIAGNOSIS — G44221 Chronic tension-type headache, intractable: Secondary | ICD-10-CM

## 2016-11-24 DIAGNOSIS — Z13220 Encounter for screening for lipoid disorders: Secondary | ICD-10-CM | POA: Diagnosis not present

## 2016-11-24 DIAGNOSIS — K219 Gastro-esophageal reflux disease without esophagitis: Secondary | ICD-10-CM | POA: Diagnosis not present

## 2016-11-24 MED ORDER — LINACLOTIDE 72 MCG PO CAPS: 72.0000 ug | ORAL_CAPSULE | Freq: Every day | ORAL | 2 refills | Status: DC

## 2016-11-24 MED ORDER — ALPRAZOLAM ER 0.5 MG PO TB24: 0.5000 mg | ORAL_TABLET | Freq: Every day | ORAL | 2 refills | Status: DC

## 2016-11-24 MED ORDER — HYOSCYAMINE SULFATE 0.125 MG PO TABS: 0.1250 mg | ORAL_TABLET | Freq: Four times a day (QID) | ORAL | 0 refills | Status: DC | PRN

## 2016-11-24 MED ORDER — ESCITALOPRAM OXALATE 10 MG PO TABS: 10.0000 mg | ORAL_TABLET | Freq: Every day | ORAL | 2 refills | Status: DC

## 2016-11-24 NOTE — Progress Notes (Signed)
Name: Chelsea Decker   MRN: [id number]    DOB: [birth year]   Date:11/24/2016       Progress Note  Subjective  Chief Complaint  Chief Complaint  Patient presents with  . Medication Refill    3 month F/U  . Anxiety  . Irritable Bowel Syndrome  . Gastroesophageal Reflux  . Hot Flashes  . Tension headaches    HPI  Tension Headaches: she has episodes of headaches pain is described as a throbbing or sharp sensation, and radiates to right side of neck, sometimes on left side. No photophobia or phonophobia. Lasts about one hour. Down to one to two times weekly and she takes prn medication. She is no longer on Elavil and is doing well. Taking Fioricet prn, last rx written 08/2016 and she still has pills left  IBS with constipation: symptoms have improved with Linzess, she still has episodes of diarrhea with lower dose of Linzess, but not as severe. Pain is still intermittent, but no longer has constipation. Taking Levsin prn.   GERD: under control with medication, no heartburn or regurgitation at this time, denies side effects of medication. Discussed risk of c.difi colitis and Alzheimer's, she has stopped the medication and is doing well at this time, occasionally taking Zantac or Tums otc   SVT: seen by Dr. Kirke Corin, stress test showed HR went up to 220 with maximum activity, advised to avoid caffeine. Doing better with metoprolol, occasionally has  palpitation and chest pain but no SOB.  GAD: Haille had to take some time off work back in September 2016 because of severe anxiety associated with her work. 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 She still feels overwhelmed at work, but coping better with medication. Alprazolam XR and Lexapro daily. 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 She still feels like she has more work than her co-workers. However she is getting her job done and trying to avoid complaining about  it.   Patient Active Problem List   Diagnosis Date Noted  . Acute non-recurrent frontal sinusitis 09/04/2016  . Post-menopause on HRT (hormone replacement therapy) 11/05/2015  . Fasting hyperglycemia 08/10/2015  . Anxiety, generalized 03/20/2015  . Bradycardia 03/20/2015  . Chronic tension-type headache, intractable 03/20/2015  . Gastro-esophageal reflux disease without esophagitis 03/20/2015  . IBS (irritable bowel syndrome) 03/20/2015  . Menopause 03/20/2015  . Vitamin D deficiency 03/20/2015  . Vasovagal syncope 08/18/2014  . PSVT (paroxysmal supraventricular tachycardia) (HCC) 04/12/2012  . Lump or mass in breast 04/01/2009  . External hemorrhoids 07/15/2007    Past Surgical History:  Procedure Laterality Date  . ABDOMINAL HYSTERECTOMY     2013  . BREAST BIOPSY Left 2013   core w/clip - neg  . LAPAROSCOPIC TOTAL HYSTERECTOMY      Family History  Problem Relation Age of Onset  . Cancer Mother   . Diabetes Mother   . Cancer Father   . Diabetes Father     Social History   Social History  . Marital status: Divorced    Spouse name: N/A  . Number of children: N/A  . Years of education: N/A   Occupational History  . Not on file.   Social History Main Topics  . Smoking status: Never Smoker  . Smokeless tobacco: Never Used  . Alcohol use No  . Drug use: No  . Sexual activity: No   Other Topics Concern  . Not on file  Social History Narrative  . No narrative on file     Current Outpatient Prescriptions:  .  acetaminophen (TYLENOL) 500 MG chewable tablet, Chew 500 mg by mouth every 6 (six) hours as needed., Disp: , Rfl:  .  ALPRAZolam (XANAX XR) 0.5 MG 24 hr tablet, Take 1 tablet (0.5 mg total) by mouth daily., Disp: 30 tablet, Rfl: 2 .  aspirin 81 MG tablet, Take 81 mg by mouth as needed. , Disp: , Rfl:  .  butalbital-acetaminophen-caffeine (FIORICET WITH CODEINE) 50-325-40-30 MG capsule, Take 1 capsule by mouth every 4 (four) hours as needed for  headache., Disp: 30 capsule, Rfl: 0 .  Cholecalciferol (VITAMIN D) 2000 UNITS tablet, Take 2,000 Units by mouth daily., Disp: , Rfl:  .  escitalopram (LEXAPRO) 10 MG tablet, Take 1 tablet (10 mg total) by mouth daily., Disp: 30 tablet, Rfl: 2 .  hyoscyamine (LEVSIN) 0.125 MG tablet, Take 1 tablet (0.125 mg total) by mouth every 6 (six) hours as needed., Disp: 30 tablet, Rfl: 0 .  linaclotide (LINZESS) 72 MCG capsule, Take 1 capsule (72 mcg total) by mouth daily., Disp: 30 capsule, Rfl: 2 .  metoprolol tartrate (LOPRESSOR) 25 MG tablet, Take 1 tablet (25 mg total) by mouth 2 (two) times daily., Disp: 60 tablet, Rfl: 6  No Known Allergies   ROS  Constitutional: Negative for fever or weight change.  Respiratory: Negative for cough and shortness of breath.   Cardiovascular: Positive for chest pain and  Palpitations intermittent.  Gastrointestinal: Negative for abdominal pain, no bowel changes.  Musculoskeletal: Negative for gait problem or joint swelling.  Skin: Negative for rash.  Neurological: Negative for dizziness, positive for intermittent  headaches.  No other specific complaints in a complete review of systems (except as listed in HPI above).  Objective  Vitals:   11/24/16 1349  BP: 112/62  Pulse: 65  Resp: 16  Temp: 98.1 F (36.7 C)  TempSrc: Oral  SpO2: 98%  Weight: 132 lb 6.4 oz (60.1 kg)  Height:  (1.651 m)    Body mass index is 22.03 kg/m.  Physical Exam  Constitutional: Patient appears well-developed and well-nourished.  No distress.  HEENT: head atraumatic, normocephalic, pupils equal and reactive to light, neck supple, throat within normal limits Cardiovascular: Normal rate, regular rhythm and normal heart sounds.  No murmur heard. No BLE edema. Pulmonary/Chest: Effort normal and breath sounds normal. No respiratory distress. Abdominal: Soft.  There is no tenderness. Psychiatric: Patient has a normal mood and affect. behavior is normal. Judgment and  thought content normal.  PHQ2/9: Depression screen [facility] 2/9 11/24/2016 09/04/2016 05/23/2016 02/18/2016 11/05/2015  Decreased Interest 0 0 0 0 0  Down, Depressed, Hopeless 0 0 0 0 0  PHQ - 2 Score 0 0 0 0 0    Fall Risk: Fall Risk  11/24/2016 09/04/2016 05/23/2016 02/18/2016 11/05/2015  Falls in the past year? No No No No No     Functional Status Survey: Is the patient deaf or have difficulty hearing?: No Does the patient have difficulty seeing, even when wearing glasses/contacts?: No Does the patient have difficulty concentrating, remembering, or making decisions?: No Does the patient have difficulty walking or climbing stairs?: No Does the patient have difficulty dressing or bathing?: No Does the patient have difficulty doing errands alone such as visiting a doctor's office or shopping?: No   Assessment & Plan  1. Anxiety, generalized  - ALPRAZolam (XANAX XR) 0.5 MG 24 hr tablet; Take 1 tablet (0.5 mg total) by  mouth daily.  Dispense: 30 tablet; Refill: 2 - escitalopram (LEXAPRO) 10 MG tablet; Take 1 tablet (10 mg total) by mouth daily.  Dispense: 30 tablet; Refill: 2  2. Chronic tension-type headache, intractable  Doing better  3. Irritable bowel syndrome with constipation  - hyoscyamine (LEVSIN) 0.125 MG tablet; Take 1 tablet (0.125 mg total) by mouth every 6 (six) hours as needed.  Dispense: 30 tablet; Refill: 0 - linaclotide (LINZESS) 72 MCG capsule; Take 1 capsule (72 mcg total) by mouth daily.  Dispense: 30 capsule; Refill: 2  4. Gastro-esophageal reflux disease without esophagitis  Doing well at this time  5. Fasting hyperglycemia  - Hemoglobin A1c - Insulin, fasting  6. Lipid screening  - Lipid panel  7. Long-term use of high-risk medication  - COMPLETE METABOLIC PANEL WITH GFR  8. PSVT (paroxysmal supraventricular tachycardia) (HCC)  She sees Dr. Kirke Corin yearly  9. Panic attack  - escitalopram (LEXAPRO) 10 MG tablet; Take 1 tablet (10 mg total) by mouth  daily.  Dispense: 30 tablet; Refill: 2

## 2016-11-27 DIAGNOSIS — R7301 Impaired fasting glucose: Secondary | ICD-10-CM | POA: Diagnosis not present

## 2016-11-27 DIAGNOSIS — Z13220 Encounter for screening for lipoid disorders: Secondary | ICD-10-CM | POA: Diagnosis not present

## 2016-11-27 DIAGNOSIS — Z79899 Other long term (current) drug therapy: Secondary | ICD-10-CM | POA: Diagnosis not present

## 2016-11-28 ENCOUNTER — Encounter: Payer: Self-pay | Admitting: Family Medicine

## 2016-11-28 LAB — LIPID PANEL
CHOLESTEROL: 189 mg/dL (ref <200)
HDL: 65 mg/dL (ref >50)
LDL Cholesterol: 114 mg/dL — ABNORMAL HIGH (ref <100)
Total CHOL/HDL Ratio: 2.9 Ratio (ref <5.0)
Triglycerides: 49 mg/dL (ref <150)
VLDL: 10 mg/dL (ref <30)

## 2016-11-28 LAB — COMPLETE METABOLIC PANEL WITH GFR
ALBUMIN: 4 g/dL (ref 3.6–5.1)
ALK PHOS: 57 U/L (ref 33–130)
ALT: 10 U/L (ref 6–29)
AST: 15 U/L (ref 10–35)
BILIRUBIN TOTAL: 0.4 mg/dL (ref 0.2–1.2)
BUN: 15 mg/dL (ref 7–25)
CALCIUM: 9.3 mg/dL (ref 8.6–10.4)
CO2: 27 mmol/L (ref 20–31)
CREATININE: 0.58 mg/dL (ref 0.50–1.05)
Chloride: 108 mmol/L (ref 98–110)
GFR, Est African American: >89 mL/min (ref >=60)
GFR, Est Non African American: >89 mL/min (ref >=60)
Glucose, Bld: 86 mg/dL (ref 65–99)
POTASSIUM: 3.6 mmol/L (ref 3.5–5.3)
Sodium: 142 mmol/L (ref 135–146)
Total Protein: 6.8 g/dL (ref 6.1–8.1)

## 2016-11-28 LAB — INSULIN, FASTING: INSULIN FASTING, SERUM: 4.7 u[IU]/mL (ref 2.0–19.6)

## 2016-11-28 LAB — HEMOGLOBIN A1C
Hgb A1c MFr Bld: 5.6 % (ref <5.7)
MEAN PLASMA GLUCOSE: 114 mg/dL

## 2017-01-02 ENCOUNTER — Telehealth: Payer: Self-pay | Admitting: Cardiovascular Disease

## 2017-01-02 ENCOUNTER — Other Ambulatory Visit: Payer: Self-pay

## 2017-01-02 DIAGNOSIS — I471 Supraventricular tachycardia: Secondary | ICD-10-CM

## 2017-01-02 MED ORDER — METOPROLOL TARTRATE 25 MG PO TABS: 25.0000 mg | ORAL_TABLET | Freq: Two times a day (BID) | ORAL | 1 refills | Status: DC

## 2017-01-02 NOTE — Telephone Encounter (Signed)
Left detailed message on pt's cell VM.  She has been instructed to continue metoprolol 25mg  BID and f/u w/Dr. Kirke CorinArida July 20. [facility]

## 2017-01-02 NOTE — Telephone Encounter (Signed)
Pt is out of her metoprolol, but needs to know if she should still keep taking this. If so, she needs a refill sent to her pharmacy.

## 2017-02-20 ENCOUNTER — Telehealth: Payer: Self-pay | Admitting: Cardiovascular Disease

## 2017-02-20 ENCOUNTER — Encounter: Payer: Self-pay | Admitting: Emergency Medicine

## 2017-02-20 ENCOUNTER — Emergency Department: Payer: 59

## 2017-02-20 ENCOUNTER — Observation Stay
Admission: EM | Admit: 2017-02-20 | Discharge: 2017-02-21 | Disposition: A | Discharge status: Home / Self Care | Payer: 59 | Attending: Internal Medicine | Admitting: Internal Medicine

## 2017-02-20 ENCOUNTER — Observation Stay: Payer: 59

## 2017-02-20 DIAGNOSIS — R5383 Other fatigue: Secondary | ICD-10-CM | POA: Diagnosis not present

## 2017-02-20 DIAGNOSIS — R002 Palpitations: Secondary | ICD-10-CM | POA: Insufficient documentation

## 2017-02-20 DIAGNOSIS — K581 Irritable bowel syndrome with constipation: Secondary | ICD-10-CM | POA: Diagnosis not present

## 2017-02-20 DIAGNOSIS — I471 Supraventricular tachycardia: Secondary | ICD-10-CM | POA: Insufficient documentation

## 2017-02-20 DIAGNOSIS — J45909 Unspecified asthma, uncomplicated: Secondary | ICD-10-CM | POA: Diagnosis not present

## 2017-02-20 DIAGNOSIS — K644 Residual hemorrhoidal skin tags: Secondary | ICD-10-CM | POA: Insufficient documentation

## 2017-02-20 DIAGNOSIS — R0789 Other chest pain: Secondary | ICD-10-CM | POA: Diagnosis not present

## 2017-02-20 DIAGNOSIS — R079 Chest pain, unspecified: Secondary | ICD-10-CM | POA: Diagnosis not present

## 2017-02-20 DIAGNOSIS — F411 Generalized anxiety disorder: Secondary | ICD-10-CM | POA: Diagnosis not present

## 2017-02-20 DIAGNOSIS — Z8679 Personal history of other diseases of the circulatory system: Secondary | ICD-10-CM | POA: Diagnosis not present

## 2017-02-20 DIAGNOSIS — Z90710 Acquired absence of both cervix and uterus: Secondary | ICD-10-CM | POA: Diagnosis not present

## 2017-02-20 DIAGNOSIS — N63 Unspecified lump in unspecified breast: Secondary | ICD-10-CM | POA: Insufficient documentation

## 2017-02-20 DIAGNOSIS — Z809 Family history of malignant neoplasm, unspecified: Secondary | ICD-10-CM | POA: Insufficient documentation

## 2017-02-20 DIAGNOSIS — R0602 Shortness of breath: Secondary | ICD-10-CM

## 2017-02-20 DIAGNOSIS — E559 Vitamin D deficiency, unspecified: Secondary | ICD-10-CM | POA: Diagnosis not present

## 2017-02-20 DIAGNOSIS — Z79899 Other long term (current) drug therapy: Secondary | ICD-10-CM | POA: Insufficient documentation

## 2017-02-20 DIAGNOSIS — K589 Irritable bowel syndrome without diarrhea: Secondary | ICD-10-CM | POA: Diagnosis not present

## 2017-02-20 DIAGNOSIS — G44221 Chronic tension-type headache, intractable: Secondary | ICD-10-CM | POA: Diagnosis not present

## 2017-02-20 DIAGNOSIS — Z7982 Long term (current) use of aspirin: Secondary | ICD-10-CM | POA: Diagnosis not present

## 2017-02-20 DIAGNOSIS — K219 Gastro-esophageal reflux disease without esophagitis: Secondary | ICD-10-CM | POA: Diagnosis not present

## 2017-02-20 DIAGNOSIS — Z833 Family history of diabetes mellitus: Secondary | ICD-10-CM | POA: Diagnosis not present

## 2017-02-20 DIAGNOSIS — Z79890 Hormone replacement therapy (postmenopausal): Secondary | ICD-10-CM | POA: Insufficient documentation

## 2017-02-20 DIAGNOSIS — J0110 Acute frontal sinusitis, unspecified: Secondary | ICD-10-CM | POA: Diagnosis not present

## 2017-02-20 HISTORY — DX: Unspecified asthma, uncomplicated: J45.909

## 2017-02-20 LAB — BASIC METABOLIC PANEL
ANION GAP: 6 (ref 5–15)
BUN: 15 mg/dL (ref 6–20)
CALCIUM: 10 mg/dL (ref 8.9–10.3)
CO2: 29 mmol/L (ref 22–32)
Chloride: 105 mmol/L (ref 101–111)
Creatinine, Ser: 0.83 mg/dL (ref 0.44–1.00)
GFR calc Af Amer: >60 mL/min (ref >60)
GLUCOSE: 83 mg/dL (ref 65–99)
POTASSIUM: 3.9 mmol/L (ref 3.5–5.1)
Sodium: 140 mmol/L (ref 135–145)

## 2017-02-20 LAB — CBC
HEMATOCRIT: 36.8 % (ref 35.0–47.0)
HEMOGLOBIN: 12.5 g/dL (ref 12.0–16.0)
MCH: 30.1 pg (ref 26.0–34.0)
MCHC: 34 g/dL (ref 32.0–36.0)
MCV: 88.6 fL (ref 80.0–100.0)
Platelets: 151 10*3/uL (ref 150–440)
RBC: 4.15 MIL/uL (ref 3.80–5.20)
RDW: 14.9 % — AB (ref 11.5–14.5)
WBC: 8 10*3/uL (ref 3.6–11.0)

## 2017-02-20 LAB — TROPONIN I: Troponin I: <0.03 ng/mL (ref <0.03)

## 2017-02-20 MED ORDER — ONDANSETRON HCL 4 MG PO TABS: 4.0000 mg | ORAL_TABLET | Freq: Four times a day (QID) | ORAL | Status: DC | PRN

## 2017-02-20 MED ORDER — ENOXAPARIN SODIUM 40 MG/0.4ML ~~LOC~~ SOLN: 40.0000 mg | SUBCUTANEOUS | Status: DC

## 2017-02-20 MED ORDER — POLYETHYLENE GLYCOL 3350 17 G PO PACK: 17.0000 g | PACK | Freq: Every day | ORAL | Status: DC | PRN

## 2017-02-20 MED ORDER — ASPIRIN EC 81 MG PO TBEC
81.0000 mg | DELAYED_RELEASE_TABLET | Freq: Every day | ORAL | Status: DC
  Administered 2017-02-21: 81 mg via ORAL
  Filled 2017-02-20: qty 1

## 2017-02-20 MED ORDER — ALBUTEROL SULFATE (2.5 MG/3ML) 0.083% IN NEBU: 2.5000 mg | INHALATION_SOLUTION | RESPIRATORY_TRACT | Status: DC | PRN

## 2017-02-20 MED ORDER — ONDANSETRON HCL 4 MG/2ML IJ SOLN: 4.0000 mg | Freq: Four times a day (QID) | INTRAMUSCULAR | Status: DC | PRN

## 2017-02-20 MED ORDER — ACETAMINOPHEN 650 MG RE SUPP: 650.0000 mg | Freq: Four times a day (QID) | RECTAL | Status: DC | PRN

## 2017-02-20 MED ORDER — ACETAMINOPHEN 325 MG PO TABS
650.0000 mg | ORAL_TABLET | Freq: Four times a day (QID) | ORAL | Status: DC | PRN
Start: 2017-02-20 — End: 2017-02-21
  Administered 2017-02-21: 650 mg via ORAL
  Filled 2017-02-20: qty 2

## 2017-02-20 MED ORDER — ASPIRIN 81 MG PO CHEW
324.0000 mg | CHEWABLE_TABLET | Freq: Once | ORAL | Status: AC
  Administered 2017-02-20: 324 mg via ORAL
  Filled 2017-02-20: qty 4

## 2017-02-20 MED ORDER — IOPAMIDOL (ISOVUE-370) INJECTION 76%
75.0000 mL | Freq: Once | INTRAVENOUS | Status: AC | PRN
  Administered 2017-02-20: 75 mL via INTRAVENOUS

## 2017-02-20 MED ORDER — NITROGLYCERIN 0.4 MG SL SUBL
0.4000 mg | SUBLINGUAL_TABLET | SUBLINGUAL | Status: DC | PRN
  Administered 2017-02-20 (×2): 0.4 mg via SUBLINGUAL
  Filled 2017-02-20: qty 1

## 2017-02-20 MED ORDER — SODIUM CHLORIDE 0.9% FLUSH
3.0000 mL | Freq: Two times a day (BID) | INTRAVENOUS | Status: DC
  Administered 2017-02-20 – 2017-02-21 (×2): 3 mL via INTRAVENOUS

## 2017-02-20 NOTE — H&P (Signed)
[facility] [place] [facility]   PATIENT NAME: Chelsea ElliotVanessa Hillock    MR#:  [id number]  DATE OF BIRTH:  07-13-1961  DATE OF ADMISSION:  02/20/2017  PRIMARY CARE PHYSICIAN: Alba CorySowles, Krichna, MD   REQUESTING/REFERRING PHYSICIAN: Dr. Scotty CourtStafford  CHIEF COMPLAINT:   Chief Complaint  Patient presents with  . Chest Pain    HISTORY OF PRESENT ILLNESS:  Chelsea Decker  is a 56 y.o. female with a known history of SVT on metoprolol presents to the emergency room after she noticed chest pain and palpitations with exertion at work. 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 With minimal exertion she noticed right-sided chest pain along with tachycardia in the 120s on her fitted bit which improved with rest. This happened multiple times and she decided to come to the emergency room. Now she is laying in bed has no pain. Heart rate is in the 50s. Troponin normal. EKG normal. Due to exertional chest pain and palpitations hospital is being consulted for admission.  Patient had a normal stress test in 2017 for similar chest pain. Follows with cardiology- [facility] PAST MEDICAL HISTORY:   Past Medical History:  Diagnosis Date  . Anxiety   . External hemorrhoids without mention of complication   . GERD (gastroesophageal reflux disease)   . Heart palpitations   . IBS (irritable bowel syndrome)   . Lump or mass in breast   . Palpitations   . PSVT (paroxysmal supraventricular tachycardia) (HCC) 04/12/2012  . Unspecified vitamin D deficiency     PAST SURGICAL HISTORY:   Past Surgical History:  Procedure Laterality Date  . ABDOMINAL HYSTERECTOMY     2013  . BREAST BIOPSY Left 2013   core w/clip - neg  . LAPAROSCOPIC TOTAL HYSTERECTOMY      SOCIAL HISTORY:   Social History  Substance Use Topics  . Smoking status: Never Smoker  . Smokeless tobacco: Never Used  . Alcohol use No    FAMILY HISTORY:   Family History  Problem Relation Age of Onset  . Cancer  Mother   . Diabetes Mother   . Cancer Father   . Diabetes Father     DRUG ALLERGIES:  No Known Allergies  REVIEW OF SYSTEMS:   Review of Systems  Constitutional: Positive for malaise/fatigue. Negative for chills and fever.  HENT: Negative for sore throat.   Eyes: Negative for blurred vision, double vision and pain.  Respiratory: Negative for cough, hemoptysis, shortness of breath and wheezing.   Cardiovascular: Positive for chest pain and palpitations. Negative for orthopnea and leg swelling.  Gastrointestinal: Negative for abdominal pain, constipation, diarrhea, heartburn, nausea and vomiting.  Genitourinary: Negative for dysuria and hematuria.  Musculoskeletal: Negative for back pain and joint pain.  Skin: Negative for rash.  Neurological: Negative for sensory change, speech change, focal weakness and headaches.  Endo/Heme/Allergies: Does not bruise/bleed easily.  Psychiatric/Behavioral: Negative for depression. The patient is not nervous/anxious.     MEDICATIONS AT HOME:   Prior to Admission medications   Medication Sig Start Date End Date Taking? Authorizing Provider  acetaminophen (TYLENOL) 500 MG chewable tablet Chew 500 mg by mouth every 6 (six) hours as needed.   Yes [provider]  ALPRAZolam (XANAX XR) 0.5 MG 24 hr tablet Take 1 tablet (0.5 mg total) by mouth daily. 11/24/16  Yes Alba CorySowles, Krichna, MD  aspirin 81 MG tablet Take 81 mg by mouth as needed.    Yes [provider]  butalbital-acetaminophen-caffeine (FIORICET WITH  CODEINE) 50-325-40-30 MG capsule Take 1 capsule by mouth every 4 (four) hours as needed for headache. 08/23/16  Yes Sowles, Danna Hefty, MD  Cholecalciferol (VITAMIN D) 2000 UNITS tablet Take 2,000 Units by mouth daily.   Yes [provider]  escitalopram (LEXAPRO) 10 MG tablet Take 1 tablet (10 mg total) by mouth daily. 11/24/16  Yes Sowles, Danna Hefty, MD  hyoscyamine (LEVSIN) 0.125 MG tablet Take 1 tablet (0.125 mg total) by mouth  every 6 (six) hours as needed. 11/24/16  Yes Sowles, Danna Hefty, MD  linaclotide Karlene Einstein) 72 MCG capsule Take 1 capsule (72 mcg total) by mouth daily. Patient taking differently: Take 72 mcg by mouth daily as needed.  11/24/16  Yes Sowles, Danna Hefty, MD  metoprolol tartrate (LOPRESSOR) 25 MG tablet Take 1 tablet (25 mg total) by mouth 2 (two) times daily. 01/02/17  Yes Iran Ouch, MD     VITAL SIGNS:  Blood pressure 115/74, pulse (!) 48, temperature 98.4 F (36.9 C), temperature source Oral, resp. rate 11, height 5\' 4"  (1.626 m), weight 61.2 kg (135 lb), SpO2 100 %.  PHYSICAL EXAMINATION:  Physical Exam  GENERAL:  57 y.o.-year-old patient lying in the bed with no acute distress.  EYES: Pupils equal, round, reactive to light and accommodation. No scleral icterus. Extraocular muscles intact.  HEENT: Head atraumatic, normocephalic. Oropharynx and nasopharynx clear. No oropharyngeal erythema, moist oral mucosa  NECK:  Supple, no jugular venous distention. No thyroid enlargement, no tenderness.  LUNGS: Normal breath sounds bilaterally, no wheezing, rales, rhonchi. No use of accessory muscles of respiration.  CARDIOVASCULAR: S1, S2 normal. No murmurs, rubs, or gallops.  ABDOMEN: Soft, nontender, nondistended. Bowel sounds present. No organomegaly or mass.  EXTREMITIES: No pedal edema, cyanosis, or clubbing. + 2 pedal & radial pulses b/l.   NEUROLOGIC: Cranial nerves II through XII are intact. No focal Motor or sensory deficits appreciated b/l PSYCHIATRIC: The patient is alert and oriented x 3. Good affect.  SKIN: No obvious rash, lesion, or ulcer.   LABORATORY PANEL:   CBC  Recent Labs Lab 02/20/17 1451  WBC 8.0  HGB 12.5  HCT 36.8  PLT 151   ------------------------------------------------------------------------------------------------------------------  Chemistries   Recent Labs Lab 02/20/17 1451  NA 140  K 3.9  CL 105  CO2 29  GLUCOSE 83  BUN 15  CREATININE 0.83   CALCIUM 10.0   ------------------------------------------------------------------------------------------------------------------  Cardiac Enzymes  Recent Labs Lab 02/20/17 1451  TROPONINI <0.03   ------------------------------------------------------------------------------------------------------------------  RADIOLOGY:  Dg Chest 2 View  Result Date: 02/20/2017 CLINICAL DATA:  Shortness of breath. EXAM: CHEST  2 VIEW COMPARISON:  Radiographs of November 26, 2014. FINDINGS: The heart size and mediastinal contours are within normal limits. Both lungs are clear. No pneumothorax or pleural effusion is noted. The visualized skeletal structures are unremarkable. IMPRESSION: No active cardiopulmonary disease. Electronically Signed   By: Lupita Raider, M.D.   On: 02/20/2017 15:29     IMPRESSION AND PLAN:   * Chest pain, atypical with palpitations but exertional Troponin normal. EKG shows no acute changes. She did have a normal stress test in 2017 which is reassuring. Will check a CTA of the chest to rule out pulmonary embolism due to her shortness of breath and tachycardia with exertion. If this is normal will have cardiology evaluate the patient for further workup. Repeat troponin. Observation on telemetry floor Aspirin. Nitroglycerin when necessary  * Paroxysmal SVT. Continue metoprolol from home.  DVT prophylaxis with Lovenox  All the records are reviewed and case  discussed with ED provider. Management plans discussed with the patient, family and they are in agreement.  CODE STATUS: FULL CODE  TOTAL TIME TAKING CARE OF THIS PATIENT: 40 minutes.   Milagros Loll R M.D on 02/20/2017 at 6:03 PM  Between 7am to 6pm - Pager - [phone]  After 6pm go to [email or web address] - password [email or web address] [facility]  [facility]  Office  [phone]  CC: Primary care physician; Alba Cory, MD  Note: This dictation was prepared with Dragon dictation along with smaller phrase  technology. Any transcriptional errors that result from this process are unintentional.

## 2017-02-20 NOTE — Telephone Encounter (Signed)
See additional phone note. 

## 2017-02-20 NOTE — ED Provider Notes (Addendum)
[facility] Emergency Department Provider Note  ____________________________________________  Time seen: Approximately 5:04 PM  I have reviewed the triage vital signs and the nursing notes.   HISTORY  Chief Complaint Chest Pain    HPI Chelsea Decker is a 56 y.o. female who complains of sudden onset of right-sided chest pain that started at 9 AM today. Intermittent, lasting about 30 minutes at a time, associated with shortness of breath and diaphoresis. Radiating to the jaw and right shoulder. Described as sharp and pressure, severe 10 out of 10. Alleviated with rest, worse with exertion and walking today. Never had anything like this before.     Past Medical History:  Diagnosis Date  . Anxiety   . External hemorrhoids without mention of complication   . GERD (gastroesophageal reflux disease)   . Heart palpitations   . IBS (irritable bowel syndrome)   . Lump or mass in breast   . Palpitations   . PSVT (paroxysmal supraventricular tachycardia) (HCC) 04/12/2012  . Unspecified vitamin D deficiency      Patient Active Problem List   Diagnosis Date Noted  . Acute non-recurrent frontal sinusitis 09/04/2016  . Post-menopause on HRT (hormone replacement therapy) 11/05/2015  . Fasting hyperglycemia 08/10/2015  . Anxiety, generalized 03/20/2015  . Bradycardia 03/20/2015  . Chronic tension-type headache, intractable 03/20/2015  . Gastro-esophageal reflux disease without esophagitis 03/20/2015  . IBS (irritable bowel syndrome) 03/20/2015  . Menopause 03/20/2015  . Vitamin D deficiency 03/20/2015  . Vasovagal syncope 08/18/2014  . PSVT (paroxysmal supraventricular tachycardia) (HCC) 04/12/2012  . Lump or mass in breast 04/01/2009  . External hemorrhoids 07/15/2007     Past Surgical History:  Procedure Laterality Date  . ABDOMINAL HYSTERECTOMY     2013  . BREAST BIOPSY Left 2013   core w/clip - neg  . LAPAROSCOPIC TOTAL HYSTERECTOMY        Prior to Admission medications   Medication Sig Start Date End Date Taking? Authorizing Provider  acetaminophen (TYLENOL) 500 MG chewable tablet Chew 500 mg by mouth every 6 (six) hours as needed.    [provider]  ALPRAZolam (XANAX XR) 0.5 MG 24 hr tablet Take 1 tablet (0.5 mg total) by mouth daily. 11/24/16   Alba Cory, MD  aspirin 81 MG tablet Take 81 mg by mouth as needed.     [provider]  butalbital-acetaminophen-caffeine (FIORICET WITH CODEINE) 50-325-40-30 MG capsule Take 1 capsule by mouth every 4 (four) hours as needed for headache. 08/23/16   Alba Cory, MD  Cholecalciferol (VITAMIN D) 2000 UNITS tablet Take 2,000 Units by mouth daily.    [provider]  escitalopram (LEXAPRO) 10 MG tablet Take 1 tablet (10 mg total) by mouth daily. 11/24/16   Alba Cory, MD  hyoscyamine (LEVSIN) 0.125 MG tablet Take 1 tablet (0.125 mg total) by mouth every 6 (six) hours as needed. 11/24/16   Alba Cory, MD  linaclotide Karlene Einstein) 72 MCG capsule Take 1 capsule (72 mcg total) by mouth daily. 11/24/16   Alba Cory, MD  metoprolol tartrate (LOPRESSOR) 25 MG tablet Take 1 tablet (25 mg total) by mouth 2 (two) times daily. 01/02/17   Iran Ouch, MD     Allergies Patient has no known allergies.   Family History  Problem Relation Age of Onset  . Cancer Mother   . Diabetes Mother   . Cancer Father   . Diabetes Father   Positive for heart attack in her father in his late 55s.  Social  History Social History  Substance Use Topics  . Smoking status: Never Smoker  . Smokeless tobacco: Never Used  . Alcohol use No    Review of Systems  Constitutional:   No fever or chills.  ENT:   No sore throat. No rhinorrhea. Cardiovascular:   Positive as above for chest pain without syncope. Respiratory:   No dyspnea or cough. Gastrointestinal:   Negative for abdominal pain, vomiting and diarrhea.  Musculoskeletal:   Negative for focal pain or  swelling All other systems reviewed and are negative except as documented above in ROS and HPI.  ____________________________________________   PHYSICAL EXAM:  VITAL SIGNS: ED Triage Vitals  Enc Vitals Group     BP 02/20/17 1446 128/72     Pulse Rate 02/20/17 1446 (!) 58     Resp 02/20/17 1446 18     Temp 02/20/17 1446 98.4 F (36.9 C)     Temp Source 02/20/17 1446 Oral     SpO2 02/20/17 1446 100 %     Weight 02/20/17 1446 135 lb (61.2 kg)     Height 02/20/17 1446 5\' 4"  (1.626 m)     Head Circumference --      Peak Flow --      Pain Score 02/20/17 1500 7     Pain Loc --      Pain Edu? --      Excl. in GC? --     Vital signs reviewed, nursing assessments reviewed.   Constitutional:   Alert and oriented. Well appearing and in no distress. Eyes:   No scleral icterus.  EOMI. No nystagmus. No conjunctival pallor. PERRL. ENT   Head:   Normocephalic and atraumatic.   Nose:   No congestion/rhinnorhea.    Mouth/Throat:   MMM, no pharyngeal erythema. No peritonsillar mass.    Neck:   No meningismus. Full ROM Hematological/Lymphatic/Immunilogical:   No cervical lymphadenopathy. Cardiovascular:   RRR. Symmetric bilateral radial and DP pulses.  No murmurs.  Respiratory:   Normal respiratory effort without tachypnea/retractions. Breath sounds are clear and equal bilaterally. No wheezes/rales/rhonchi. Gastrointestinal:   Soft and nontender. Non distended. There is no CVA tenderness.  No rebound, rigidity, or guarding. Genitourinary:   deferred Musculoskeletal:   Normal range of motion in all extremities. No joint effusions.  No lower extremity tenderness.  No edema. Chest wall nontender Neurologic:   Normal speech and language.  Motor grossly intact. No gross focal neurologic deficits are appreciated.  Skin:    Skin is warm, dry and intact. No rash noted.  No petechiae, purpura, or bullae.  ____________________________________________    LABS (pertinent  positives/negatives) (all labs ordered are listed, but only abnormal results are displayed) Labs Reviewed  CBC - Abnormal; Notable for the following:       Result Value   RDW 14.9 (*)    All other components within normal limits  BASIC METABOLIC PANEL  TROPONIN I   ____________________________________________   EKG  Interpreted by me Sinus bradycardia rate of 54, normal axis and intervals. Poor R-wave progression in anterior precordial leads. Normal ST segments. Unremarkable T waves.  ____________________________________________    RADIOLOGY  Dg Chest 2 View  Result Date: 02/20/2017 CLINICAL DATA:  Shortness of breath. EXAM: CHEST  2 VIEW COMPARISON:  Radiographs of November 26, 2014. FINDINGS: The heart size and mediastinal contours are within normal limits. Both lungs are clear. No pneumothorax or pleural effusion is noted. The visualized skeletal structures are unremarkable. IMPRESSION: No active cardiopulmonary disease. Electronically  Signed   By: Lupita Raider, M.D.   On: 02/20/2017 15:29    ____________________________________________   PROCEDURES Procedures  ____________________________________________   INITIAL IMPRESSION / ASSESSMENT AND PLAN / ED COURSE  Pertinent labs & imaging results that were available during my care of the patient were reviewed by me and considered in my medical decision making (see chart for details).  Patient presents with exertional chest pain. EKG is nondiagnostic, troponin is negative, and patient does not have significant risk factors. However, history of present illness is concerning for cardiac chest pain and warrants further workup. I discussed with the hospitalists for further evaluation. Heart score indicates moderate risk.      ____________________________________________   FINAL CLINICAL IMPRESSION(S) / ED DIAGNOSES  Final diagnoses:  Exertional chest pain      New Prescriptions   No medications on file      Portions of this note were generated with dragon dictation software. Dictation errors may occur despite best attempts at proofreading.    Sharman Cheek, MD 02/20/17 1727    Sharman Cheek, MD 02/20/17 1728

## 2017-02-20 NOTE — Telephone Encounter (Signed)
Pt came into the office today asking to be seen. I met with her in the lobby. Pt c/o elevated HR at work today. States Fitbit recorded it at 124bpm. 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 At the time of her elevated HR, she was walking around outside then proceeded into the cottage to begin cleaning. She reports other symptoms at the time of right chest pain, right shoulder pain, back pain, and headache. All symptoms have resolved at this time except for right chest pressure. HR 62 per Fitbit while we are spealomg. She felt some palpitations as reported in April at PCP appt. States she feels palpitations every 2-3 weeks. Takes metoprolol 73m BID with no missed doses. She does not typically drink caffeinated beverages but had a medium cup of tea just before symptoms began.  Pt has known anxiety and intolerance to caffeine. States she wants someone to "check her out" today. As Dr. AFletcher Anonis not in clinic at this time, she states she would like to proceed to the ED. She does not appear to be in any distress at this time. I escorted pt to the Emergency Department.

## 2017-02-20 NOTE — Telephone Encounter (Signed)
Patient c/o Palpitations:  High priority if patient c/o lightheadedness and shortness of breath.   1. How long have you been having palpitations? This morning   2. Are you currently experiencing lightheadedness and shortness of breath? Yes, a little  3. Have you checked your BP and heart rate? (document readings) 124 heart rate  4. Are you experiencing any other symptoms? Lightheaded, dizzy, gets better with rest headache  Patient in lobby tearful and wants to be seen today nothing available

## 2017-02-20 NOTE — ED Triage Notes (Signed)
Had right chest pain with palpitations about 9am at work.  Had to sit down.  Has improved, but still there now. Pt in nad.

## 2017-02-21 DIAGNOSIS — F419 Anxiety disorder, unspecified: Secondary | ICD-10-CM | POA: Diagnosis not present

## 2017-02-21 DIAGNOSIS — G44221 Chronic tension-type headache, intractable: Secondary | ICD-10-CM | POA: Diagnosis not present

## 2017-02-21 DIAGNOSIS — R079 Chest pain, unspecified: Secondary | ICD-10-CM | POA: Diagnosis not present

## 2017-02-21 DIAGNOSIS — Z8679 Personal history of other diseases of the circulatory system: Secondary | ICD-10-CM | POA: Diagnosis not present

## 2017-02-21 DIAGNOSIS — R0789 Other chest pain: Secondary | ICD-10-CM | POA: Diagnosis not present

## 2017-02-21 DIAGNOSIS — I479 Paroxysmal tachycardia, unspecified: Secondary | ICD-10-CM | POA: Diagnosis not present

## 2017-02-21 DIAGNOSIS — R0602 Shortness of breath: Secondary | ICD-10-CM | POA: Diagnosis not present

## 2017-02-21 DIAGNOSIS — I471 Supraventricular tachycardia: Secondary | ICD-10-CM | POA: Diagnosis not present

## 2017-02-21 DIAGNOSIS — R002 Palpitations: Secondary | ICD-10-CM | POA: Diagnosis not present

## 2017-02-21 DIAGNOSIS — K219 Gastro-esophageal reflux disease without esophagitis: Secondary | ICD-10-CM | POA: Diagnosis not present

## 2017-02-21 DIAGNOSIS — F411 Generalized anxiety disorder: Secondary | ICD-10-CM | POA: Diagnosis not present

## 2017-02-21 DIAGNOSIS — K589 Irritable bowel syndrome without diarrhea: Secondary | ICD-10-CM | POA: Diagnosis not present

## 2017-02-21 DIAGNOSIS — K581 Irritable bowel syndrome with constipation: Secondary | ICD-10-CM | POA: Diagnosis not present

## 2017-02-21 DIAGNOSIS — E559 Vitamin D deficiency, unspecified: Secondary | ICD-10-CM | POA: Diagnosis not present

## 2017-02-21 LAB — TROPONIN I

## 2017-02-21 MED ORDER — METOPROLOL TARTRATE 25 MG PO TABS
12.5000 mg | ORAL_TABLET | Freq: Two times a day (BID) | ORAL | Status: DC
  Administered 2017-02-21: 12.5 mg via ORAL
  Filled 2017-02-21: qty 1

## 2017-02-21 MED ORDER — PANTOPRAZOLE SODIUM 40 MG PO TBEC: 40.0000 mg | DELAYED_RELEASE_TABLET | Freq: Every day | ORAL | 1 refills | Status: DC

## 2017-02-21 NOTE — Discharge Summary (Signed)
[facility] [place] at [facility]   PATIENT NAME: Chelsea Decker    MR#:  [id number]  DATE OF BIRTH:  [birth year]  DATE OF ADMISSION:  02/20/2017 ADMITTING PHYSICIAN: Milagros Loll, MD  DATE OF DISCHARGE: 02/21/2017  PRIMARY CARE PHYSICIAN: Alba Cory, MD    ADMISSION DIAGNOSIS:  SOB (shortness of breath) [R06.02] Exertional chest pain [R07.9]  DISCHARGE DIAGNOSIS:  Active Problems:   Chest pain   SECONDARY DIAGNOSIS:   Past Medical History:  Diagnosis Date  . Anxiety   . Asthma   . External hemorrhoids without mention of complication   . GERD (gastroesophageal reflux disease)   . Heart palpitations   . IBS (irritable bowel syndrome)   . Lump or mass in breast   . Palpitations   . PSVT (paroxysmal supraventricular tachycardia) (HCC) 04/12/2012  . Unspecified vitamin D deficiency     HOSPITAL COURSE:   ----Atypical chest pain- likely muscular pain On the right side of her chest, reproducible with palpation As per cardiologist- had negative stress test in past and CT chest does not show any calcification on coronaries. Troponin is negative. Unable to exclude anxiety as a contributor to her symptoms or musculoskeletal pain Suggested NSAIDs, icing  ----Tachycardia Notes indicating previous history of tachycardia, possible SVT Nothing documented on EKG or on telemetry overnight Unable to exclude anxiety is a contributor to her tachycardia Recommended she follow-up with Dr. Kirke Corin. If she continues to have symptoms, may need event monitor Suggested if she has tachycardia that she hydrate, sit, rest, avoid hot areas She could consider taking an extra half dose metoprolol for any paroxysmal tachycardia  ----Anxiety Likely contributing to her symptoms over the years and this admission Does not need significant workup given CT scan above, no coronary calcification, no PAD/aortic atherosclerosis Reassurance provided   DISCHARGE  CONDITIONS:   Stable.  CONSULTS OBTAINED:  Treatment Team:  Antonieta Iba, MD  DRUG ALLERGIES:  No Known Allergies  DISCHARGE MEDICATIONS:   Current Discharge Medication List    START taking these medications   Details  pantoprazole (PROTONIX) 40 MG tablet Take 1 tablet (40 mg total) by mouth daily. Qty: 30 tablet, Refills: 1      CONTINUE these medications which have NOT CHANGED   Details  acetaminophen (TYLENOL) 500 MG chewable tablet Chew 500 mg by mouth every 6 (six) hours as needed.    ALPRAZolam (XANAX XR) 0.5 MG 24 hr tablet Take 1 tablet (0.5 mg total) by mouth daily. Qty: 30 tablet, Refills: 2   Associated Diagnoses: Anxiety, generalized    aspirin 81 MG tablet Take 81 mg by mouth as needed.     butalbital-acetaminophen-caffeine (FIORICET WITH CODEINE) 50-325-40-30 MG capsule Take 1 capsule by mouth every 4 (four) hours as needed for headache. Qty: 30 capsule, Refills: 0   Associated Diagnoses: Chronic tension-type headache, intractable    Cholecalciferol (VITAMIN D) 2000 UNITS tablet Take 2,000 Units by mouth daily.    escitalopram (LEXAPRO) 10 MG tablet Take 1 tablet (10 mg total) by mouth daily. Qty: 30 tablet, Refills: 2   Associated Diagnoses: Anxiety, generalized; Panic attack    hyoscyamine (LEVSIN) 0.125 MG tablet Take 1 tablet (0.125 mg total) by mouth every 6 (six) hours as needed. Qty: 30 tablet, Refills: 0   Associated Diagnoses: Irritable bowel syndrome with constipation    linaclotide (LINZESS) 72 MCG capsule Take 1 capsule (72 mcg total) by mouth daily. Qty: 30 capsule, Refills: 2   Associated Diagnoses: Irritable bowel  syndrome with constipation    metoprolol tartrate (LOPRESSOR) 25 MG tablet Take 1 tablet (25 mg total) by mouth 2 (two) times daily. Qty: 60 tablet, Refills: 1   Associated Diagnoses: PSVT (paroxysmal supraventricular tachycardia) (HCC)         DISCHARGE INSTRUCTIONS:    Follow with PMD in 1-2 weeks.  If you  experience worsening of your admission symptoms, develop shortness of breath, life threatening emergency, suicidal or homicidal thoughts you must seek medical attention immediately by calling 911 or calling your MD immediately  if symptoms less severe.  You Must read complete instructions/literature along with all the possible adverse reactions/side effects for all the Medicines you take and that have been prescribed to you. Take any new Medicines after you have completely understood and accept all the possible adverse reactions/side effects.   Please note  You were cared for by a hospitalist during your hospital stay. If you have any questions about your discharge medications or the care you received while you were in the hospital after you are discharged, you can call the unit and asked to speak with the hospitalist on call if the hospitalist that took care of you is not available. Once you are discharged, your primary care physician will handle any further medical issues. Please note that NO REFILLS for any discharge medications will be authorized once you are discharged, as it is imperative that you return to your primary care physician (or establish a relationship with a primary care physician if you do not have one) for your aftercare needs so that they can reassess your need for medications and monitor your lab values.    Today   CHIEF COMPLAINT:   Chief Complaint  Patient presents with  . Chest Pain    HISTORY OF PRESENT ILLNESS:  Chelsea Decker  is a 56 y.o. female with a known history of SVT on metoprolol presents to the emergency room after she noticed chest pain and palpitations with exertion at work. 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 With minimal exertion she noticed right-sided chest pain along with tachycardia in the 120s on her fitted bit which improved with rest. This happened multiple times and she decided to come to the emergency room. Now she is laying  in bed has no pain. Heart rate is in the 50s. Troponin normal. EKG normal. Due to exertional chest pain and palpitations hospital is being consulted for admission.  Patient had a normal stress test in 2017 for similar chest pain. Follows with cardiology- [facility]  VITAL SIGNS:  Blood pressure 116/65, pulse (!) 56, temperature 97.8 F (36.6 C), temperature source Oral, resp. rate 20, height 5\' 4"  (1.626 m), weight 61.2 kg (135 lb), SpO2 100 %.  I/O:   Intake/Output Summary (Last 24 hours) at 02/21/17 1438 Last data filed at 02/21/17 1314  Gross per 24 hour  Intake              360 ml  Output              400 ml  Net              -40 ml    PHYSICAL EXAMINATION:  GENERAL:  56 y.o.-year-old patient lying in the bed with no acute distress.  EYES: Pupils equal, round, reactive to light and accommodation. No scleral icterus. Extraocular muscles intact.  HEENT: Head atraumatic, normocephalic. Oropharynx and nasopharynx clear.  NECK:  Supple, no jugular venous distention. No thyroid enlargement, no  tenderness.  LUNGS: Normal breath sounds bilaterally, no wheezing, rales,rhonchi or crepitation. No use of accessory muscles of respiration.  CARDIOVASCULAR: S1, S2 normal. No murmurs, rubs, or gallops.  ABDOMEN: Soft, non-tender, non-distended. Bowel sounds present. No organomegaly or mass.  EXTREMITIES: No pedal edema, cyanosis, or clubbing.  NEUROLOGIC: Cranial nerves II through XII are intact. Muscle strength 5/5 in all extremities. Sensation intact. Gait not checked.  PSYCHIATRIC: The patient is alert and oriented x 3.  SKIN: No obvious rash, lesion, or ulcer.   DATA REVIEW:   CBC  Recent Labs Lab 02/20/17 1451  WBC 8.0  HGB 12.5  HCT 36.8  PLT 151    Chemistries   Recent Labs Lab 02/20/17 1451  NA 140  K 3.9  CL 105  CO2 29  GLUCOSE 83  BUN 15  CREATININE 0.83  CALCIUM 10.0    Cardiac Enzymes  Recent Labs Lab 02/21/17 0155  TROPONINI <0.03    Microbiology  Results  Results for orders placed or performed in visit on 03/29/14  Urine culture     Status: None   Collection Time: 03/29/14 11:04 AM  Result Value Ref Range Status   Micro Text Report   Final       SOURCE: CLEAN CATCH    ORGANISM 1                30,000 CFU/ML ESCHERICHIA COLI   COMMENT                   WITH MIXED-BACTERIAL-ORGANISMS   ANTIBIOTIC                    ORG#1     AMPICILLIN                    S         CEFAZOLIN                     S         CEFOXITIN                     S         CEFTRIAXONE                   S         CIPROFLOXACIN                 S         GENTAMICIN                    S         IMIPENEM                      S         LEVOFLOXACIN                  S         NITROFURANTOIN                S         TRIMETHOPRIM/SULFAMETHOXAZOLE S             RADIOLOGY:  Dg Chest 2 View  Result Date: 02/20/2017 CLINICAL DATA:  Shortness of breath. EXAM: CHEST  2 VIEW COMPARISON:  Radiographs of November 26, 2014. FINDINGS: The heart size and mediastinal contours are within normal limits. Both  lungs are clear. No pneumothorax or pleural effusion is noted. The visualized skeletal structures are unremarkable. IMPRESSION: No active cardiopulmonary disease. Electronically Signed   By: Lupita Raider, M.D.   On: 02/20/2017 15:29   Ct Angio Chest Pe W Or Wo Contrast  Result Date: 02/20/2017 CLINICAL DATA:  Sudden onset of right-sided chest pain starting this morning. Pain radiating to jaw and right shoulder. EXAM: CT ANGIOGRAPHY CHEST WITH CONTRAST TECHNIQUE: Multidetector CT imaging of the chest was performed using the standard protocol during bolus administration of intravenous contrast. Multiplanar CT image reconstructions and MIPs were obtained to evaluate the vascular anatomy. CONTRAST:  75 cc Isovue 370 COMPARISON:  None. FINDINGS: Cardiovascular: There is no pulmonary embolism identified within the main, lobar or segmental pulmonary arteries bilaterally. No aortic  aneurysm.  Heart size is normal.  No pericardial effusion. Mediastinum/Nodes: No mass or enlarged lymph nodes within the mediastinum or perihilar regions. Esophagus is unremarkable. Trachea and central bronchi are unremarkable. Minimal soft tissue density material within the anterior mediastinum is likely residual thymic tissue, of unlikely clinical significance. Lungs/Pleura: Lungs are clear.  No pleural effusion or pneumothorax. Upper Abdomen: Limited images of the upper abdomen are unremarkable. Musculoskeletal: No chest wall abnormality. No acute or significant osseous findings. Review of the MIP images confirms the above findings. IMPRESSION: No acute or significant findings. No pulmonary embolism seen. Lungs are clear. Electronically Signed   By: Bary Richard M.D.   On: 02/20/2017 20:35    EKG:   Orders placed or performed during the hospital encounter of 02/20/17  . EKG 12-Lead  . EKG 12-Lead  . ED EKG within 10 minutes  . ED EKG within 10 minutes      Management plans discussed with the patient, family and they are in agreement.  CODE STATUS:     Code Status Orders        Start     Ordered   02/20/17 1802  Full code  Continuous     02/20/17 1803    Code Status History    Date Active Date Inactive Code Status Order ID Comments User Context   This patient has a current code status but no historical code status.      TOTAL TIME TAKING CARE OF THIS PATIENT: 35 minutes.    Altamese Dilling M.D on 02/21/2017 at 2:38 PM  Between 7am to 6pm - Pager - [phone]  After 6pm go to [email or web address] - Social research officer, government  [facility]  Office  [phone]  CC: Primary care physician; Alba Cory, MD   Note: This dictation was prepared with Dragon dictation along with smaller phrase technology. Any transcriptional errors that result from this process are unintentional.

## 2017-02-21 NOTE — Progress Notes (Signed)
Patient discharged via wheelchair and private vehicle. IV removed and catheter intact. All discharge instructions given and patient verbalizes understanding. Tele removed and returned. No prescriptions given to patient No distress noted.   

## 2017-02-21 NOTE — Discharge Instructions (Signed)
Nonspecific Chest Pain °Chest pain can be caused by many different conditions. There is always a chance that your pain could be related to something serious, such as a heart attack or a blood clot in your lungs. Chest pain can also be caused by conditions that are not life-threatening. If you have chest pain, it is very important to follow up with your health care provider. °What are the causes? °Causes of this condition include: °· Heartburn. °· Pneumonia or bronchitis. °· Anxiety or stress. °· Inflammation around your heart (pericarditis) or lung (pleuritis or pleurisy). °· A blood clot in your lung. °· A collapsed lung (pneumothorax). This can develop suddenly on its own (spontaneous pneumothorax) or from trauma to the chest. °· Shingles infection (varicella-zoster virus). °· Heart attack. °· Damage to the bones, muscles, and cartilage that make up your chest wall. This can include: °? Bruised bones due to injury. °? Strained muscles or cartilage due to frequent or repeated coughing or overwork. °? Fracture to one or more ribs. °? Sore cartilage due to inflammation (costochondritis). ° °What increases the risk? °Risk factors for this condition may include: °· Activities that increase your risk for trauma or injury to your chest. °· Respiratory infections or conditions that cause frequent coughing. °· Medical conditions or overeating that can cause heartburn. °· Heart disease or family history of heart disease. °· Conditions or health behaviors that increase your risk of developing a blood clot. °· Having had chicken pox (varicella zoster). ° °What are the signs or symptoms? °Chest pain can feel like: °· Burning or tingling on the surface of your chest or deep in your chest. °· Crushing, pressure, aching, or squeezing pain. °· Dull or sharp pain that is worse when you move, cough, or take a deep breath. °· Pain that is also felt in your back, neck, shoulder, or arm, or pain that spreads to any of these  areas. ° °Your chest pain may come and go, or it may stay constant. °How is this diagnosed? °Lab tests or other studies may be needed to find the cause of your pain. Your health care provider may have you take a test called an ECG (electrocardiogram). An ECG records your heartbeat patterns at the time the test is performed. You may also have other tests, such as: °· Transthoracic echocardiogram (TTE). In this test, sound waves are used to create a picture of the heart structures and to look at how blood flows through your heart. °· Transesophageal echocardiogram (TEE). This is a more advanced imaging test that takes images from inside your body. It allows your health care provider to see your heart in finer detail. °· Cardiac monitoring. This allows your health care provider to monitor your heart rate and rhythm in real time. °· Holter monitor. This is a portable device that records your heartbeat and can help to diagnose abnormal heartbeats. It allows your health care provider to track your heart activity for several days, if needed. °· Stress tests. These can be done through exercise or by taking medicine that makes your heart beat more quickly. °· Blood tests. °· Other imaging tests. ° °How is this treated? °Treatment depends on what is causing your chest pain. Treatment may include: °· Medicines. These may include: °? Acid blockers for heartburn. °? Anti-inflammatory medicine. °? Pain medicine for inflammatory conditions. °? Antibiotic medicine, if an infection is present. °? Medicines to dissolve blood clots. °? Medicines to treat coronary artery disease (CAD). °· Supportive care for conditions that   do not require medicines. This may include: ? Resting. ? Applying heat or cold packs to injured areas. ? Limiting activities until pain decreases.  Follow these instructions at home: Medicines  If you were prescribed an antibiotic, take it as told by your health care provider. Do not stop taking the  antibiotic even if you start to feel better.  Take over-the-counter and prescription medicines only as told by your health care provider. Lifestyle  Do not use any products that contain nicotine or tobacco, such as cigarettes and e-cigarettes. If you need help quitting, ask your health care provider.  Do not drink alcohol.  Make lifestyle changes as directed by your health care provider. These may include: ? Getting regular exercise. Ask your health care provider to suggest some activities that are safe for you. ? Eating a heart-healthy diet. A registered dietitian can help you to learn healthy eating options. ? Maintaining a healthy weight. ? Managing diabetes, if necessary. ? Reducing stress, such as with yoga or relaxation techniques. General instructions  Avoid any activities that bring on chest pain.  If heartburn is the cause for your chest pain, raise (elevate) the head of your bed about 6 inches (15 cm) by putting blocks under the legs. Sleeping with more pillows does not effectively relieve heartburn because it only changes the position of your head.  Keep all follow-up visits as told by your health care provider. This is important. This includes any further testing if your chest pain does not go away. Contact a health care provider if:  Your chest pain does not go away.  You have a rash with blisters on your chest.  You have a fever.  You have chills. Get help right away if:  Your chest pain is worse.  You have a cough that gets worse, or you cough up blood.  You have severe pain in your abdomen.  You have severe weakness.  You faint.  You have sudden, unexplained chest discomfort.  You have sudden, unexplained discomfort in your arms, back, neck, or jaw.  You have shortness of breath at any time.  You suddenly start to sweat, or your skin gets clammy.  You feel nauseous or you vomit.  You suddenly feel light-headed or dizzy.  Your heart begins to beat  quickly, or it feels like it is skipping beats. These symptoms may represent a serious problem that is an emergency. Do not wait to see if the symptoms will go away. Get medical help right away. Call your local emergency services (911 in the [place] Do not drive yourself to the hospital. This information is not intended to replace advice given to you by your health care provider. Make sure you discuss any questions you have with your health care provider. Document Released: 05/10/2005 Document Revised: 04/24/2016 Document Reviewed: 04/24/2016 Elsevier Interactive Patient Education  2017 [employer] Pantoprazole tablets What is this medicine? PANTOPRAZOLE (pan TOE pra zole) prevents the production of acid in the stomach. It is used to treat gastroesophageal reflux disease (GERD), inflammation of the esophagus, and Zollinger-Ellison syndrome. This medicine may be used for other purposes; ask your health care provider or pharmacist if you have questions. COMMON BRAND NAME(S): Protonix What should I tell my health care provider before I take this medicine? They need to know if you have any of these conditions: -liver disease -low levels of magnesium in the blood -lupus -an unusual or allergic reaction to omeprazole, lansoprazole, pantoprazole, rabeprazole, other medicines, foods, dyes, or preservatives -  pregnant or trying to get pregnant -breast-feeding How should I use this medicine? Take this medicine by mouth. Swallow the tablets whole with a drink of water. Follow the directions on the prescription label. Do not crush, break, or chew. Take your medicine at regular intervals. Do not take your medicine more often than directed. Talk to your pediatrician regarding the use of this medicine in children. While this drug may be prescribed for children as young as 5 years for selected conditions, precautions do apply. Overdosage: If you think you have taken too much of this medicine contact a poison  control center or emergency room at once. NOTE: This medicine is only for you. Do not share this medicine with others. What if I miss a dose? If you miss a dose, take it as soon as you can. If it is almost time for your next dose, take only that dose. Do not take double or extra doses. What may interact with this medicine? Do not take this medicine with any of the following medications: -atazanavir -nelfinavir This medicine may also interact with the following medications: -ampicillin -delavirdine -erlotinib -iron salts -medicines for fungal infections like ketoconazole, itraconazole and voriconazole -methotrexate -mycophenolate mofetil -warfarin This list may not describe all possible interactions. Give your health care provider a list of all the medicines, herbs, non-prescription drugs, or dietary supplements you use. Also tell them if you smoke, drink alcohol, or use illegal drugs. Some items may interact with your medicine. What should I watch for while using this medicine? It can take several days before your stomach pain gets better. Check with your doctor or health care professional if your condition does not start to get better, or if it gets worse. You may need blood work done while you are taking this medicine. What side effects may I notice from receiving this medicine? Side effects that you should report to your doctor or health care professional as soon as possible: -allergic reactions like skin rash, itching or hives, swelling of the face, lips, or tongue -bone, muscle or joint pain -breathing problems -chest pain or chest tightness -dark yellow or brown urine -dizziness -fast, irregular heartbeat -feeling faint or lightheaded -fever or sore throat -muscle spasm -palpitations -rash on cheeks or arms that gets worse in the sun -redness, blistering, peeling or loosening of the skin, including inside the mouth -seizures -tremors -unusual bleeding or  bruising -unusually weak or tired -yellowing of the eyes or skin Side effects that usually do not require medical attention (report to your doctor or health care professional if they continue or are bothersome): -constipation -diarrhea -dry mouth -headache -nausea This list may not describe all possible side effects. Call your doctor for medical advice about side effects. You may report side effects to FDA at 1-800-FDA-1088. Where should I keep my medicine? Keep out of the reach of children. Store at room temperature between 15 and 30 degrees C (59 and 86 degrees F). Protect from light and moisture. Throw away any unused medicine after the expiration date. NOTE: This sheet is a summary. It may not cover all possible information. If you have questions about this medicine, talk to your doctor, pharmacist, or health care provider.  2018 Elsevier/Gold Standard (2015-09-02 12:20:19)

## 2017-02-21 NOTE — Progress Notes (Signed)
  February 21, 2017  Patient: Chelsea Decker  Date of Birth: 14-Mar-1961  Date of Visit: 02/20/2017    To Whom It May Concern:  Crista ElliotVanessa Cheslock was [facility] and [facility] 02/20/2017-02/22/2007. Chelsea LotVanessa M Paiz may return to work 02/22/2017  Sincerely,   Dr. Elisabeth PigeonVachhani

## 2017-02-21 NOTE — Consult Note (Signed)
Cardiology Consult    Patient ID: Chelsea Decker MRN: [id number], DOB/AGE: 1960/11/17   Admit date: 02/20/2017 Date of Consult: 02/21/2017  Primary Physician: Alba Cory, MD Reason for Consult: Chest Pain Primary Cardiologist: Dr. Kirke Corin Requesting Provider: Dr. Elpidio Anis   History of Present Illness    Chelsea Decker is a 56 y.o. female with past medical history of pSVT, anxiety, and GERD who is being seen today for the evaluation of chest pain at the request of Dr. Elpidio Anis.  She was last examined by Dr. Kirke Corin in 02/2016 and reported doing well from a cardiac perspective at that time. She had experienced episodes of chest pain in the past and had undergone multiple stress tests, with the most recent stress test being in  01/2015 and showed no evidence of ischemia, overall being a low-risk study.   She presented directly to the office yesterday for evaluation of palpitations. Reported having an episode of palpitations while at work with HR of 124 (per her Fitbit). Also noted a mild episode of right chest pressure. While in the office, her HR was at 62 on her Fitbit. No appointment slots were available, therefore she proceeded to the ED for further evaluation.   In talking with the patient, she reports having episodes of right-sided, stabbing pain occurring yesterday morning while working. 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 She says her HR was elevated at that time and she noted palpitations that felt similar to episodes of palpitations she had in the past. The episode lasted for about 20 minutes and improved with her sitting down. Since admission, she denies any recurrent chest pain or palpitations since admission.   Initial labs show WBC of 8.0, Hgb 12.5, platelets 151. Na+ 140, K+ 3.9, creatinine 0.83. Cyclic troponin values have been negative. CXR shows no active cardiopulmonary disease. CTA  shows no evidence of a PE. EKG shows sinus bradycardia, HR 54, with no acute ST or T-wave changes. HR has been in the mid-40's to 60's on telemetry.    Past Medical History   Past Medical History:  Diagnosis Date  . Anxiety   . Asthma   . External hemorrhoids without mention of complication   . GERD (gastroesophageal reflux disease)   . Heart palpitations   . IBS (irritable bowel syndrome)   . Lump or mass in breast   . Palpitations   . PSVT (paroxysmal supraventricular tachycardia) (HCC) 04/12/2012  . Unspecified vitamin D deficiency     Past Surgical History:  Procedure Laterality Date  . ABDOMINAL HYSTERECTOMY     2013  . BREAST BIOPSY Left 2013   core w/clip - neg  . LAPAROSCOPIC TOTAL HYSTERECTOMY       Allergies  No Known Allergies  Inpatient Medications    . aspirin EC  81 mg Oral Daily  . enoxaparin (LOVENOX) injection  40 mg Subcutaneous Q24H  . sodium chloride flush  3 mL Intravenous Q12H    Family History    Family History  Problem Relation Age of Onset  . Cancer Mother   . Diabetes Mother   . Cancer Father   . Diabetes Father     Social History    Social History   Social History  . Marital status: Divorced    Spouse name: N/A  . Number of children: N/A  . Years of education: N/A   Occupational History  . Not on  file.   Social History Main Topics  . Smoking status: Never Smoker  . Smokeless tobacco: Never Used  . Alcohol use No  . Drug use: No  . Sexual activity: No   Other Topics Concern  . Not on file   Social History Narrative  . No narrative on file     Review of Systems    General:  No chills, fever, night sweats or weight changes.  Cardiovascular:  No dyspnea on exertion, edema, orthopnea, paroxysmal nocturnal dyspnea. Positive for chest pain and palpitations.  Dermatological: No rash, lesions/masses Respiratory: No cough, dyspnea Urologic: No hematuria, dysuria Abdominal:   No nausea, vomiting, diarrhea, bright red  blood per rectum, melena, or hematemesis Neurologic:  No visual changes, wkns, changes in mental status. All other systems reviewed and are otherwise negative except as noted above.  Physical Exam    Blood pressure 111/65, pulse (!) 46, temperature 97.7 F (36.5 C), temperature source Oral, resp. rate 16, height 5\' 4"  (1.626 m), weight 135 lb (61.2 kg), SpO2 100 %.  General: Pleasant African American female appearing in NAD Psych: Normal affect. Neuro: Alert and oriented X 3. Moves all extremities spontaneously. HEENT: Normal  Neck: Supple without bruits or JVD. Lungs:  Resp regular and unlabored, CTA without wheezing or rales. Heart: RRR no s3, s4, or murmurs. Abdomen: Soft, non-tender, non-distended, BS + x 4.  Extremities: No clubbing, cyanosis or edema. DP/PT/Radials 2+ and equal bilaterally.  Labs    Troponin (Point of Care Test) No results for input(s): TROPIPOC in the last 72 hours.  Recent Labs  02/20/17 1451 02/20/17 2049 02/21/17 0155  TROPONINI <0.03 <0.03 <0.03   Lab Results  Component Value Date   WBC 8.0 02/20/2017   HGB 12.5 02/20/2017   HCT 36.8 02/20/2017   MCV 88.6 02/20/2017   PLT 151 02/20/2017    Recent Labs Lab 02/20/17 1451  NA 140  K 3.9  CL 105  CO2 29  BUN 15  CREATININE 0.83  CALCIUM 10.0  GLUCOSE 83   Lab Results  Component Value Date   CHOL 189 11/27/2016   HDL 65 11/27/2016   LDLCALC 114 (H) 11/27/2016   TRIG 49 11/27/2016   No results found for: [facility]   Radiology Studies    Dg Chest 2 View  Result Date: 02/20/2017 CLINICAL DATA:  Shortness of breath. EXAM: CHEST  2 VIEW COMPARISON:  Radiographs of November 26, 2014. FINDINGS: The heart size and mediastinal contours are within normal limits. Both lungs are clear. No pneumothorax or pleural effusion is noted. The visualized skeletal structures are unremarkable. IMPRESSION: No active cardiopulmonary disease. Electronically Signed   By: Lupita Raider, M.D.   On: 02/20/2017  15:29   Ct Angio Chest Pe W Or Wo Contrast  Result Date: 02/20/2017 CLINICAL DATA:  Sudden onset of right-sided chest pain starting this morning. Pain radiating to jaw and right shoulder. EXAM: CT ANGIOGRAPHY CHEST WITH CONTRAST TECHNIQUE: Multidetector CT imaging of the chest was performed using the standard protocol during bolus administration of intravenous contrast. Multiplanar CT image reconstructions and MIPs were obtained to evaluate the vascular anatomy. CONTRAST:  75 cc Isovue 370 COMPARISON:  None. FINDINGS: Cardiovascular: There is no pulmonary embolism identified within the main, lobar or segmental pulmonary arteries bilaterally. No aortic aneurysm.  Heart size is normal.  No pericardial effusion. Mediastinum/Nodes: No mass or enlarged lymph nodes within the mediastinum or perihilar regions. Esophagus is unremarkable. Trachea and central bronchi are unremarkable. Minimal soft  tissue density material within the anterior mediastinum is likely residual thymic tissue, of unlikely clinical significance. Lungs/Pleura: Lungs are clear.  No pleural effusion or pneumothorax. Upper Abdomen: Limited images of the upper abdomen are unremarkable. Musculoskeletal: No chest wall abnormality. No acute or significant osseous findings. Review of the MIP images confirms the above findings. IMPRESSION: No acute or significant findings. No pulmonary embolism seen. Lungs are clear. Electronically Signed   By: Bary RichardStan  Maynard M.D.   On: 02/20/2017 20:35    EKG & Cardiac Imaging    EKG:  Sinus bradycardia, HR 54, with no acute ST or T-wave changes.   - Personally Reviewed  Echocardiogram: 02/2012 Study Conclusions  - Left ventricle: The cavity size was normal. Wall thickness was normal. Systolic function was normal. The estimated ejection fraction was in the range of 60% to 65%. Wall motion was normal; there were no regional wall motion abnormalities. Left ventricular diastolic function parameters  were normal. - Pulmonary arteries: Systolic pressure was within the normal range. Impressions:  - Normal study.  NST: 01/18/2015  There was no ST segment deviation noted during stress.  T wave inversion was noted during stress, beginning at 1 minutes of stress. T wave inversion persisted. 1:01 ECG w/ twi in V3-5 w/ T flattening in V6. TWI in V3-4 persists for remainder of study w/ improvement in flattening in V5-6 by 3:59 ECG.  The study is normal.  This is a low risk study.  The left ventricular ejection fraction is normal (55-65%).  Defect 1: There is a small defect of mild severity present in the apex location. This is likely due to breast attenuation.  Assessment & Plan    1. Atypical Chest Pain - she has a history of recurrent chest pain, with a recent NST in 01/2015 being low-risk and showing no evidence of ischemia. She developed palpitations and right-sided chest pain while at work yesterday. Reports she was warm secondary to performing physical labor and her symptoms improved when sitting down. - she denies any repeat episodes of chest pain or palpitations since admission. Cyclic troponin values have been negative. CTA shows no evidence of a PE. CTA also personally reviewed by Dr. Mariah MillingGollan and no coronary calcifications were noted. EKG shows sinus bradycardia, HR 54, with no acute ST or T-wave changes.  - overall, her symptoms seem atypical for a cardiac etiology. With her recent normal NST and CTA showing no coronary calcifications, would not pursue further ischemic evaluation at this time. She was encouraged to ambulate around the unit later this morning.   2. Palpitations/History of SVT - she has known pSVT and is on Lopressor 25mg  BID as an outpatient.  - HR has been in the mid-40's to 60's this admission. She only notes occasional episodes of dizziness and says the lowest her HR gets during the day is in the mid-50's. Can resume Lopressor at PTA dosing at time of  discharge. She was instructed to cut the pills in half if her dizziness becomes a frequent issue and take 12.5mg  BID.   3. Anxiety - on Lexapro and Xanax as an outpatient.   Signed, Ellsworth LennoxBrittany M Lonza Shimabukuro, PA-C 02/21/2017, 7:41 AM Pager: [phone]

## 2017-02-22 ENCOUNTER — Ambulatory Visit (INDEPENDENT_AMBULATORY_CARE_PROVIDER_SITE_OTHER): Payer: 59 | Admitting: Family Medicine

## 2017-02-22 ENCOUNTER — Encounter: Payer: Self-pay | Admitting: Family Medicine

## 2017-02-22 VITALS — BP 120/72 | HR 74 | Temp 98.2°F | Resp 16 | Ht 64.0 in | Wt 126.1 lb

## 2017-02-22 DIAGNOSIS — R079 Chest pain, unspecified: Secondary | ICD-10-CM | POA: Diagnosis not present

## 2017-02-22 DIAGNOSIS — Z09 Encounter for follow-up examination after completed treatment for conditions other than malignant neoplasm: Secondary | ICD-10-CM | POA: Diagnosis not present

## 2017-02-22 DIAGNOSIS — F411 Generalized anxiety disorder: Secondary | ICD-10-CM | POA: Diagnosis not present

## 2017-02-22 DIAGNOSIS — I471 Supraventricular tachycardia: Secondary | ICD-10-CM

## 2017-02-22 LAB — HIV ANTIBODY (ROUTINE TESTING W REFLEX): HIV Screen 4th Generation wRfx: NONREACTIVE

## 2017-02-22 NOTE — Progress Notes (Signed)
Name: Chelsea Decker   MRN: [id number]    DOB: September 26, 1960   Date:02/22/2017       Progress Note  Subjective  Chief Complaint  Chief Complaint  Patient presents with  . Hospitalization Follow-up    HPI  Hospital discharge follow up: she was admitted on July 10th and discharged on July 11th. She has a long history of anxiety and previous episodes of panic attacks were always at work. She has been taking Citalopram and Alprazolam XR, but based on fill dates on the bottles she brought in, she does not seem to be taking it daily. She feels overwhelmed at work, it is harder during the Summer because of the heat. She developed right side sharp  chest pain while at work, glanced at the fit bit and HR was 124 felt anxious, and a sensation of doom, some nausea, palpitation. She states first episode lasted about 20 minutes, had 30 minutes of symptom free and went [place] another cottage to [place] ( [place]) and had same symptoms that lasted about 20 minutes and decided to go to Dr. Tyrell Antonio office and was given option to be seen next week or go to [facility]. She went to [facility] and had negative CXR, PE CT scan, troponin, comp panel and CBC. She was evaluated by Dr. Rockey Situ, and discharge home by hospitalist on PPI and close follow up with cardiologist.    Patient Active Problem List   Diagnosis Date Noted  . Chest pain 02/20/2017  . Post-menopause on HRT (hormone replacement therapy) 11/05/2015  . Fasting hyperglycemia 08/10/2015  . Anxiety, generalized 03/20/2015  . Bradycardia 03/20/2015  . Chronic tension-type headache, intractable 03/20/2015  . Gastro-esophageal reflux disease without esophagitis 03/20/2015  . IBS (irritable bowel syndrome) 03/20/2015  . Menopause 03/20/2015  . Vitamin D deficiency 03/20/2015  . Vasovagal syncope 08/18/2014  . PSVT (paroxysmal supraventricular tachycardia) ([place]) 04/12/2012  . Lump or mass in breast 04/01/2009  . External hemorrhoids 07/15/2007    Past  Surgical History:  Procedure Laterality Date  . ABDOMINAL HYSTERECTOMY     2013  . BREAST BIOPSY Left 2013   core w/clip - neg  . LAPAROSCOPIC TOTAL HYSTERECTOMY      Family History  Problem Relation Age of Onset  . Cancer Mother   . Diabetes Mother   . Cancer Father   . Diabetes Father     Social History   Social History  . Marital status: Divorced    Spouse name: N/A  . Number of children: N/A  . Years of education: N/A   Occupational History  . Not on file.   Social History Main Topics  . Smoking status: Never Smoker  . Smokeless tobacco: Never Used  . Alcohol use No  . Drug use: No  . Sexual activity: No   Other Topics Concern  . Not on file   Social History Narrative  . No narrative on file     Current Outpatient Prescriptions:  .  acetaminophen (TYLENOL) 500 MG chewable tablet, Chew 500 mg by mouth every 6 (six) hours as needed., Disp: , Rfl:  .  ALPRAZolam (XANAX XR) 0.5 MG 24 hr tablet, Take 1 tablet (0.5 mg total) by mouth daily., Disp: 30 tablet, Rfl: 2 .  aspirin 81 MG tablet, Take 81 mg by mouth as needed. , Disp: , Rfl:  .  butalbital-acetaminophen-caffeine (FIORICET WITH CODEINE) 50-325-40-30 MG capsule, Take 1 capsule by mouth every 4 (four) hours as needed for headache., Disp: 30  capsule, Rfl: 0 .  Cholecalciferol (VITAMIN D) 2000 UNITS tablet, Take 2,000 Units by mouth daily., Disp: , Rfl:  .  escitalopram (LEXAPRO) 10 MG tablet, Take 1 tablet (10 mg total) by mouth daily., Disp: 30 tablet, Rfl: 2 .  hyoscyamine (LEVSIN) 0.125 MG tablet, Take 1 tablet (0.125 mg total) by mouth every 6 (six) hours as needed., Disp: 30 tablet, Rfl: 0 .  linaclotide (LINZESS) 72 MCG capsule, Take 1 capsule (72 mcg total) by mouth daily. (Patient taking differently: Take 72 mcg by mouth daily as needed. ), Disp: 30 capsule, Rfl: 2 .  metoprolol tartrate (LOPRESSOR) 25 MG tablet, Take 1 tablet (25 mg total) by mouth 2 (two) times daily., Disp: 60 tablet, Rfl: 1 .   pantoprazole (PROTONIX) 40 MG tablet, Take 1 tablet (40 mg total) by mouth daily., Disp: 30 tablet, Rfl: 1  No Known Allergies   ROS  Constitutional: Negative for fever or weight change.  Respiratory: Negative for cough and shortness of breath.   Cardiovascular: Negative for chest pain or palpitations.  Gastrointestinal: Negative for abdominal pain, no bowel changes.  Musculoskeletal: Negative for gait problem or joint swelling.  Skin: Negative for rash.  Neurological: Negative for dizziness or headache.  No other specific complaints in a complete review of systems (except as listed in HPI above).  Objective  Vitals:   02/22/17 0752  BP: 120/72  Pulse: 74  Resp: 16  Temp: 98.2 F (36.8 C)  TempSrc: Oral  SpO2: 97%  Weight: 126 lb 1.6 oz (57.2 kg)  Height: 5' 4"  (1.626 m)    Body mass index is 21.65 kg/m.  Physical Exam  Constitutional: Patient appears well-developed and well-nourished. No distress.  HEENT: head atraumatic, normocephalic, pupils equal and reactive to light,neck supple, throat within normal limits Cardiovascular: Normal rate, regular rhythm and normal heart sounds.  No murmur heard. No BLE edema. Pulmonary/Chest: Effort normal and breath sounds normal. No respiratory distress. Abdominal: Soft.  There is no tenderness. Psychiatric: Patient has a normal mood and affect. behavior is normal. Judgment and thought content normal.  Recent Results (from the past 2160 hour(s))  Hemoglobin A1c     Status: None   Collection Time: 11/27/16  8:19 AM  Result Value Ref Range   Hgb A1c MFr Bld 5.6 <5.7 %    Comment:   For the purpose of screening for the presence of diabetes:   <5.7%       Consistent with the absence of diabetes 5.7-6.4 %   Consistent with increased risk for diabetes (prediabetes) >=6.5 %     Consistent with diabetes   This assay result is consistent with a decreased risk of diabetes.   Currently, no consensus exists regarding use of hemoglobin  A1c for diagnosis of diabetes in children.   According to American Diabetes Association (ADA) guidelines, hemoglobin A1c <7.0% represents optimal control in non-pregnant diabetic patients. Different metrics may apply to specific patient populations. Standards of Medical Care in Diabetes (ADA).      Mean Plasma Glucose 114 mg/dL  Insulin, fasting     Status: None   Collection Time: 11/27/16  8:19 AM  Result Value Ref Range   Insulin fasting, serum 4.7 2.0 - 19.6 uIU/mL    Comment:   This insulin assay shows strong cross-reactivity for some insulin analogs (lispro, aspart, and glargine) and much lower cross-reactivity with others (detemir, glulisine).   Stimulated Insulin reference intervals were established using the Siemens Immulite assay. These values are provided for  general guidance only.   Lipid panel     Status: Abnormal   Collection Time: 11/27/16  8:19 AM  Result Value Ref Range   Cholesterol 189 <200 mg/dL   Triglycerides 49 <150 mg/dL   HDL 65 >50 mg/dL   Total CHOL/HDL Ratio 2.9 <5.0 Ratio   VLDL 10 <30 mg/dL   LDL Cholesterol 114 (H) <100 mg/dL  COMPLETE METABOLIC PANEL WITH GFR     Status: None   Collection Time: 11/27/16  8:19 AM  Result Value Ref Range   Sodium 142 135 - 146 mmol/L   Potassium 3.6 3.5 - 5.3 mmol/L   Chloride 108 98 - 110 mmol/L   CO2 27 20 - 31 mmol/L   Glucose, Bld 86 65 - 99 mg/dL   BUN 15 7 - 25 mg/dL   Creat 0.58 0.50 - 1.05 mg/dL    Comment:   For patients > or = 56 years of age: The upper reference limit for Creatinine is approximately 13% higher for people identified as African-American.      Total Bilirubin 0.4 0.2 - 1.2 mg/dL   Alkaline Phosphatase 57 33 - 130 U/L   AST 15 10 - 35 U/L   ALT 10 6 - 29 U/L   Total Protein 6.8 6.1 - 8.1 g/dL   Albumin 4.0 3.6 - 5.1 g/dL   Calcium 9.3 8.6 - 10.4 mg/dL   GFR, Est African American >89 >=60 mL/min   GFR, Est Non African American >89 >=60 mL/min  Basic metabolic panel      Status: None   Collection Time: 02/20/17  2:51 PM  Result Value Ref Range   Sodium 140 135 - 145 mmol/L   Potassium 3.9 3.5 - 5.1 mmol/L   Chloride 105 101 - 111 mmol/L   CO2 29 22 - 32 mmol/L   Glucose, Bld 83 65 - 99 mg/dL   BUN 15 6 - 20 mg/dL   Creatinine, Ser 0.83 0.44 - 1.00 mg/dL   Calcium 10.0 8.9 - 10.3 mg/dL   GFR calc non Af Amer >60 >60 mL/min   GFR calc Af Amer >60 >60 mL/min    Comment: (NOTE) The eGFR has been calculated using the CKD EPI equation. This calculation has not been validated in all clinical situations. eGFR's persistently <60 mL/min signify possible Chronic Kidney Disease.    Anion gap 6 5 - 15  CBC     Status: Abnormal   Collection Time: 02/20/17  2:51 PM  Result Value Ref Range   WBC 8.0 3.6 - 11.0 K/uL   RBC 4.15 3.80 - 5.20 MIL/uL   Hemoglobin 12.5 12.0 - 16.0 g/dL   HCT 36.8 35.0 - 47.0 %   MCV 88.6 80.0 - 100.0 fL   MCH 30.1 26.0 - 34.0 pg   MCHC 34.0 32.0 - 36.0 g/dL   RDW 14.9 (H) 11.5 - 14.5 %   Platelets 151 150 - 440 K/uL  Troponin I     Status: None   Collection Time: 02/20/17  2:51 PM  Result Value Ref Range   Troponin I <0.03 <0.03 ng/mL  HIV antibody (Routine Testing)     Status: None   Collection Time: 02/20/17  8:49 PM  Result Value Ref Range   HIV Screen 4th Generation wRfx Non Reactive Non Reactive    Comment: (NOTE) Performed At: [facility] [address] [place], [place] [id number] Lindon Romp MD [id number]   Troponin I     Status: None  Collection Time: 02/20/17  8:49 PM  Result Value Ref Range   Troponin I <0.03 <0.03 ng/mL  Troponin I     Status: None   Collection Time: 02/21/17  1:55 AM  Result Value Ref Range   Troponin I <0.03 <0.03 ng/mL     PHQ2/9: Depression screen [facility] 2/9 11/24/2016 09/04/2016 05/23/2016 02/18/2016 11/05/2015  Decreased Interest 0 0 0 0 0  Down, Depressed, Hopeless 0 0 0 0 0  PHQ - 2 Score 0 0 0 0 0     Fall Risk: Fall Risk  11/24/2016 09/04/2016 05/23/2016 02/18/2016  11/05/2015  Falls in the past year? No No No No No     Assessment & Plan  1. Hospital discharge follow-up  Based on chart review it seems like symptoms were triggered by anxiety, she is still worried about going back to work, I will give her an excuse until Monday, but explained that she needs to follow up with her therapist and make sure she takes her medications daily to avoid panic attacks. We contacted pharmacy and she is 22 days late on getting Lexapro and Alprazolam rx, advised to take it daily and return to work Monday   2. Chest pain, unspecified type  Either GERD or secondary to panic attack, versus episode of SVT.   3. PSVT (paroxysmal supraventricular tachycardia) ([facility])  Continue Metoprolol and follow up with Dr. Fletcher Anon  4. Anxiety, generalized  Looked at the bottles of medication and looks like she has not been very compliant with medication, explained that controlling her anxiety will help her symptoms. Advised her to follow up with her therapist

## 2017-02-26 ENCOUNTER — Ambulatory Visit: Payer: 59 | Admitting: Family Medicine

## 2017-02-27 ENCOUNTER — Telehealth: Payer: Self-pay

## 2017-02-27 NOTE — Telephone Encounter (Signed)
Patient states she is feeling better since starting the acid reflux medication. She has a follow up on Friday to see Dr. Carlynn PurlSowles and has started back taking all of her medication daily. The hospital started her on Protonix 40 mg daily but kept on the same regimen as before. She does have a follow up with Dr. Kirke CorinArida.

## 2017-03-02 ENCOUNTER — Encounter: Payer: Self-pay | Admitting: Family Medicine

## 2017-03-02 ENCOUNTER — Encounter: Payer: Self-pay | Admitting: Cardiovascular Disease

## 2017-03-02 ENCOUNTER — Ambulatory Visit (INDEPENDENT_AMBULATORY_CARE_PROVIDER_SITE_OTHER): Payer: 59 | Admitting: Family Medicine

## 2017-03-02 ENCOUNTER — Ambulatory Visit (INDEPENDENT_AMBULATORY_CARE_PROVIDER_SITE_OTHER): Payer: 59 | Admitting: Cardiovascular Disease

## 2017-03-02 VITALS — BP 112/70 | HR 60 | Temp 98.1°F | Resp 14 | Ht 64.0 in | Wt 128.7 lb

## 2017-03-02 VITALS — BP 112/78 | HR 48 | Ht 64.0 in | Wt 128.2 lb

## 2017-03-02 DIAGNOSIS — G44221 Chronic tension-type headache, intractable: Secondary | ICD-10-CM | POA: Diagnosis not present

## 2017-03-02 DIAGNOSIS — E559 Vitamin D deficiency, unspecified: Secondary | ICD-10-CM | POA: Diagnosis not present

## 2017-03-02 DIAGNOSIS — I471 Supraventricular tachycardia: Secondary | ICD-10-CM

## 2017-03-02 DIAGNOSIS — F411 Generalized anxiety disorder: Secondary | ICD-10-CM

## 2017-03-02 DIAGNOSIS — K219 Gastro-esophageal reflux disease without esophagitis: Secondary | ICD-10-CM | POA: Diagnosis not present

## 2017-03-02 DIAGNOSIS — K581 Irritable bowel syndrome with constipation: Secondary | ICD-10-CM

## 2017-03-02 MED ORDER — BUTALBITAL-APAP-CAFF-COD 50-325-40-30 MG PO CAPS: 1.0000 | ORAL_CAPSULE | ORAL | 0 refills | Status: DC | PRN

## 2017-03-02 NOTE — Progress Notes (Signed)
Cardiology Office Note   Date:  03/02/2017   ID:  Chelsea Decker, DOB 11-20-1960, MRN [id number]  PCP:  Alba CorySowles, Krichna, MD  Cardiologist:   Lorine BearsMuhammad Naythan Douthit, MD   Chief Complaint  Patient presents with  . other    Pt went to ED due to chest pain c/o fatigue. Meds reviewed verbally with pt.      History of Present Illness: Chelsea Decker is a 56 y.o. female who presents for a followup visit regarding paroxysmal supraventricular tachycardia and recurrent chest pain. She was seen in 2013 for chest pain and palpitations. She underwent a treadmill stress test which showed no evidence of ischemia. However, at peak stress, she went into supraventricular tachycardia with a heart rate of 220 beats per minute which was brief and terminated spontaneously. She had a Holter monitor done which showed PVCs without other arrhythmia. Echocardiogram was normal.  Symptoms of tachycardia improved with metoprolol. She was also evaluated for an episode of syncope which was vasovagal in nature. Pharmacologic nuclear stress test was done in June of 2016 due to recurrent episodes of chest pain. It showed no evidence of ischemia with normal ejection fraction.   She was [facility] after she presented with chest pain and tachycardia with heart rate of 124 bpm on her watch. She was evaluated with CT scan which showed no evidence of pulmonary embolism. EKG was normal.  She has been doing well since then with no recurrent chest pain or shortness of breath. No tachycardia.   Past Medical History:  Diagnosis Date  . Anxiety   . Asthma   . External hemorrhoids without mention of complication   . GERD (gastroesophageal reflux disease)   . Heart palpitations   . IBS (irritable bowel syndrome)   . Lump or mass in breast   . Palpitations   . PSVT (paroxysmal supraventricular tachycardia) ([facility]) 04/12/2012  . Unspecified vitamin D deficiency     Past Surgical History:  Procedure  Laterality Date  . ABDOMINAL HYSTERECTOMY     2013  . BREAST BIOPSY Left 2013   core w/clip - neg  . LAPAROSCOPIC TOTAL HYSTERECTOMY       Current Outpatient Prescriptions  Medication Sig Dispense Refill  . acetaminophen (TYLENOL) 500 MG chewable tablet Chew 500 mg by mouth every 6 (six) hours as needed.    . ALPRAZolam (XANAX XR) 0.5 MG 24 hr tablet Take 1 tablet (0.5 mg total) by mouth daily. 30 tablet 2  . aspirin 81 MG tablet Take 81 mg by mouth as needed.     . butalbital-acetaminophen-caffeine (FIORICET WITH CODEINE) 50-325-40-30 MG capsule Take 1 capsule by mouth every 4 (four) hours as needed for headache. 30 capsule 0  . Cholecalciferol (VITAMIN D) 2000 UNITS tablet Take 2,000 Units by mouth daily.    Marland Kitchen. escitalopram (LEXAPRO) 10 MG tablet Take 1 tablet (10 mg total) by mouth daily. 30 tablet 2  . hyoscyamine (LEVSIN) 0.125 MG tablet Take 1 tablet (0.125 mg total) by mouth every 6 (six) hours as needed. 30 tablet 0  . linaclotide (LINZESS) 72 MCG capsule Take 1 capsule (72 mcg total) by mouth daily. (Patient taking differently: Take 72 mcg by mouth daily as needed. ) 30 capsule 2  . metoprolol tartrate (LOPRESSOR) 25 MG tablet Take 1 tablet (25 mg total) by mouth 2 (two) times daily. 60 tablet 1  . pantoprazole (PROTONIX) 40 MG tablet Take 1 tablet (40 mg total) by mouth daily. 30  tablet 1   No current facility-administered medications for this visit.     Allergies:   Patient has no known allergies.    Social History:  The patient  reports that she has never smoked. She has never used smokeless tobacco. She reports that she does not drink alcohol or use drugs.   Family History:  The patient's family history includes Cancer in her father and mother; Diabetes in her father and mother.    ROS:  Please see the history of present illness.   Otherwise, review of systems are positive for none.   All other systems are reviewed and negative.    PHYSICAL EXAM: VS:  BP 112/78 (BP  Location: Left Arm, Patient Position: Sitting, Cuff Size: Normal)   Pulse (!) 48   Ht 5\' 4"  (1.626 m)   Wt 128 lb 4 oz (58.2 kg)   BMI 22.01 kg/m  , BMI Body mass index is 22.01 kg/m. GEN: Well nourished, well developed, in no acute distress  HEENT: normal  Neck: no JVD, carotid bruits, or masses Cardiac: RRR; no murmurs, rubs, or gallops,no edema  Respiratory:  clear to auscultation bilaterally, normal work of breathing GI: soft, nontender, nondistended, + BS MS: no deformity or atrophy  Skin: warm and dry, no rash Neuro:  Strength and sensation are intact Psych: euthymic mood, full affect   EKG:  EKG is ordered today. The ekg ordered today demonstrates sinus bradycardia with nonspecific T wave changes.   Recent Labs: 11/27/2016: ALT 10 02/20/2017: BUN 15; Creatinine, Ser 0.83; Hemoglobin 12.5; Platelets 151; Potassium 3.9; Sodium 140    Lipid Panel    Component Value Date/Time   CHOL 189 11/27/2016 0819   CHOL 231 (H) 11/05/2015 1054   TRIG 49 11/27/2016 0819   HDL 65 11/27/2016 0819   HDL 80 11/05/2015 1054   CHOLHDL 2.9 11/27/2016 0819   VLDL 10 11/27/2016 0819   LDLCALC 114 (H) 11/27/2016 0819   LDLCALC 141 (H) 11/05/2015 1054      Wt Readings from Last 3 Encounters:  03/02/17 128 lb 4 oz (58.2 kg)  02/22/17 126 lb 1.6 oz (57.2 kg)  02/20/17 135 lb (61.2 kg)        ASSESSMENT AND PLAN:  1.  Paroxysmal supraventricular tachycardia:  I suspect that her recent episode was likely triggered by stress and anxiety. She is mildly bradycardic but I'm going to continue same dose of metoprolol 25 mg twice daily. She does complain of fatigue and we might need to consider switching her to diltiazem in the future if this continues to be an issue.  2. Chest pain with negative stress test twice in the past most recently in 2016. She has no convincing symptoms of angina.    Disposition:   FU with me in 6 months.   Signed,  Lorine Bears, MD  03/02/2017 12:03 PM      [facility]

## 2017-03-02 NOTE — Progress Notes (Signed)
Name: Chelsea Decker   MRN: [id number]    DOB: 16-Mar-1961   Date:03/02/2017       Progress Note  Subjective  Chief Complaint  Chief Complaint  Patient presents with  . Hospitalization Follow-up    1 week, has seen Dr. Fletcher Decker, Cardiologist  . Anxiety    HPI  Tension Headaches: she has episodes of headaches pain is described as a throbbing or sharp sensation, and radiates to right side of neck, sometimes on left side. No photophobia or phonophobia. Lasts about one hour. Down to one to two times weekly and she takes prn medication. She is no longer on Elavil and is doing well. Taking Fioricet prn, last rx written 08/2016 and needs refills today   IBS with constipation: symptoms have improved with Linzess, she still has episodes of diarrhea with lower dose of Linzess, but not as severe. Pain is still intermittent, but no longer has constipation. Taking Levsin prn. She denies blood or mucus in stools.   GERD: under control with medication, no heartburn or regurgitation at this time, denies side effects of medication. Discussed risk of c.difi colitis and Alzheimer's, she hadstopped the medication and was doing well however went to [facility] in July with chest pain and was re-started on pantoprazole, advised to try weaning self off again, since chest pain was likely from SVT and anxiety   SVT: seen by Dr. Fletcher Decker, stress test in 2013 showed HR went up to 220 with maximum activity, advised to avoid caffeine. She was doing well on metoprolol, however recent visit to [facility] with palpation and chest pain, negative cardiac enzymes and is back on all of her anxiety medication and metoprolol. Only side effect is mild fatigue and Dr. Fletcher Decker will consider switching to cardizem on her next visit in 6 months.   GAD: Chelsea Decker had to take some time off work back in September 2016 because of severe anxiety associated with her work. 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 She  still feels overwhelmed at work, but did better this past week. 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 She still feels like she has more work than her co-workers. Last episode of chest pain happened while at work and went to [facility] on July 10 th, at the time we called pharmacy and she was 20 days behind on Lexapro and Alprazolam fills. She has been taking medications daily since.    Patient Active Problem List   Diagnosis Date Noted  . Chest pain 02/20/2017  . Post-menopause on HRT (hormone replacement therapy) 11/05/2015  . Fasting hyperglycemia 08/10/2015  . Anxiety, generalized 03/20/2015  . Bradycardia 03/20/2015  . Chronic tension-type headache, intractable 03/20/2015  . Gastro-esophageal reflux disease without esophagitis 03/20/2015  . IBS (irritable bowel syndrome) 03/20/2015  . Menopause 03/20/2015  . Vitamin D deficiency 03/20/2015  . Vasovagal syncope 08/18/2014  . PSVT (paroxysmal supraventricular tachycardia) ([place]) 04/12/2012  . Lump or mass in breast 04/01/2009  . External hemorrhoids 07/15/2007    Past Surgical History:  Procedure Laterality Date  . ABDOMINAL HYSTERECTOMY     2013  . BREAST BIOPSY Left 2013   core w/clip - neg  . LAPAROSCOPIC TOTAL HYSTERECTOMY      Family History  Problem Relation Age of Onset  . Cancer Mother   . Diabetes Mother   . Cancer Father   . Diabetes Father     Social History   Social History  .  Marital status: Divorced    Spouse name: N/A  . Number of children: N/A  . Years of education: N/A   Occupational History  . Not on file.   Social History Main Topics  . Smoking status: Never Smoker  . Smokeless tobacco: Never Used  . Alcohol use No  . Drug use: No  . Sexual activity: No   Other Topics Concern  . Not on file   Social History Narrative  . No narrative on file     Current Outpatient Prescriptions:  .  acetaminophen (TYLENOL) 500 MG chewable tablet, Chew 500 mg by mouth every 6 (six)  hours as needed., Disp: , Rfl:  .  ALPRAZolam (XANAX XR) 0.5 MG 24 hr tablet, Take 1 tablet (0.5 mg total) by mouth daily., Disp: 30 tablet, Rfl: 2 .  aspirin 81 MG tablet, Take 81 mg by mouth as needed. , Disp: , Rfl:  .  butalbital-acetaminophen-caffeine (FIORICET WITH CODEINE) 50-325-40-30 MG capsule, Take 1 capsule by mouth every 4 (four) hours as needed for headache., Disp: 30 capsule, Rfl: 0 .  Cholecalciferol (VITAMIN D) 2000 UNITS tablet, Take 2,000 Units by mouth daily., Disp: , Rfl:  .  escitalopram (LEXAPRO) 10 MG tablet, Take 1 tablet (10 mg total) by mouth daily., Disp: 30 tablet, Rfl: 2 .  hyoscyamine (LEVSIN) 0.125 MG tablet, Take 1 tablet (0.125 mg total) by mouth every 6 (six) hours as needed., Disp: 30 tablet, Rfl: 0 .  linaclotide (LINZESS) 72 MCG capsule, Take 1 capsule (72 mcg total) by mouth daily. (Patient taking differently: Take 72 mcg by mouth daily as needed. ), Disp: 30 capsule, Rfl: 2 .  metoprolol tartrate (LOPRESSOR) 25 MG tablet, Take 1 tablet (25 mg total) by mouth 2 (two) times daily., Disp: 60 tablet, Rfl: 1 .  pantoprazole (PROTONIX) 40 MG tablet, Take 1 tablet (40 mg total) by mouth daily., Disp: 30 tablet, Rfl: 1  No Known Allergies   ROS  Ten systems reviewed and is negative except as mentioned in HPI   Objective  Vitals:   03/02/17 1304  BP: 112/70  Pulse: 60  Resp: 14  Temp: 98.1 F (36.7 C)  TempSrc: Oral  SpO2: 96%  Weight: 128 lb 11.2 oz (58.4 kg)  Height: _0  (1.626 m)    Body mass index is 22.09 kg/m.  Physical Exam  Constitutional: Patient appears well-developed and well-nourished.No distress.  HEENT: head atraumatic, normocephalic, pupils equal and reactive to light,  neck supple, throat within normal limits Cardiovascular: Normal rate, regular rhythm and normal heart sounds.  No murmur heard. No BLE edema. Pulmonary/Chest: Effort normal and breath sounds normal. No respiratory distress. Abdominal: Soft.  There is no  tenderness. Psychiatric: Patient has a normal mood and affect. behavior is normal. Judgment and thought content normal.  Recent Results (from the past 2160 hour(s))  Basic metabolic panel     Status: None   Collection Time: 02/20/17  2:51 PM  Result Value Ref Range   Sodium 140 135 - 145 mmol/L   Potassium 3.9 3.5 - 5.1 mmol/L   Chloride 105 101 - 111 mmol/L   CO2 29 22 - 32 mmol/L   Glucose, Bld 83 65 - 99 mg/dL   BUN 15 6 - 20 mg/dL   Creatinine, Ser 0.83 0.44 - 1.00 mg/dL   Calcium 10.0 8.9 - 10.3 mg/dL   GFR calc non Af Amer >60 >60 mL/min   GFR calc Af Amer >60 >60 mL/min    Comment: (  NOTE) The eGFR has been calculated using the CKD EPI equation. This calculation has not been validated in all clinical situations. eGFR's persistently <60 mL/min signify possible Chronic Kidney Disease.    Anion gap 6 5 - 15  CBC     Status: Abnormal   Collection Time: 02/20/17  2:51 PM  Result Value Ref Range   WBC 8.0 3.6 - 11.0 K/uL   RBC 4.15 3.80 - 5.20 MIL/uL   Hemoglobin 12.5 12.0 - 16.0 g/dL   HCT 36.8 35.0 - 47.0 %   MCV 88.6 80.0 - 100.0 fL   MCH 30.1 26.0 - 34.0 pg   MCHC 34.0 32.0 - 36.0 g/dL   RDW 14.9 (H) 11.5 - 14.5 %   Platelets 151 150 - 440 K/uL  Troponin I     Status: None   Collection Time: 02/20/17  2:51 PM  Result Value Ref Range   Troponin I <0.03 <0.03 ng/mL  HIV antibody (Routine Testing)     Status: None   Collection Time: 02/20/17  8:49 PM  Result Value Ref Range   HIV Screen 4th Generation wRfx Non Reactive Non Reactive    Comment: (NOTE) Performed At: [facility] [address] [place], [place] [id number] Lindon Romp MD WF:[id number]   Troponin I     Status: None   Collection Time: 02/20/17  8:49 PM  Result Value Ref Range   Troponin I <0.03 <0.03 ng/mL  Troponin I     Status: None   Collection Time: 02/21/17  1:55 AM  Result Value Ref Range   Troponin I <0.03 <0.03 ng/mL      PHQ2/9: Depression screen [facility] 2/9 03/02/2017  11/24/2016 09/04/2016 05/23/2016 02/18/2016  Decreased Interest 0 0 0 0 0  Down, Depressed, Hopeless 0 0 0 0 0  PHQ - 2 Score 0 0 0 0 0    Fall Risk: Fall Risk  03/02/2017 11/24/2016 09/04/2016 05/23/2016 02/18/2016  Falls in the past year? _0      Functional Status Survey: Is the patient deaf or have difficulty hearing?: No Does the patient have difficulty seeing, even when wearing glasses/contacts?: No Does the patient have difficulty concentrating, remembering, or making decisions?: No Does the patient have difficulty walking or climbing stairs?: No Does the patient have difficulty dressing or bathing?: No    Assessment & Plan  1. PSVT (paroxysmal supraventricular tachycardia) ([place])  Seen by Dr. Fletcher Decker today and advised to stay on Metoprolol   2. Anxiety, generalized  She is back on Lexapro and Alprazolam and is doing better, advised to go back to therapist also   3. Chronic tension-type headache, intractable  - butalbital-acetaminophen-caffeine (FIORICET WITH CODEINE) 50-325-40-30 MG capsule; Take 1 capsule by mouth every 4 (four) hours as needed for headache.  Dispense: 30 capsule; Refill: 0  4. Irritable bowel syndrome with constipation  Still has Linzess   5. Gastro-esophageal reflux disease without esophagitis  Taking pantoprazole, but advised to try weaning self off in the next couple of weeks  6. Vitamin D deficiency  Taking supplementation

## 2017-03-02 NOTE — Patient Instructions (Signed)
Medication Instructions: Continue same medications.   Labwork: None.   Procedures/Testing: None.   Follow-Up: 6 months with Dr. Zell Hylton.   Any Additional Special Instructions Will Be Listed Below (If Applicable).     If you need a refill on your cardiac medications before your next appointment, please call your pharmacy.   

## 2017-03-27 ENCOUNTER — Other Ambulatory Visit: Payer: Self-pay | Admitting: Family Medicine

## 2017-03-27 DIAGNOSIS — Z1231 Encounter for screening mammogram for malignant neoplasm of breast: Secondary | ICD-10-CM

## 2017-04-12 ENCOUNTER — Ambulatory Visit
Admission: RE | Admit: 2017-04-12 | Discharge: 2017-04-12 | Disposition: A | Discharge status: Home / Self Care | Payer: 59 | Source: Ambulatory Visit | Attending: Family Medicine | Admitting: Family Medicine

## 2017-04-12 DIAGNOSIS — Z1231 Encounter for screening mammogram for malignant neoplasm of breast: Secondary | ICD-10-CM | POA: Diagnosis not present

## 2017-04-17 ENCOUNTER — Other Ambulatory Visit: Payer: Self-pay | Admitting: Cardiovascular Disease

## 2017-04-17 ENCOUNTER — Encounter: Payer: Self-pay | Admitting: Family Medicine

## 2017-04-17 ENCOUNTER — Ambulatory Visit (INDEPENDENT_AMBULATORY_CARE_PROVIDER_SITE_OTHER): Payer: 59 | Admitting: Family Medicine

## 2017-04-17 ENCOUNTER — Other Ambulatory Visit: Payer: Self-pay | Admitting: Family Medicine

## 2017-04-17 VITALS — BP 108/68 | HR 60 | Temp 97.7°F | Resp 12 | Wt 129.7 lb

## 2017-04-17 DIAGNOSIS — L299 Pruritus, unspecified: Secondary | ICD-10-CM

## 2017-04-17 DIAGNOSIS — Z8619 Personal history of other infectious and parasitic diseases: Secondary | ICD-10-CM | POA: Insufficient documentation

## 2017-04-17 DIAGNOSIS — B029 Zoster without complications: Secondary | ICD-10-CM | POA: Diagnosis not present

## 2017-04-17 DIAGNOSIS — I471 Supraventricular tachycardia: Secondary | ICD-10-CM

## 2017-04-17 DIAGNOSIS — G44221 Chronic tension-type headache, intractable: Secondary | ICD-10-CM

## 2017-04-17 MED ORDER — HYDROXYZINE HCL 50 MG PO TABS: 25.0000 mg | ORAL_TABLET | Freq: Every evening | ORAL | 0 refills | Status: DC | PRN

## 2017-04-17 MED ORDER — PREDNISONE 10 MG PO TABS: 10.0000 mg | ORAL_TABLET | Freq: Two times a day (BID) | ORAL | 0 refills | Status: DC

## 2017-04-17 MED ORDER — NAPROXEN 250 MG PO TABS: 250.0000 mg | ORAL_TABLET | Freq: Two times a day (BID) | ORAL | 0 refills | Status: DC

## 2017-04-17 NOTE — Progress Notes (Addendum)
Name: Chelsea Decker   MRN: [id number]    DOB: 26-Sep-1960   Date:04/17/2017       Progress Note  Subjective  Chief Complaint  Chief Complaint  Patient presents with  . Rash    right upper back, onset last Thursday    HPI  Pt presents with rash that began 6 days ago as a very small bump that was very itchy and burning.  She applied some hydrocortisone cream and that helped a little bit but not much.  Pain has since extended from the RIGHT mid-upper back around to under her right breast, and area of erythema has enlarged.  No blisters, drainage or bleeding. Her fiance has been checking the site daily.  Pain is currently 8/10. Has used peroxide, alcohol. Used Aleve with good relief of pain.  Patient Active Problem List   Diagnosis Date Noted  . Chest pain 02/20/2017  . Post-menopause on HRT (hormone replacement therapy) 11/05/2015  . Fasting hyperglycemia 08/10/2015  . Anxiety, generalized 03/20/2015  . Bradycardia 03/20/2015  . Chronic tension-type headache, intractable 03/20/2015  . Gastro-esophageal reflux disease without esophagitis 03/20/2015  . IBS (irritable bowel syndrome) 03/20/2015  . Menopause 03/20/2015  . Vitamin D deficiency 03/20/2015  . Vasovagal syncope 08/18/2014  . PSVT (paroxysmal supraventricular tachycardia) ([place]) 04/12/2012  . Lump or mass in breast 04/01/2009  . External hemorrhoids 07/15/2007    Social History  Substance Use Topics  . Smoking status: Never Smoker  . Smokeless tobacco: Never Used  . Alcohol use No     Current Outpatient Prescriptions:  .  acetaminophen (TYLENOL) 500 MG chewable tablet, Chew 500 mg by mouth every 6 (six) hours as needed., Disp: , Rfl:  .  ALPRAZolam (XANAX XR) 0.5 MG 24 hr tablet, Take 1 tablet (0.5 mg total) by mouth daily., Disp: 30 tablet, Rfl: 2 .  aspirin 81 MG tablet, Take 81 mg by mouth as needed. , Disp: , Rfl:  .  butalbital-acetaminophen-caffeine (FIORICET WITH CODEINE) 50-325-40-30 MG capsule, Take 1  capsule by mouth every 4 (four) hours as needed for headache., Disp: 30 capsule, Rfl: 0 .  Cholecalciferol (VITAMIN D) 2000 UNITS tablet, Take 2,000 Units by mouth daily., Disp: , Rfl:  .  escitalopram (LEXAPRO) 10 MG tablet, Take 1 tablet (10 mg total) by mouth daily., Disp: 30 tablet, Rfl: 2 .  hyoscyamine (LEVSIN) 0.125 MG tablet, Take 1 tablet (0.125 mg total) by mouth every 6 (six) hours as needed., Disp: 30 tablet, Rfl: 0 .  linaclotide (LINZESS) 72 MCG capsule, Take 1 capsule (72 mcg total) by mouth daily. (Patient taking differently: Take 72 mcg by mouth daily as needed. ), Disp: 30 capsule, Rfl: 2 .  metoprolol tartrate (LOPRESSOR) 25 MG tablet, Take 1 tablet (25 mg total) by mouth 2 (two) times daily., Disp: 60 tablet, Rfl: 1 .  pantoprazole (PROTONIX) 40 MG tablet, Take 1 tablet (40 mg total) by mouth daily., Disp: 30 tablet, Rfl: 1  No Known Allergies  ROS  Constitutional: Negative for fever or weight change.  Respiratory: Negative for cough and shortness of breath.   Cardiovascular: Negative for chest pain or palpitations.  Gastrointestinal: Negative for abdominal pain, no bowel changes.  Musculoskeletal: Negative for gait problem or joint swelling.  Skin: See HPI. Neurological: Negative for dizziness or headache.  No other specific complaints in a complete review of systems (except as listed in HPI above).  Objective  Vitals:   04/17/17 1123  BP: 108/68  Pulse: 60  Resp:  12  Temp: 97.7 F (36.5 C)  TempSrc: Oral  SpO2: 96%  Weight: 129 lb 11.2 oz (58.8 kg)   Body mass index is 22.26 kg/m.  Nursing Note and Vital Signs reviewed.  Physical Exam  Constitutional: Patient appears well-developed and well-nourished. No distress.  HEENT: head atraumatic, normocephalic Cardiovascular: Normal rate, regular rhythm, S1/S2 present.  No murmur or rub heard. No BLE edema. Pulmonary/Chest: Effort normal and breath sounds clear. No respiratory distress or  retractions. Psychiatric: Patient has a normal mood and affect. behavior is normal. Judgment and thought content normal. Skin: 6cm x4cm oblong confluent erythematous patch with several raised papules present to right upper-mid back. Area is non-tender, no bulla are present, no bleeding/drainage, no excoriation.  No rash under right breast or on upper right flank - rash is localized to the area described above.  Recent Results (from the past 2160 hour(s))  Basic metabolic panel     Status: None   Collection Time: 02/20/17  2:51 PM  Result Value Ref Range   Sodium 140 135 - 145 mmol/L   Potassium 3.9 3.5 - 5.1 mmol/L   Chloride 105 101 - 111 mmol/L   CO2 29 22 - 32 mmol/L   Glucose, Bld 83 65 - 99 mg/dL   BUN 15 6 - 20 mg/dL   Creatinine, Ser 0.83 0.44 - 1.00 mg/dL   Calcium 10.0 8.9 - 10.3 mg/dL   GFR calc non Af Amer >60 >60 mL/min   GFR calc Af Amer >60 >60 mL/min    Comment: (NOTE) The eGFR has been calculated using the CKD EPI equation. This calculation has not been validated in all clinical situations. eGFR's persistently <60 mL/min signify possible Chronic Kidney Disease.    Anion gap 6 5 - 15  CBC     Status: Abnormal   Collection Time: 02/20/17  2:51 PM  Result Value Ref Range   WBC 8.0 3.6 - 11.0 K/uL   RBC 4.15 3.80 - 5.20 MIL/uL   Hemoglobin 12.5 12.0 - 16.0 g/dL   HCT 36.8 35.0 - 47.0 %   MCV 88.6 80.0 - 100.0 fL   MCH 30.1 26.0 - 34.0 pg   MCHC 34.0 32.0 - 36.0 g/dL   RDW 14.9 (H) 11.5 - 14.5 %   Platelets 151 150 - 440 K/uL  Troponin I     Status: None   Collection Time: 02/20/17  2:51 PM  Result Value Ref Range   Troponin I <0.03 <0.03 ng/mL  HIV antibody (Routine Testing)     Status: None   Collection Time: 02/20/17  8:49 PM  Result Value Ref Range   HIV Screen 4th Generation wRfx Non Reactive Non Reactive    Comment: (NOTE) Performed At: [facility] [address] [place], [place] [id number] Lindon Romp MD WU:[id number]   Troponin I      Status: None   Collection Time: 02/20/17  8:49 PM  Result Value Ref Range   Troponin I <0.03 <0.03 ng/mL  Troponin I     Status: None   Collection Time: 02/21/17  1:55 AM  Result Value Ref Range   Troponin I <0.03 <0.03 ng/mL     Assessment & Plan  1. Herpes zoster without complication - naproxen (NAPROSYN) 250 MG tablet; Take 1 tablet (250 mg total) by mouth 2 (two) times daily with a meal.  Dispense: 15 tablet; Refill: 0 - predniSONE (DELTASONE) 10 MG tablet; Take 1 tablet (10 mg total) by mouth 2 (two) times  daily with a meal.  Dispense: 10 tablet; Refill: 0  2. Itching - hydrOXYzine (ATARAX/VISTARIL) 50 MG tablet; Take 0.5-1 tablets (25-50 mg total) by mouth at bedtime as needed for itching.  Dispense: 15 tablet; Refill: 0 (Patient takes Xanax each morning, and she is aware of the possible sedative effects of hydroxyzine - she is in agreement to only take hydroxyzine at night to help reduce itching.) - predniSONE (DELTASONE) 10 MG tablet; Take 1 tablet (10 mg total) by mouth 2 (two) times daily with a meal.  Dispense: 10 tablet; Refill: 0  - Advised that because the rash is not spreading and it has been >72 hours, we will not use antiviral medications. Area does not appear infected, so antibiotics are not warranted.  Her fiance will check area once daily to monitor for signs and symptoms of infection including red streaking, increased redness, increased pain, purulent drainage. - Once healed and feeling better, patient will call her insurance company to determine if shingrix is covered at her pharmacy or at our office and will call to schedule if covered at our office. -Red flags and when to present for emergency care or RTC including fever >101.66F, signs and symptoms of infection, new/worsening/un-resolving symptoms reviewed with patient at time of visit. Follow up and care instructions discussed and provided in AVS.  I have reviewed this encounter including the documentation in this  note and/or discussed this patient with the Johney Maine, FNP, NP-C. I am certifying that I agree with the content of this note as supervising physician.  Steele Sizer, MD [facility] 04/17/2017, 1:18 PM

## 2017-04-17 NOTE — Patient Instructions (Addendum)

## 2017-04-18 ENCOUNTER — Encounter: Payer: Self-pay | Admitting: Family Medicine

## 2017-04-18 ENCOUNTER — Telehealth: Payer: Self-pay | Admitting: Family Medicine

## 2017-04-18 NOTE — Telephone Encounter (Signed)
Work note signed and available at front desk, pt aware that note is ready and will be by tomorrow to pick it up.

## 2017-04-18 NOTE — Telephone Encounter (Signed)
Patient notified will come tomorrow to pick up.

## 2017-04-18 NOTE — Telephone Encounter (Signed)
PT SAID THAT SHE DID NED THE NOTE FOR WORK AND THAT IS TO RETURN BACK TO WORK ON Monday.

## 2017-04-18 NOTE — Telephone Encounter (Signed)
Please give pt a call. Her job would not allow her to work [phone]

## 2017-04-18 NOTE — Telephone Encounter (Signed)
LMOM to call back to discuss work.  If patient calls back, please advise that we may provide a work note for her to return on Monday.  Please draft this so that I may sign and have it ready at the front desk. Thanks!

## 2017-06-04 ENCOUNTER — Ambulatory Visit (INDEPENDENT_AMBULATORY_CARE_PROVIDER_SITE_OTHER): Payer: 59 | Admitting: Family Medicine

## 2017-06-04 ENCOUNTER — Encounter: Payer: Self-pay | Admitting: Family Medicine

## 2017-06-04 VITALS — BP 90/50 | HR 62 | Resp 14 | Ht 64.0 in | Wt 133.3 lb

## 2017-06-04 DIAGNOSIS — R7301 Impaired fasting glucose: Secondary | ICD-10-CM | POA: Diagnosis not present

## 2017-06-04 DIAGNOSIS — K219 Gastro-esophageal reflux disease without esophagitis: Secondary | ICD-10-CM

## 2017-06-04 DIAGNOSIS — Z8619 Personal history of other infectious and parasitic diseases: Secondary | ICD-10-CM

## 2017-06-04 DIAGNOSIS — R7303 Prediabetes: Secondary | ICD-10-CM

## 2017-06-04 DIAGNOSIS — Z23 Encounter for immunization: Secondary | ICD-10-CM | POA: Diagnosis not present

## 2017-06-04 DIAGNOSIS — F411 Generalized anxiety disorder: Secondary | ICD-10-CM | POA: Diagnosis not present

## 2017-06-04 DIAGNOSIS — I471 Supraventricular tachycardia, unspecified: Secondary | ICD-10-CM

## 2017-06-04 DIAGNOSIS — F410 Panic disorder [episodic paroxysmal anxiety] without agoraphobia: Secondary | ICD-10-CM

## 2017-06-04 DIAGNOSIS — G44221 Chronic tension-type headache, intractable: Secondary | ICD-10-CM | POA: Diagnosis not present

## 2017-06-04 DIAGNOSIS — K581 Irritable bowel syndrome with constipation: Secondary | ICD-10-CM

## 2017-06-04 MED ORDER — ALPRAZOLAM ER 0.5 MG PO TB24: 0.5000 mg | ORAL_TABLET | Freq: Every day | ORAL | 2 refills | Status: DC

## 2017-06-04 MED ORDER — ESCITALOPRAM OXALATE 10 MG PO TABS: 10.0000 mg | ORAL_TABLET | Freq: Every day | ORAL | 2 refills | Status: DC

## 2017-06-04 NOTE — Progress Notes (Signed)
Name: Chelsea Decker   MRN: [id number]    DOB: Apr 20, 1961   Date:06/04/2017       Progress Note  Subjective  Chief Complaint  Chief Complaint  Patient presents with  . Anxiety  . Gastroesophageal Reflux    HPI  Tension Headaches: she has episodes of headaches pain is described as a throbbing or sharp sensation, and radiates to right side of neck, sometimes on left side. No photophobia or phonophobia. Lasts about one hour. Episodes are down to at most twice a week, takes Fioricet prn and works within 10-15 minutes.   IBS with constipation: symptoms have improved with Linzess, she is on lower dose, bowel movements daily now, occasionally twice per day, no blood in stools or mucus. Pain is still intermittent, but no longer has constipation.   GERD: she has been out of Pantoprazole and denies heart burn or indigestion. We will monitor for now  SVT: seen by Dr. Kirke CorinArida, stress test in 2013 showed HR went up to 220 with maximum activity, advised to avoid caffeine.She is on Metoprolol and is doing well again, [facility] was secondary to panic attack causing chest pain.  GAD: Chelsea Decker had to take some time off work back in September 2016 because of severe anxiety associated with her work. 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 She still feels overwhelmed at work, but did better this past week. 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 She still feels like she has more work than her co-workers. Last episode of chest pain happened while at work and went to [facility] July 10 [facility], she states she is taking Lexapro and Alprazolam XR , GAD was 10 , she is still anxious, mostly work related. We will monitor for now, discussed therapy again   Patient Active Problem List   Diagnosis Date Noted  . Post-menopause on HRT (hormone replacement therapy) 11/05/2015  . Fasting hyperglycemia 08/10/2015  . Anxiety,  generalized 03/20/2015  . Bradycardia 03/20/2015  . Chronic tension-type headache, intractable 03/20/2015  . Gastro-esophageal reflux disease without esophagitis 03/20/2015  . IBS (irritable bowel syndrome) 03/20/2015  . Menopause 03/20/2015  . Vitamin D deficiency 03/20/2015  . Vasovagal syncope 08/18/2014  . PSVT (paroxysmal supraventricular tachycardia) (HCC) 04/12/2012  . External hemorrhoids 07/15/2007    Past Surgical History:  Procedure Laterality Date  . ABDOMINAL HYSTERECTOMY     2013  . BREAST BIOPSY Left 2013   core w/clip - neg  . LAPAROSCOPIC TOTAL HYSTERECTOMY      Family History  Problem Relation Age of Onset  . Cancer Mother   . Diabetes Mother   . Cancer Father   . Diabetes Father   . Breast cancer Neg Hx     Social History   Social History  . Marital status: Divorced    Spouse name: N/A  . Number of children: N/A  . Years of education: N/A   Occupational History  . Not on file.   Social History Main Topics  . Smoking status: Never Smoker  . Smokeless tobacco: Never Used  . Alcohol use No  . Drug use: No  . Sexual activity: Yes   Other Topics Concern  . Not on file   Social History Narrative  . No narrative on file     Current Outpatient Prescriptions:  .  acetaminophen (TYLENOL) 500 MG chewable tablet, Chew 500 mg by mouth every  6 (six) hours as needed., Disp: , Rfl:  .  ALPRAZolam (XANAX XR) 0.5 MG 24 hr tablet, Take 1 tablet (0.5 mg total) by mouth daily., Disp: 30 tablet, Rfl: 2 .  aspirin 81 MG tablet, Take 81 mg by mouth as needed. , Disp: , Rfl:  .  butalbital-acetaminophen-caffeine (FIORICET WITH CODEINE) 50-325-40-30 MG capsule, TAKE ONE CAPSULE BY MOUTH EVERY 4 HOURS AS NEEDED FOR HEADACHE, Disp: 30 capsule, Rfl: 0 .  Cholecalciferol (VITAMIN D) 2000 UNITS tablet, Take 2,000 Units by mouth daily., Disp: , Rfl:  .  escitalopram (LEXAPRO) 10 MG tablet, Take 1 tablet (10 mg total) by mouth daily., Disp: 30 tablet, Rfl: 2 .   linaclotide (LINZESS) 72 MCG capsule, Take 1 capsule (72 mcg total) by mouth daily. (Patient taking differently: Take 72 mcg by mouth daily as needed. ), Disp: 30 capsule, Rfl: 2 .  metoprolol tartrate (LOPRESSOR) 25 MG tablet, TAKE 1 TABLET (25 MG TOTAL) BY MOUTH 2 (TWO) TIMES DAILY., Disp: 60 tablet, Rfl: 3  No Known Allergies   ROS  Constitutional: Negative for fever or weight change.  Respiratory: Negative for cough and shortness of breath.   Cardiovascular: Negative for chest pain, positive for intermittent palpitations.  Gastrointestinal: Positive  For intermittent  abdominal pain, no bowel changes.  Musculoskeletal: Negative for gait problem or joint swelling.  Skin: Negative for rash.  Neurological: Negative for dizziness, positive for intermittent  headache.  No other specific complaints in a complete review of systems (except as listed in HPI above).  Objective  Vitals:   06/04/17 1337  BP: (!) 90/50  Pulse: 62  Resp: 14  SpO2: 98%  Weight: 133 lb 4.8 oz (60.5 kg)  Height: 5\' 4"  (1.626 m)    Body mass index is 22.88 kg/m.  Physical Exam  Constitutional: Patient appears well-developed and well-nourished. No distress.  HEENT: head atraumatic, normocephalic, pupils equal and reactive to light, neck supple, throat within normal limits Cardiovascular: Normal rate, regular rhythm and normal heart sounds.  No murmur heard. No BLE edema. Pulmonary/Chest: Effort normal and breath sounds normal. No respiratory distress. Abdominal: Soft.  There is no tenderness. Psychiatric: Patient has a normal mood and affect. behavior is normal. Judgment and thought content normal.  PHQ2/9: Depression screen [facility] 2/9 04/17/2017 03/02/2017 11/24/2016 09/04/2016 05/23/2016  Decreased Interest 0 0 0 0 0  Down, Depressed, Hopeless 0 0 0 0 0  PHQ - 2 Score 0 0 0 0 0     Fall Risk: Fall Risk  06/04/2017 04/17/2017 03/02/2017 11/24/2016 09/04/2016  Falls in the past year? No No No No No   GAD 7 :  Generalized Anxiety Score 06/04/2017  Nervous, Anxious, on Edge 2  Control/stop worrying 2  Worry too much - different things 2  Trouble relaxing 1  Restless 1  Easily annoyed or irritable 2  Afraid - awful might happen 0  Total GAD 7 Score 10      Functional Status Survey: Is the patient deaf or have difficulty hearing?: No Does the patient have difficulty seeing, even when wearing glasses/contacts?: No Does the patient have difficulty concentrating, remembering, or making decisions?: No Does the patient have difficulty walking or climbing stairs?: No Does the patient have difficulty dressing or bathing?: No Does the patient have difficulty doing errands alone such as visiting a doctor's office or shopping?: No    Assessment & Plan  1. Anxiety, generalized  - escitalopram (LEXAPRO) 10 MG tablet; Take 1 tablet (10 mg total)  by mouth daily.  Dispense: 30 tablet; Refill: 2 - ALPRAZolam (XANAX XR) 0.5 MG 24 hr tablet; Take 1 tablet (0.5 mg total) by mouth daily.  Dispense: 30 tablet; Refill: 2  2. Panic attack  - escitalopram (LEXAPRO) 10 MG tablet; Take 1 tablet (10 mg total) by mouth daily.  Dispense: 30 tablet; Refill: 2  3. Need for 23-polyvalent pneumococcal polysaccharide vaccine  - Pneumococcal polysaccharide vaccine 23-valent greater than or equal to 2yo subcutaneous/IM  4. Irritable bowel syndrome with constipation  Stable, has occasional sharp pain  5. Fasting hyperglycemia  She does not have DM, just pre-diabetes  6. Chronic tension-type headache, intractable  Continue prn medication  7. PSVT (paroxysmal supraventricular tachycardia) (HCC)  Continue metoprolol and follow up with Dr. Kirke Corin  8. Gastro-esophageal reflux disease without esophagitis  Off Pantoprazole and not having symptoms we will monitor  9. Pre-diabetes  - POCT UA - Microalbumin

## 2017-06-07 DIAGNOSIS — K581 Irritable bowel syndrome with constipation: Secondary | ICD-10-CM | POA: Diagnosis not present

## 2017-06-07 DIAGNOSIS — K219 Gastro-esophageal reflux disease without esophagitis: Secondary | ICD-10-CM | POA: Diagnosis not present

## 2017-06-07 DIAGNOSIS — G44221 Chronic tension-type headache, intractable: Secondary | ICD-10-CM | POA: Diagnosis not present

## 2017-06-07 DIAGNOSIS — R7303 Prediabetes: Secondary | ICD-10-CM | POA: Diagnosis not present

## 2017-06-07 DIAGNOSIS — F410 Panic disorder [episodic paroxysmal anxiety] without agoraphobia: Secondary | ICD-10-CM | POA: Diagnosis not present

## 2017-06-07 DIAGNOSIS — F411 Generalized anxiety disorder: Secondary | ICD-10-CM | POA: Diagnosis not present

## 2017-06-07 DIAGNOSIS — I471 Supraventricular tachycardia: Secondary | ICD-10-CM | POA: Diagnosis not present

## 2017-06-07 DIAGNOSIS — Z23 Encounter for immunization: Secondary | ICD-10-CM | POA: Diagnosis not present

## 2017-06-07 DIAGNOSIS — R7301 Impaired fasting glucose: Secondary | ICD-10-CM | POA: Diagnosis not present

## 2017-06-07 LAB — POCT UA - MICROALBUMIN
Albumin/Creatinine Ratio, Urine, POC: NEGATIVE
CREATININE, POC: NEGATIVE mg/dL
Microalbumin Ur, POC: NEGATIVE mg/L

## 2017-07-23 ENCOUNTER — Other Ambulatory Visit: Payer: Self-pay | Admitting: Family Medicine

## 2017-07-23 DIAGNOSIS — F411 Generalized anxiety disorder: Secondary | ICD-10-CM

## 2017-09-04 ENCOUNTER — Ambulatory Visit (INDEPENDENT_AMBULATORY_CARE_PROVIDER_SITE_OTHER): Payer: 59 | Admitting: Cardiovascular Disease

## 2017-09-04 ENCOUNTER — Ambulatory Visit (INDEPENDENT_AMBULATORY_CARE_PROVIDER_SITE_OTHER): Payer: 59 | Admitting: Family Medicine

## 2017-09-04 ENCOUNTER — Encounter: Payer: Self-pay | Admitting: Cardiovascular Disease

## 2017-09-04 ENCOUNTER — Encounter: Payer: Self-pay | Admitting: Family Medicine

## 2017-09-04 VITALS — BP 120/74 | HR 70 | Temp 97.8°F | Resp 16 | Ht 64.0 in | Wt 133.6 lb

## 2017-09-04 VITALS — BP 102/70 | HR 57 | Ht 64.0 in | Wt 133.2 lb

## 2017-09-04 DIAGNOSIS — G44221 Chronic tension-type headache, intractable: Secondary | ICD-10-CM

## 2017-09-04 DIAGNOSIS — F411 Generalized anxiety disorder: Secondary | ICD-10-CM | POA: Diagnosis not present

## 2017-09-04 DIAGNOSIS — I471 Supraventricular tachycardia: Secondary | ICD-10-CM | POA: Diagnosis not present

## 2017-09-04 DIAGNOSIS — F410 Panic disorder [episodic paroxysmal anxiety] without agoraphobia: Secondary | ICD-10-CM

## 2017-09-04 DIAGNOSIS — K219 Gastro-esophageal reflux disease without esophagitis: Secondary | ICD-10-CM | POA: Diagnosis not present

## 2017-09-04 DIAGNOSIS — K581 Irritable bowel syndrome with constipation: Secondary | ICD-10-CM | POA: Diagnosis not present

## 2017-09-04 DIAGNOSIS — F320 Major depressive disorder, single episode, mild: Secondary | ICD-10-CM

## 2017-09-04 MED ORDER — ESCITALOPRAM OXALATE 10 MG PO TABS: 10.0000 mg | ORAL_TABLET | Freq: Every day | ORAL | 2 refills | Status: DC

## 2017-09-04 MED ORDER — ALPRAZOLAM ER 0.5 MG PO TB24: 0.5000 mg | ORAL_TABLET | Freq: Every day | ORAL | 2 refills | Status: DC

## 2017-09-04 NOTE — Patient Instructions (Signed)
Medication Instructions: Continue same medications.   Labwork: None.   Procedures/Testing: None.   Follow-Up: 1 year with Dr. Rally Ouch.   Any Additional Special Instructions Will Be Listed Below (If Applicable).     If you need a refill on your cardiac medications before your next appointment, please call your pharmacy.   

## 2017-09-04 NOTE — Progress Notes (Signed)
Cardiology Office Note   Date:  09/04/2017   ID:  Chelsea LotVanessa M Neubauer, DOB [birth year], MRN [id number]  PCP:  Alba CorySowles, Krichna, MD  Cardiologist:   Lorine BearsMuhammad Arida, MD   Chief Complaint  Patient presents with  . other    6 month f/u c/o elevated HR while using fitbit. Meds reviewed verbally with pt.      History of Present Illness: Chelsea Decker is a [age] y.o. female who presents for a followup visit regarding paroxysmal supraventricular tachycardia and recurrent chest pain. She was seen in 2013 for chest pain and palpitations. She underwent a treadmill stress test which showed no evidence of ischemia. However, at peak stress, she went into supraventricular tachycardia with a heart rate of 220 beats per minute which was brief and terminated spontaneously. She had a Holter monitor done which showed PVCs without other arrhythmia. Echocardiogram was normal.  Symptoms of tachycardia improved with metoprolol. She was also evaluated for an episode of syncope which was vasovagal in nature. Pharmacologic nuclear stress test was done in June of 2016 due to recurrent episodes of chest pain. It showed no evidence of ischemia with normal ejection fraction.   She purchased a fitbit watch for Christmas and noticed few episodes of tachycardia.  On one occasion, heart rate was 150 bpm at rest.  However, she did not have palpitations and did not have any symptoms during that time. We tested her watch today and the recorded heart rate was 115-120 bpm.  However, her pulse was less than 60 by exam and by EKG.  Past Medical History:  Diagnosis Date  . Anxiety   . Asthma   . External hemorrhoids without mention of complication   . GERD (gastroesophageal reflux disease)   . Heart palpitations   . IBS (irritable bowel syndrome)   . Lump or mass in breast   . Palpitations   . PSVT (paroxysmal supraventricular tachycardia) (HCC) 04/12/2012  . Unspecified vitamin D deficiency     Past Surgical History:   Procedure Laterality Date  . ABDOMINAL HYSTERECTOMY     2013  . BREAST BIOPSY Left 2013   core w/clip - neg  . LAPAROSCOPIC TOTAL HYSTERECTOMY       Current Outpatient Medications  Medication Sig Dispense Refill  . acetaminophen (TYLENOL) 500 MG chewable tablet Chew 500 mg by mouth every 6 (six) hours as needed.    . ALPRAZolam (XANAX XR) 0.5 MG 24 hr tablet Take 1 tablet (0.5 mg total) by mouth daily. 30 tablet 2  . aspirin 81 MG tablet Take 81 mg by mouth as needed.     . butalbital-acetaminophen-caffeine (FIORICET WITH CODEINE) 50-325-40-30 MG capsule TAKE ONE CAPSULE BY MOUTH EVERY 4 HOURS AS NEEDED FOR HEADACHE 30 capsule 0  . Cholecalciferol (VITAMIN D) 2000 UNITS tablet Take 2,000 Units by mouth daily.    Marland Kitchen. escitalopram (LEXAPRO) 10 MG tablet Take 1 tablet (10 mg total) by mouth daily. 30 tablet 2  . linaclotide (LINZESS) 72 MCG capsule Take 1 capsule (72 mcg total) by mouth daily. (Patient taking differently: Take 72 mcg by mouth daily as needed. ) 30 capsule 2  . metoprolol tartrate (LOPRESSOR) 25 MG tablet TAKE 1 TABLET (25 MG TOTAL) BY MOUTH 2 (TWO) TIMES DAILY. 60 tablet 3   No current facility-administered medications for this visit.     Allergies:   Patient has no known allergies.    Social History:  The patient  reports that  has never smoked. she  has never used smokeless tobacco. She reports that she does not drink alcohol or use drugs.   Family History:  The patient's family history includes Cancer in her father and mother; Diabetes in her father and mother.    ROS:  Please see the history of present illness.   Otherwise, review of systems are positive for none.   All other systems are reviewed and negative.    PHYSICAL EXAM: VS:  BP 102/70 (BP Location: Left Arm, Patient Position: Sitting, Cuff Size: Normal)   Pulse (!) 57   Ht 5\' 4"  (1.626 m)   Wt 133 lb 4 oz (60.4 kg)   BMI 22.87 kg/m  , BMI Body mass index is 22.87 kg/m. GEN: Well nourished, well  developed, in no acute distress  HEENT: normal  Neck: no JVD, carotid bruits, or masses Cardiac: RRR; no murmurs, rubs, or gallops,no edema  Respiratory:  clear to auscultation bilaterally, normal work of breathing GI: soft, nontender, nondistended, + BS MS: no deformity or atrophy  Skin: warm and dry, no rash Neuro:  Strength and sensation are intact Psych: euthymic mood, full affect   EKG:  EKG is ordered today. The ekg ordered today demonstrates sinus bradycardia with nonspecific T wave changes.   Recent Labs: 11/27/2016: ALT 10 02/20/2017: BUN 15; Creatinine, Ser 0.83; Hemoglobin 12.5; Platelets 151; Potassium 3.9; Sodium 140    Lipid Panel    Component Value Date/Time   CHOL 189 11/27/2016 0819   CHOL 231 (H) 11/05/2015 1054   TRIG 49 11/27/2016 0819   HDL 65 11/27/2016 0819   HDL 80 11/05/2015 1054   CHOLHDL 2.9 11/27/2016 0819   VLDL 10 11/27/2016 0819   LDLCALC 114 (H) 11/27/2016 0819   LDLCALC 141 (H) 11/05/2015 1054      Wt Readings from Last 3 Encounters:  09/04/17 133 lb 4 oz (60.4 kg)  09/04/17 133 lb 9.6 oz (60.6 kg)  06/04/17 133 lb 4.8 oz (60.5 kg)        ASSESSMENT AND PLAN:  1.  Paroxysmal supraventricular tachycardia: Clinically, she is stable on current dose of metoprolol with no documented recurrent episodes.  Fitbit watch does not seem to be correlating accurately with her pulse.  I recommend continuing current treatment with metoprolol.  2. Chest pain with negative stress test twice in the past most recently in 2016. She has no convincing symptoms of angina.    Disposition:   FU with me in 12 months.   Signed,  Lorine Bears, MD  09/04/2017 3:38 PM    [facility]

## 2017-09-04 NOTE — Progress Notes (Signed)
Name: Chelsea Decker   MRN: [id number]    DOB: 01-Jun-1961   Date:09/04/2017       Progress Note  Subjective  Chief Complaint  Chief Complaint  Patient presents with  . Medication Refill    3 month F/U  . Anxiety  . Gastroesophageal Reflux  . Irritable Bowel Syndrome  . Tension headaches    She had to get new glasses and help from her straining her eyes    HPI  Tension Headaches: she has episodes of headaches pain is described as a throbbing or sharp sensation, and radiates to right side of neck, sometimes on left side. No photophobia or phonophobia. Lasts about one hour. Episodes are down to at most twice a week, takes Fioricet prn and works within 10-15 minutes. She still has medication at home  IBS with constipation: symptoms have improved with Linzess, she is on lower dose, bowel movements daily now, occasionally twice per day, no blood in stools or mucus. Pain is still intermittent, but no longer has constipation.   GERD: she has been out of Pantoprazole and denies heart burn or indigestion. We will monitor for now  SVT: seen by Dr. Kirke CorinArida, stress test in 2013 showed HR went up to 220 with maximum activity, advised to avoid caffeine.She is on Metoprolol . [facility] and it was secondary to panic attack causing chest pain. She states she has a fit bit and while at rest and had palpitation, she  looked down on her fitbit and HR was 150, it lasted about 10 minutes and resolved by itself. She states she was not stressed during that episode  GAD: Chelsea NoeVanessa had to take some time off work back in September 2016 because of severe anxiety associated with her work. 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 She still feels like she has more work than her co-workers.  She states she is taking Lexapro and Alprazolam XR  , she is still anxious, mostly  work related.    Patient Active Problem List   Diagnosis Date Noted  . History of shingles 04/17/2017  . Post-menopause on HRT (hormone replacement therapy) 11/05/2015  . Fasting hyperglycemia 08/10/2015  . Anxiety, generalized 03/20/2015  . Bradycardia 03/20/2015  . Chronic tension-type headache, intractable 03/20/2015  . Gastro-esophageal reflux disease without esophagitis 03/20/2015  . IBS (irritable bowel syndrome) 03/20/2015  . Menopause 03/20/2015  . Vitamin D deficiency 03/20/2015  . Vasovagal syncope 08/18/2014  . PSVT (paroxysmal supraventricular tachycardia) (HCC) 04/12/2012  . External hemorrhoids 07/15/2007    Past Surgical History:  Procedure Laterality Date  . ABDOMINAL HYSTERECTOMY     2013  . BREAST BIOPSY Left 2013   core w/clip - neg  . LAPAROSCOPIC TOTAL HYSTERECTOMY      Family History  Problem Relation Age of Onset  . Cancer Mother   . Diabetes Mother   . Cancer Father   . Diabetes Father   . Breast cancer Neg Hx     Social History   Socioeconomic History  . Marital status: Divorced    Spouse name: Not on file  . Number of children: Not on file  . Years of education: Not on file  . Highest education level: Not on file  Social Needs  . Financial resource strain: Not on file  . Food insecurity - worry:  Not on file  . Food insecurity - inability: Not on file  . Transportation needs - medical: Not on file  . Transportation needs - non-medical: Not on file  Occupational History  . Not on file  Tobacco Use  . Smoking status: Never Smoker  . Smokeless tobacco: Never Used  Substance and Sexual Activity  . Alcohol use: No    Alcohol/week: 0.0 oz  . Drug use: No  . Sexual activity: Yes  Other Topics Concern  . Not on file  Social History Narrative  . Not on file     Current Outpatient Medications:  .  acetaminophen (TYLENOL) 500 MG chewable tablet, Chew 500 mg by mouth every 6 (six) hours as needed., Disp: , Rfl:  .  ALPRAZolam (XANAX  XR) 0.5 MG 24 hr tablet, Take 1 tablet (0.5 mg total) by mouth daily., Disp: 30 tablet, Rfl: 2 .  aspirin 81 MG tablet, Take 81 mg by mouth as needed. , Disp: , Rfl:  .  butalbital-acetaminophen-caffeine (FIORICET WITH CODEINE) 50-325-40-30 MG capsule, TAKE ONE CAPSULE BY MOUTH EVERY 4 HOURS AS NEEDED FOR HEADACHE, Disp: 30 capsule, Rfl: 0 .  Cholecalciferol (VITAMIN D) 2000 UNITS tablet, Take 2,000 Units by mouth daily., Disp: , Rfl:  .  escitalopram (LEXAPRO) 10 MG tablet, Take 1 tablet (10 mg total) by mouth daily., Disp: 30 tablet, Rfl: 2 .  linaclotide (LINZESS) 72 MCG capsule, Take 1 capsule (72 mcg total) by mouth daily. (Patient taking differently: Take 72 mcg by mouth daily as needed. ), Disp: 30 capsule, Rfl: 2 .  metoprolol tartrate (LOPRESSOR) 25 MG tablet, TAKE 1 TABLET (25 MG TOTAL) BY MOUTH 2 (TWO) TIMES DAILY., Disp: 60 tablet, Rfl: 3  No Known Allergies   ROS  Constitutional: Negative for fever or weight change.  Respiratory: Negative for cough and shortness of breath.   Cardiovascular: Positive for chest pain and  palpitations.  Gastrointestinal: Negative for abdominal pain, no bowel changes.  Musculoskeletal: Negative for gait problem or joint swelling.  Skin: Negative for rash.  Neurological: Negative for dizziness or headache.  No other specific complaints in a complete review of systems (except as listed in HPI above).  Objective  Vitals:   09/04/17 1438  BP: 120/74  Pulse: 70  Resp: 16  Temp: 97.8 F (36.6 C)  TempSrc: Oral  SpO2: 98%  Weight: 133 lb 9.6 oz (60.6 kg)  Height: 5\' 4"  (1.626 m)    Body mass index is 22.93 kg/m.  Physical Exam  Constitutional: Patient appears well-developed and well-nourished. No distress.  HEENT: head atraumatic, normocephalic, pupils equal and reactive to light,neck supple, throat within normal limits Cardiovascular: Normal rate, regular rhythm and normal heart sounds.  No murmur heard. No BLE edema. Pulmonary/Chest:  Effort normal and breath sounds normal. No respiratory distress. Abdominal: Soft.  There is no tenderness. Psychiatric: Patient has a normal mood and affect. behavior is normal. Judgment and thought content normal.  Recent Results (from the past 2160 hour(s))  POCT UA - Microalbumin     Status: Normal   Collection Time: 06/07/17 11:31 AM  Result Value Ref Range   Microalbumin Ur, POC neg mg/L   Creatinine, POC neg mg/dL   Albumin/Creatinine Ratio, Urine, POC neg      PHQ2/9: Depression screen [facility] 2/9 09/04/2017 04/17/2017 03/02/2017 11/24/2016 09/04/2016  Decreased Interest 2 0 0 0 0  Down, Depressed, Hopeless 2 0 0 0 0  PHQ - 2 Score 4 0 0 0 0  Altered  sleeping 2 - - - -  Tired, decreased energy 3 - - - -  Change in appetite 2 - - - -  Feeling bad or failure about yourself  0 - - - -  Trouble concentrating 0 - - - -  Moving slowly or fidgety/restless 2 - - - -  Suicidal thoughts 0 - - - -  PHQ-9 Score 13 - - - -  Difficult doing work/chores Somewhat difficult - - - -   GAD 7 : Generalized Anxiety Score 09/04/2017 06/04/2017  Nervous, Anxious, on Edge 2 2  Control/stop worrying 1 2  Worry too much - different things 2 2  Trouble relaxing 2 1  Restless 0 1  Easily annoyed or irritable - 2  Afraid - awful might happen 1 0  Total GAD 7 Score - 10  Anxiety Difficulty Somewhat difficult -     Fall Risk: Fall Risk  09/04/2017 06/04/2017 04/17/2017 03/02/2017 11/24/2016  Falls in the past year? No No No No No     Functional Status Survey: Is the patient deaf or have difficulty hearing?: No Does the patient have difficulty seeing, even when wearing glasses/contacts?: No Does the patient have difficulty concentrating, remembering, or making decisions?: No Does the patient have difficulty walking or climbing stairs?: No Does the patient have difficulty dressing or bathing?: No Does the patient have difficulty doing errands alone such as visiting a doctor's office or shopping?:  No    Assessment & Plan  1. PSVT (paroxysmal supraventricular tachycardia) (HCC)  taking Metoprolol and will see Dr. Kirke Corin today   2. Anxiety, generalized  - escitalopram (LEXAPRO) 10 MG tablet; Take 1 tablet (10 mg total) by mouth daily.  Dispense: 30 tablet; Refill: 2 - ALPRAZolam (XANAX XR) 0.5 MG 24 hr tablet; Take 1 tablet (0.5 mg total) by mouth daily.  Dispense: 30 tablet; Refill: 2  3. Panic attack  - escitalopram (LEXAPRO) 10 MG tablet; Take 1 tablet (10 mg total) by mouth daily.  Dispense: 30 tablet; Refill: 2  4. Chronic tension-type headache, intractable  Still has medication at home  5. Gastro-esophageal reflux disease without esophagitis  stable  6. Irritable bowel syndrome with constipation  Taking Linzess prn   7. Mild major depression (HCC)  Based on PHQ9 she is depressed, already on Lexapro, advised counseling, she does not like her job. She will contact the counselor again

## 2017-09-14 IMAGING — MG 2D DIGITAL SCREENING BILATERAL MAMMOGRAM WITH CAD AND ADJUNCT TO
7 series · 9 of 11 positions shown · non-contrast
Comparison: Previous exam(s).

CLINICAL DATA: Screening.

EXAM:
2D DIGITAL SCREENING BILATERAL MAMMOGRAM WITH CAD AND ADJUNCT TOMO

[L CC synth-2D]
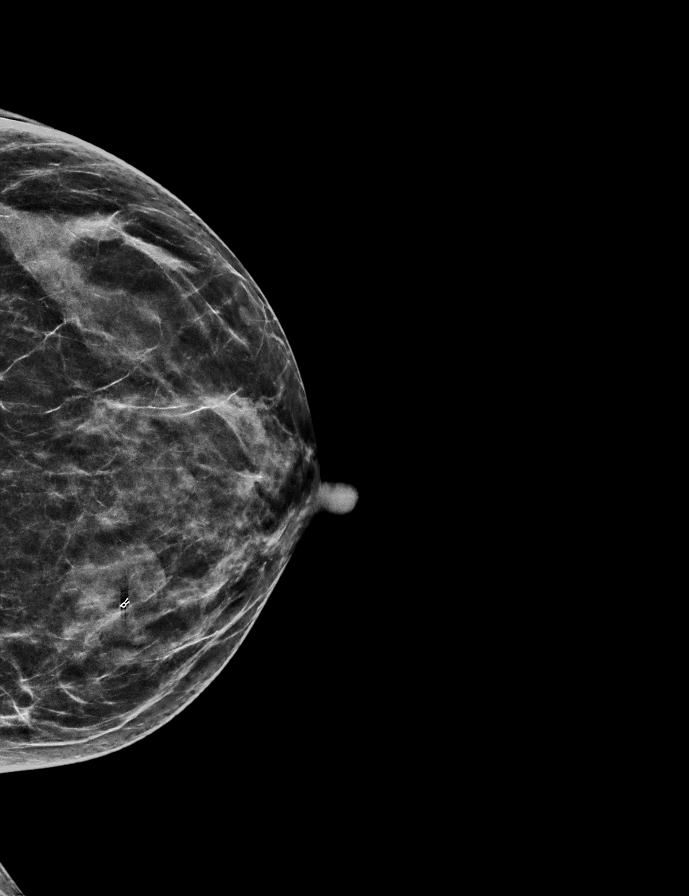

[R MLO]
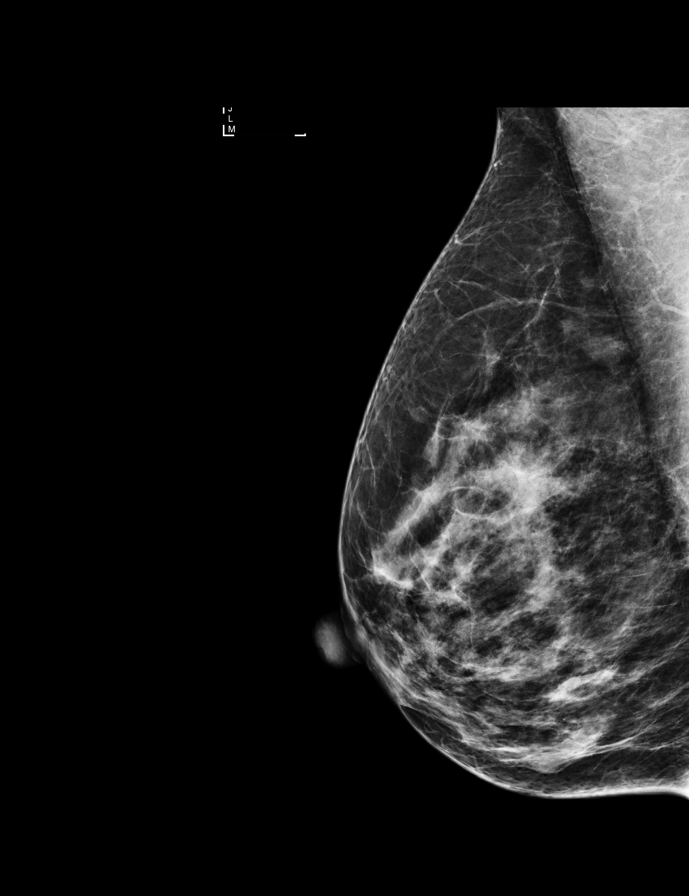

[L CC]
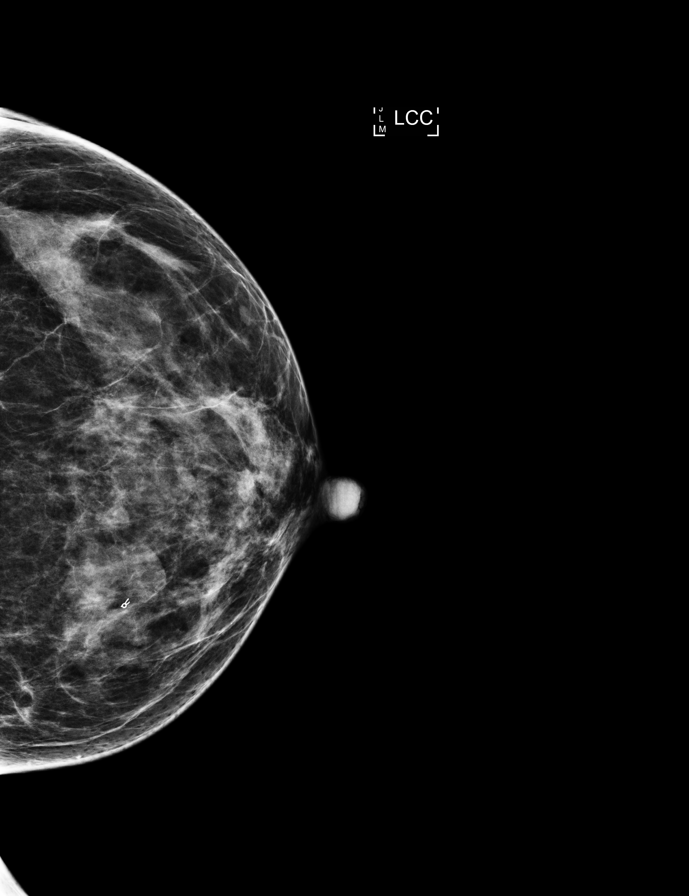

[R MLO synth-2D]
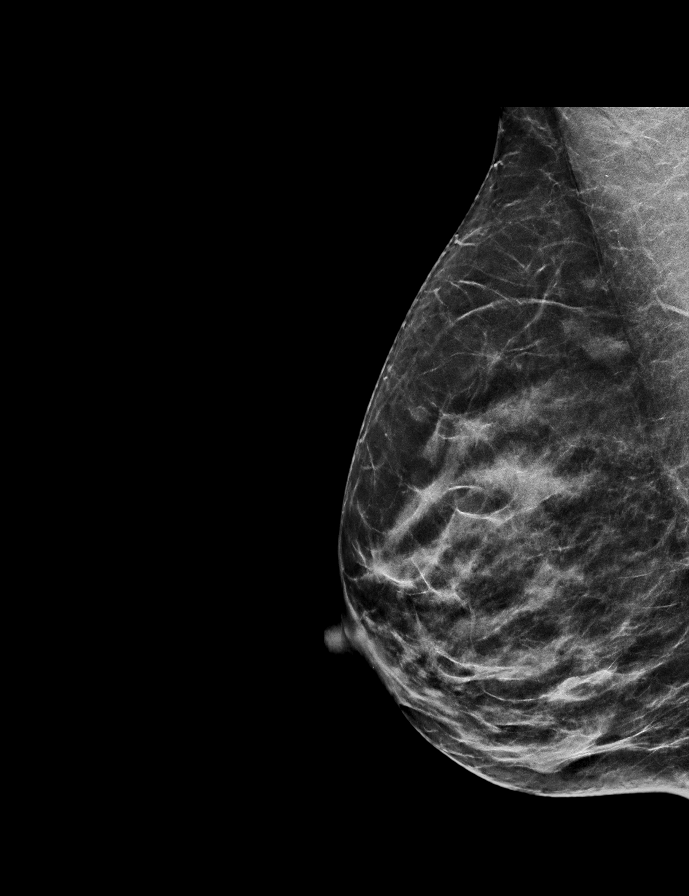

[R CC synth-2D]
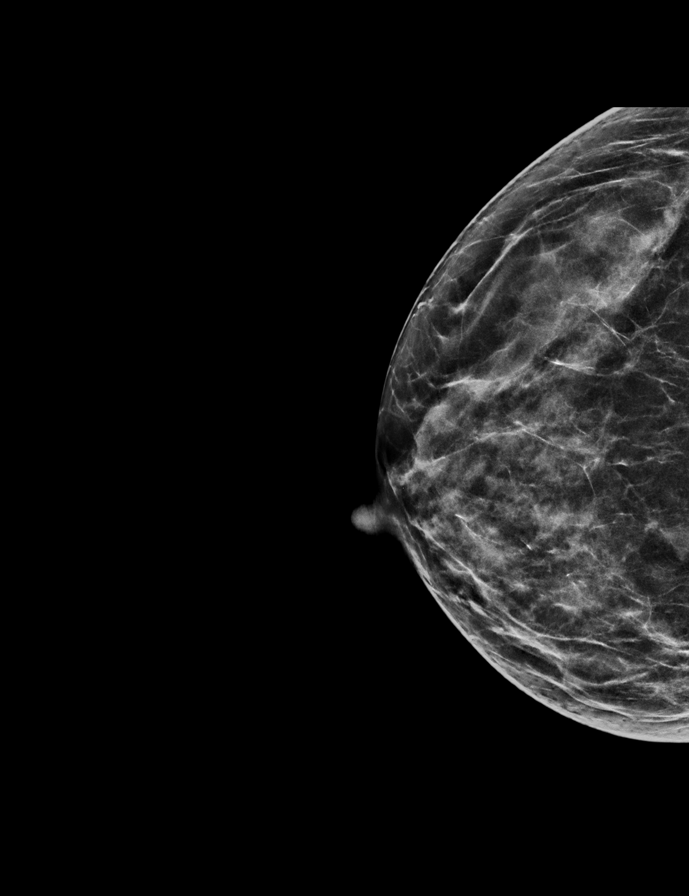

[L MLO synth-2D]
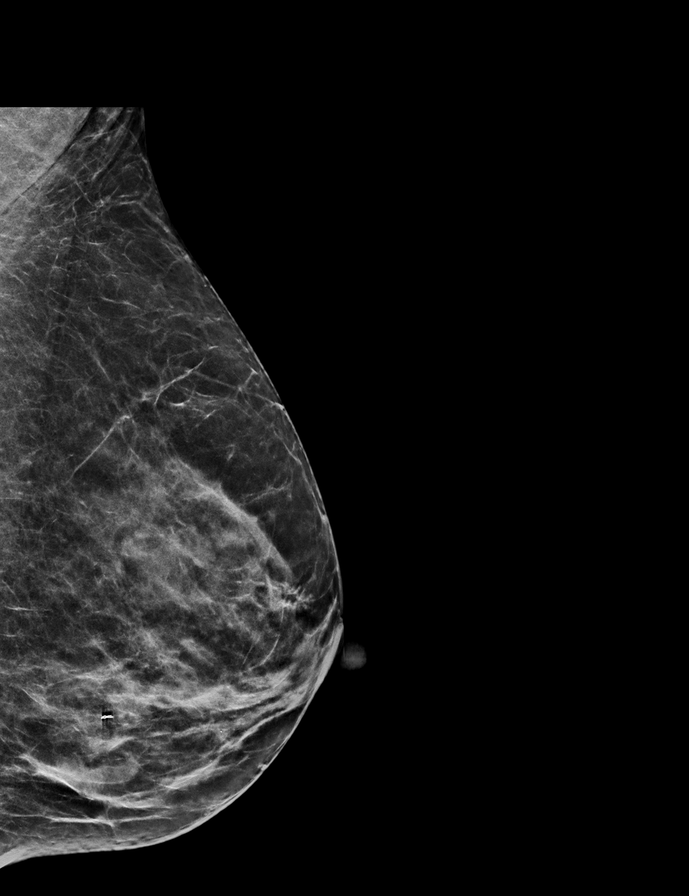

[L CC tomo · 3 of 52 frames shown]
[frame 17/52]
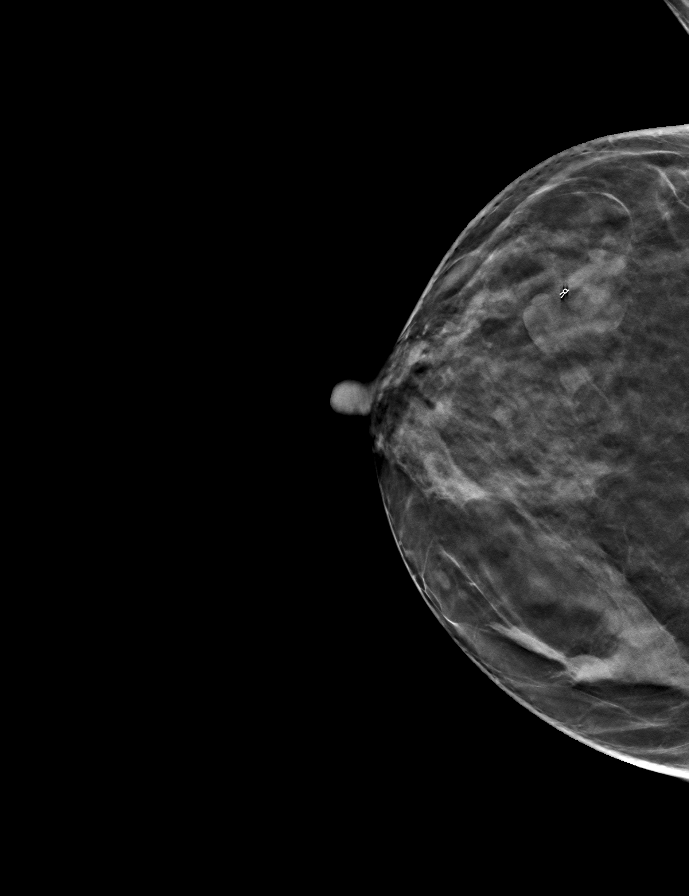
[frame 27/52]
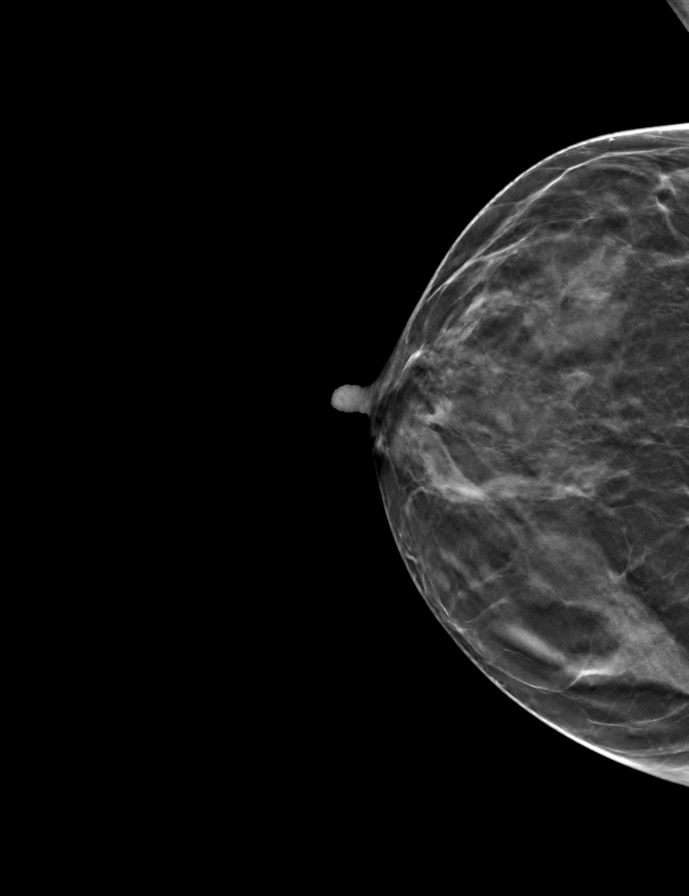
[frame 36/52]
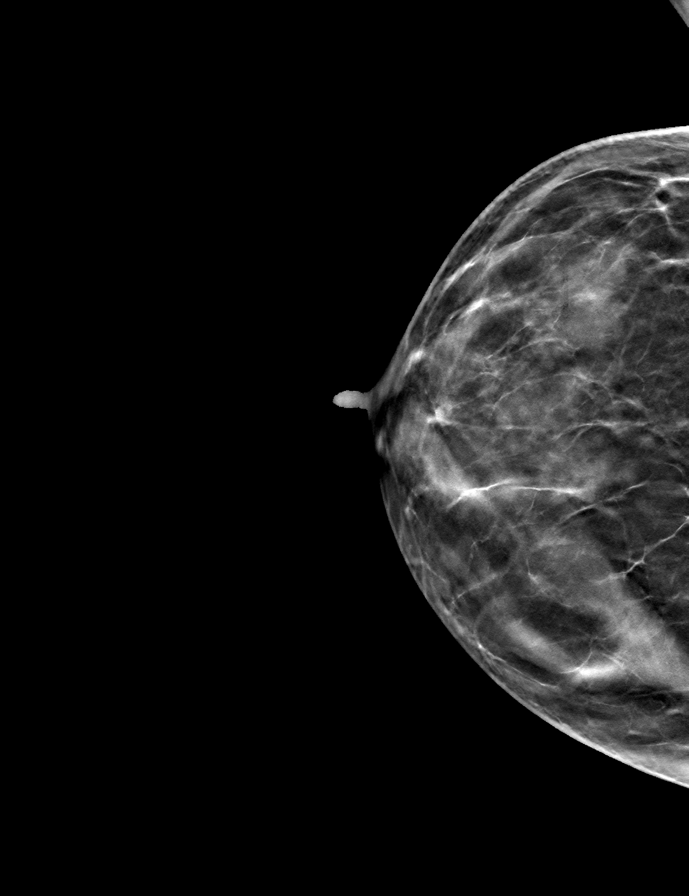

[9 of 11 positions shown; findings below may reference images not displayed]

ACR Breast Density Category c: The breast tissue is heterogeneously
dense, which may obscure small masses.
FINDINGS: There are no findings suspicious for malignancy. Images were
processed with CAD.
IMPRESSION: No mammographic evidence of malignancy. A result letter of this
screening mammogram will be mailed directly to the patient.

RECOMMENDATION:
Screening mammogram in one year. (Code:TN-[id number])

BI-RADS CATEGORY  1: Negative.

## 2017-12-10 ENCOUNTER — Ambulatory Visit: Payer: 59 | Admitting: Family Medicine

## 2017-12-10 ENCOUNTER — Encounter: Payer: Self-pay | Admitting: Family Medicine

## 2017-12-10 VITALS — BP 109/62 | HR 61 | Temp 97.8°F | Resp 16 | Ht 64.0 in | Wt 137.7 lb

## 2017-12-10 DIAGNOSIS — G44221 Chronic tension-type headache, intractable: Secondary | ICD-10-CM

## 2017-12-10 DIAGNOSIS — F320 Major depressive disorder, single episode, mild: Secondary | ICD-10-CM

## 2017-12-10 DIAGNOSIS — Z1211 Encounter for screening for malignant neoplasm of colon: Secondary | ICD-10-CM

## 2017-12-10 DIAGNOSIS — F411 Generalized anxiety disorder: Secondary | ICD-10-CM

## 2017-12-10 DIAGNOSIS — E559 Vitamin D deficiency, unspecified: Secondary | ICD-10-CM

## 2017-12-10 DIAGNOSIS — K581 Irritable bowel syndrome with constipation: Secondary | ICD-10-CM | POA: Diagnosis not present

## 2017-12-10 DIAGNOSIS — Z13220 Encounter for screening for lipoid disorders: Secondary | ICD-10-CM

## 2017-12-10 DIAGNOSIS — I471 Supraventricular tachycardia: Secondary | ICD-10-CM

## 2017-12-10 DIAGNOSIS — Z23 Encounter for immunization: Secondary | ICD-10-CM | POA: Diagnosis not present

## 2017-12-10 DIAGNOSIS — F410 Panic disorder [episodic paroxysmal anxiety] without agoraphobia: Secondary | ICD-10-CM

## 2017-12-10 DIAGNOSIS — R7303 Prediabetes: Secondary | ICD-10-CM | POA: Diagnosis not present

## 2017-12-10 LAB — CBC WITH DIFFERENTIAL/PLATELET
BASOS ABS: 43 {cells}/uL (ref 0–200)
Basophils Relative: 0.5 %
EOS ABS: 206 {cells}/uL (ref 15–500)
Eosinophils Relative: 2.4 %
HEMATOCRIT: 36.6 % (ref 35.0–45.0)
HEMOGLOBIN: 12.4 g/dL (ref 11.7–15.5)
LYMPHS ABS: 2382 {cells}/uL (ref 850–3900)
MCH: 30 pg (ref 27.0–33.0)
MCHC: 33.9 g/dL (ref 32.0–36.0)
MCV: 88.6 fL (ref 80.0–100.0)
MONOS PCT: 5 %
MPV: 12.1 fL (ref 7.5–12.5)
NEUTROS PCT: 64.4 %
Neutro Abs: 5538 cells/uL (ref 1500–7800)
Platelets: 175 10*3/uL (ref 140–400)
RBC: 4.13 10*6/uL (ref 3.80–5.10)
RDW: 13.4 % (ref 11.0–15.0)
Total Lymphocyte: 27.7 %
WBC mixed population: 430 cells/uL (ref 200–950)
WBC: 8.6 10*3/uL (ref 3.8–10.8)

## 2017-12-10 LAB — COMPLETE METABOLIC PANEL WITH GFR
AG RATIO: 1.5 (calc) (ref 1.0–2.5)
ALBUMIN MSPROF: 4.3 g/dL (ref 3.6–5.1)
ALKALINE PHOSPHATASE (APISO): 87 U/L (ref 33–130)
ALT: 18 U/L (ref 6–29)
AST: 21 U/L (ref 10–35)
BILIRUBIN TOTAL: 0.3 mg/dL (ref 0.2–1.2)
BUN: 16 mg/dL (ref 7–25)
CHLORIDE: 104 mmol/L (ref 98–110)
CO2: 31 mmol/L (ref 20–32)
Calcium: 9.7 mg/dL (ref 8.6–10.4)
Creat: 0.92 mg/dL (ref 0.50–1.05)
GFR, Est African American: 81 mL/min/{1.73_m2} (ref >=60)
GFR, Est Non African American: 70 mL/min/{1.73_m2} (ref >=60)
GLOBULIN: 2.9 g/dL (ref 1.9–3.7)
Glucose, Bld: 82 mg/dL (ref 65–139)
POTASSIUM: 4 mmol/L (ref 3.5–5.3)
SODIUM: 139 mmol/L (ref 135–146)
Total Protein: 7.2 g/dL (ref 6.1–8.1)

## 2017-12-10 LAB — LIPID PANEL
CHOL/HDL RATIO: 2.9 (calc) (ref <5.0)
CHOLESTEROL: 196 mg/dL (ref <200)
HDL: 67 mg/dL (ref >50)
LDL Cholesterol (Calc): 112 mg/dL (calc) — ABNORMAL HIGH
Non-HDL Cholesterol (Calc): 129 mg/dL (calc) (ref <130)
Triglycerides: 84 mg/dL (ref <150)

## 2017-12-10 MED ORDER — ALPRAZOLAM ER 0.5 MG PO TB24: 0.5000 mg | ORAL_TABLET | Freq: Every day | ORAL | 2 refills | Status: DC

## 2017-12-10 MED ORDER — ESCITALOPRAM OXALATE 10 MG PO TABS
10.0000 mg | ORAL_TABLET | Freq: Every day | ORAL | 2 refills | Status: DC
Start: 2017-12-10 — End: 2018-06-12

## 2017-12-10 MED ORDER — ZOSTER VAC RECOMB ADJUVANTED 50 MCG/0.5ML IM SUSR: 0.5000 mL | Freq: Once | INTRAMUSCULAR | 1 refills | Status: AC

## 2017-12-10 NOTE — Progress Notes (Signed)
Name: Chelsea Decker   MRN: [id number]    DOB: 1961-07-02   Date:12/10/2017       Progress Note  Subjective  Chief Complaint  Chief Complaint  Patient presents with  . Medication Refill  . Anxiety  . Headache    Still having headaches on the right side that radiates to her ear     HPI   Tension Headaches: she has episodes of headaches pain is described as a throbbing or sharp sensation, and radiates to right side of neck, sometimes on left sid, at times on temporal area. No photophobia or phonophobia, or focal deficits. Lasts about one hour. Episodes are down to at most twice a week, takes Fioricet prn and works within 10-15 minutes.She still has medication at home. Unchanged   IBS with constipation: symptoms have improved with Linzess,she is on lower dose, bowel movements daily now, occasionally twice per day, no blood in stools or mucus. Pain is still intermittent, but not daily anymore and mild when present, and constipation is controlled with Linzess  GERD:she has been out of Pantoprazole and denies heart burn or indigestion. Unchanged   SVT: seen by Dr. Kirke Corin, stress test in 2013 showed HR went up to 220 with maximum activity, advised to avoid caffeine.She is on Metoprolol . Last visit to   to [facility] was in July 2018 and it was secondary to panic attack causing chest pain. She states she has a fit bit and while at rest and had palpitation, she  looked down on her fitbit and HR was 150, it lasted about 10 minutes and resolved by itself. Symptoms still controlled with medication   GAD: Chelsea Decker had to take some time off work back in September 2016 because of severe anxiety associated with her work. 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 She still feels like she has more work than her co-workers. She states she is taking Lexapro and Alprazolam XR  , she is still  anxious, mostly work related but doing well on medication and wants to continue without changes.   Pre-diabetes: no polyphagia, polydipsia or polyuria, last hgbA1C was normal. .    Patient Active Problem List   Diagnosis Date Noted  . History of shingles 04/17/2017  . Post-menopause on HRT (hormone replacement therapy) 11/05/2015  . Fasting hyperglycemia 08/10/2015  . Anxiety, generalized 03/20/2015  . Bradycardia 03/20/2015  . Chronic tension-type headache, intractable 03/20/2015  . Gastro-esophageal reflux disease without esophagitis 03/20/2015  . IBS (irritable bowel syndrome) 03/20/2015  . Menopause 03/20/2015  . Vitamin D deficiency 03/20/2015  . Vasovagal syncope 08/18/2014  . PSVT (paroxysmal supraventricular tachycardia) (HCC) 04/12/2012  . External hemorrhoids 07/15/2007    Past Surgical History:  Procedure Laterality Date  . ABDOMINAL HYSTERECTOMY     2013  . BREAST BIOPSY Left 2013   core w/clip - neg  . LAPAROSCOPIC TOTAL HYSTERECTOMY      Family History  Problem Relation Age of Onset  . Cancer Mother   . Diabetes Mother   . Cancer Father   . Diabetes Father   . Breast cancer Neg Hx     Social History   Socioeconomic History  . Marital status: Divorced    Spouse name: Not on file  . Number of children: Not on file  . Years of education: Not on file  . Highest education level: Not on file  Occupational History  . Not on file  Social Needs  . Financial resource strain: Not on file  . Food insecurity:    Worry: Not on file    Inability: Not on file  . Transportation needs:    Medical: Not on file    Non-medical: Not on file  Tobacco Use  . Smoking status: Never Smoker  . Smokeless tobacco: Never Used  Substance and Sexual Activity  . Alcohol use: No    Alcohol/week: 0.0 oz  . Drug use: No  . Sexual activity: Yes  Lifestyle  . Physical activity:    Days per week: Not on file    Minutes per session: Not on file  . Stress: Not on file   Relationships  . Social connections:    Talks on phone: Not on file    Gets together: Not on file    Attends religious service: Not on file    Active member of club or organization: Not on file    Attends meetings of clubs or organizations: Not on file    Relationship status: Not on file  . Intimate partner violence:    Fear of current or ex partner: Not on file    Emotionally abused: Not on file    Physically abused: Not on file    Forced sexual activity: Not on file  Other Topics Concern  . Not on file  Social History Narrative  . Not on file     Current Outpatient Medications:  .  acetaminophen (TYLENOL) 500 MG chewable tablet, Chew 500 mg by mouth every 6 (six) hours as needed., Disp: , Rfl:  .  ALPRAZolam (XANAX XR) 0.5 MG 24 hr tablet, Take 1 tablet (0.5 mg total) by mouth daily., Disp: 30 tablet, Rfl: 2 .  aspirin 81 MG tablet, Take 81 mg by mouth as needed. , Disp: , Rfl:  .  butalbital-acetaminophen-caffeine (FIORICET WITH CODEINE) 50-325-40-30 MG capsule, TAKE ONE CAPSULE BY MOUTH EVERY 4 HOURS AS NEEDED FOR HEADACHE, Disp: 30 capsule, Rfl: 0 .  Cholecalciferol (VITAMIN D) 2000 UNITS tablet, Take 2,000 Units by mouth daily., Disp: , Rfl:  .  escitalopram (LEXAPRO) 10 MG tablet, Take 1 tablet (10 mg total) by mouth daily., Disp: 30 tablet, Rfl: 2 .  linaclotide (LINZESS) 72 MCG capsule, Take 1 capsule (72 mcg total) by mouth daily. (Patient taking differently: Take 72 mcg by mouth daily as needed. ), Disp: 30 capsule, Rfl: 2 .  metoprolol tartrate (LOPRESSOR) 25 MG tablet, TAKE 1 TABLET (25 MG TOTAL) BY MOUTH 2 (TWO) TIMES DAILY., Disp: 60 tablet, Rfl: 3 .  Zoster Vaccine Adjuvanted [facility]) injection, Inject 0.5 mLs into the muscle once for 1 dose., Disp: 0.5 mL, Rfl: 1  No Known Allergies   ROS  Constitutional: Negative for fever or weight change.  Respiratory: Negative for cough and shortness of breath.   Cardiovascular: Negative for chest pain or palpitations.   Gastrointestinal: positive  for intermittent abdominal pain, no bowel changes - she has IBS.  Musculoskeletal: Negative for gait problem or joint swelling.  Skin: Negative for rash.  Neurological: Negative for dizziness, positive for intermittent  headache.  No other specific complaints in a complete review of systems (except as listed in HPI above).  Objective  Vitals:   12/10/17 1408  BP: 109/62  Pulse: 61  Resp: 16  Temp: 97.8 F (36.6 C)  TempSrc: Oral  SpO2: 94%  Weight: 137 lb 11.2 oz (62.5 kg)  Height:  (1.626  m)    Body mass index is 23.64 kg/m.  Physical Exam  Constitutional: Patient appears well-developed and well-nourished. No distress.  HEENT: head atraumatic, normocephalic, pupils equal and reactive to light,  neck supple, throat within normal limits Cardiovascular: Normal rate, regular rhythm and normal heart sounds.  No murmur heard. No BLE edema. Pulmonary/Chest: Effort normal and breath sounds normal. No respiratory distress. Abdominal: Soft.  There is no tenderness. Psychiatric: Patient has a normal mood and affect. behavior is normal. Judgment and thought content normal.  PHQ2/9: Depression screen [facility] 2/9 12/10/2017 09/04/2017 04/17/2017 03/02/2017 11/24/2016  Decreased Interest 2 2 0 0 0  Down, Depressed, Hopeless 2 2 0 0 0  PHQ - 2 Score 4 4 0 0 0  Altered sleeping 0 2 - - -  Tired, decreased energy 3 3 - - -  Change in appetite 0 2 - - -  Feeling bad or failure about yourself  0 0 - - -  Trouble concentrating 0 0 - - -  Moving slowly or fidgety/restless 0 2 - - -  Suicidal thoughts - 0 - - -  PHQ-9 Score 7 13 - - -  Difficult doing work/chores - Somewhat difficult - - -    Fall Risk: Fall Risk  12/10/2017 09/04/2017 06/04/2017 04/17/2017 03/02/2017  Falls in the past year? No No No No No     Functional Status Survey: Is the patient deaf or have difficulty hearing?: No Does the patient have difficulty seeing, even when wearing  glasses/contacts?: Yes Does the patient have difficulty concentrating, remembering, or making decisions?: No Does the patient have difficulty walking or climbing stairs?: No Does the patient have difficulty dressing or bathing?: No Does the patient have difficulty doing errands alone such as visiting a doctor's office or shopping?: No    Assessment & Plan  1. Anxiety, generalized  - ALPRAZolam (XANAX XR) 0.5 MG 24 hr tablet; Take 1 tablet (0.5 mg total) by mouth daily.  Dispense: 30 tablet; Refill: 2 - escitalopram (LEXAPRO) 10 MG tablet; Take 1 tablet (10 mg total) by mouth daily.  Dispense: 30 tablet; Refill: 2  2. Panic attack  - escitalopram (LEXAPRO) 10 MG tablet; Take 1 tablet (10 mg total) by mouth daily.  Dispense: 30 tablet; Refill: 2  3. Mild major depression (HCC)  - CBC with Differential/Platelet - COMPLETE METABOLIC PANEL WITH GFR  4. Irritable bowel syndrome with constipation  Taking Linzess prn   5. Chronic tension-type headache, intractable  Stable, taking fioricet prn   6. Pre-diabetes  - Hemoglobin A1c  7. Vitamin D deficiency  Continue vitamin D supplementation   8. PSVT (paroxysmal supraventricular tachycardia) (HCC)  Well controlled with metoprolol given by Dr. Kirke Corin  9. Need for lipid screening  - Lipid panel  10. Need for shingles vaccine  - Zoster Vaccine Adjuvanted [facility]) injection; Inject 0.5 mLs into the muscle once for 1 dose.  Dispense: 0.5 mL; Refill: 1  11. Colon cancer screening  - Ambulatory referral to Gastroenterology

## 2017-12-10 NOTE — Patient Instructions (Signed)
Call insurance and find out if colonoscopy or cologuard are covered and which one would be cheaper

## 2017-12-11 LAB — HEMOGLOBIN A1C
EAG (MMOL/L): 6.6 (calc)
Hgb A1c MFr Bld: 5.8 % of total Hgb — ABNORMAL HIGH (ref <5.7)
Mean Plasma Glucose: 120 (calc)

## 2017-12-14 ENCOUNTER — Telehealth: Payer: Self-pay | Admitting: Family Medicine

## 2017-12-14 NOTE — Telephone Encounter (Signed)
Copied from CRM [id number]. Topic: Quick Communication - Lab Results >> Dec 14, 2017  3:31 PM Phineas Semen, CMA wrote: Called patient to inform them of most recent lab results. When patient returns call, triage nurse may disclose results.

## 2017-12-17 NOTE — Telephone Encounter (Signed)
Results given on 5/3. See result note

## 2017-12-20 ENCOUNTER — Telehealth: Payer: Self-pay | Admitting: Gastroenterology

## 2017-12-20 NOTE — Telephone Encounter (Signed)
Pt left vm to schedule colonoscopy  °

## 2017-12-24 ENCOUNTER — Telehealth: Payer: Self-pay

## 2017-12-24 ENCOUNTER — Other Ambulatory Visit: Payer: Self-pay

## 2017-12-24 DIAGNOSIS — Z1211 Encounter for screening for malignant neoplasm of colon: Secondary | ICD-10-CM

## 2017-12-24 NOTE — Telephone Encounter (Signed)
Gastroenterology Pre-Procedure Review  Request Date: 01/18/18 Requesting Physician: Dr. Tobi Bastos  PATIENT REVIEW QUESTIONS: The patient responded to the following health history questions as indicated:    1. Are you having any GI issues? yes (IBS, GERD) 2. Do you have a personal history of Polyps? no 3. Do you have a family history of Colon Cancer or Polyps? no 4. Diabetes Mellitus? no 5. Joint replacements in the past 12 months?no 6. Major health problems in the past 3 months?no 7. Any artificial heart valves, MVP, or defibrillator?no    MEDICATIONS & ALLERGIES:    Patient reports the following regarding taking any anticoagulation/antiplatelet therapy:   Plavix, Coumadin, Eliquis, Xarelto, Lovenox, Pradaxa, Brilinta, or Effient? no Aspirin? yes (81 mg)  Patient confirms/reports the following medications:  Current Outpatient Medications  Medication Sig Dispense Refill  . acetaminophen (TYLENOL) 500 MG chewable tablet Chew 500 mg by mouth every 6 (six) hours as needed.    . ALPRAZolam (XANAX XR) 0.5 MG 24 hr tablet Take 1 tablet (0.5 mg total) by mouth daily. 30 tablet 2  . aspirin 81 MG tablet Take 81 mg by mouth as needed.     . butalbital-acetaminophen-caffeine (FIORICET WITH CODEINE) 50-325-40-30 MG capsule TAKE ONE CAPSULE BY MOUTH EVERY 4 HOURS AS NEEDED FOR HEADACHE 30 capsule 0  . Cholecalciferol (VITAMIN D) 2000 UNITS tablet Take 2,000 Units by mouth daily.    Marland Kitchen escitalopram (LEXAPRO) 10 MG tablet Take 1 tablet (10 mg total) by mouth daily. 30 tablet 2  . linaclotide (LINZESS) 72 MCG capsule Take 1 capsule (72 mcg total) by mouth daily. (Patient taking differently: Take 72 mcg by mouth daily as needed. ) 30 capsule 2  . metoprolol tartrate (LOPRESSOR) 25 MG tablet TAKE 1 TABLET (25 MG TOTAL) BY MOUTH 2 (TWO) TIMES DAILY. 60 tablet 3   No current facility-administered medications for this visit.     Patient confirms/reports the following allergies:  No Known Allergies  No  orders of the defined types were placed in this encounter.   AUTHORIZATION INFORMATION Primary Insurance: 1D#: Group #:  Secondary Insurance: 1D#: Group #:  SCHEDULE INFORMATION: Date: 06/07/19Time: Location:[facility]

## 2018-01-03 ENCOUNTER — Other Ambulatory Visit: Payer: Self-pay | Admitting: Family Medicine

## 2018-01-03 ENCOUNTER — Ambulatory Visit
Admission: RE | Admit: 2018-01-03 | Discharge: 2018-01-03 | Disposition: A | Discharge status: Home / Self Care | Payer: PRIVATE HEALTH INSURANCE | Source: Ambulatory Visit | Attending: Family Medicine | Admitting: Family Medicine

## 2018-01-03 DIAGNOSIS — M25562 Pain in left knee: Secondary | ICD-10-CM | POA: Diagnosis present

## 2018-01-03 DIAGNOSIS — W19XXXA Unspecified fall, initial encounter: Secondary | ICD-10-CM

## 2018-01-03 DIAGNOSIS — S82092A Other fracture of left patella, initial encounter for closed fracture: Secondary | ICD-10-CM

## 2018-01-03 DIAGNOSIS — M25522 Pain in left elbow: Secondary | ICD-10-CM | POA: Diagnosis present

## 2018-01-03 DIAGNOSIS — S82002A Unspecified fracture of left patella, initial encounter for closed fracture: Secondary | ICD-10-CM | POA: Insufficient documentation

## 2018-01-03 DIAGNOSIS — R52 Pain, unspecified: Secondary | ICD-10-CM

## 2018-01-03 HISTORY — DX: Other fracture of left patella, initial encounter for closed fracture: S82.092A

## 2018-01-14 ENCOUNTER — Telehealth: Payer: Self-pay

## 2018-01-14 NOTE — Telephone Encounter (Signed)
Patient contacted office to reschedule her colonoscopy due to a recent fall.  Her colonoscopy has been moved from 06/07 to 02/08/18 with Dr. Tobi BastosAnna.  Vikki in Endo has been made aware of change.

## 2018-01-17 ENCOUNTER — Other Ambulatory Visit: Payer: Self-pay | Admitting: Family Medicine

## 2018-01-17 NOTE — Telephone Encounter (Signed)
Refill request for general medication: Fioricet with Codeine 50-325-40-30  Last office visit: 12/10/2017  Last physical exam: None indicated  Follow-ups on file. 03/11/2018

## 2018-02-07 ENCOUNTER — Telehealth: Payer: Self-pay

## 2018-02-07 NOTE — Telephone Encounter (Signed)
Pt calls and cancels colonoscopy tomorrow (6/28). She is on light duty at work, knee brace will remain until sometime in August. I informed pt that I will have a recall letter sent out for September to remind her to contact office to reschedule at that time.

## 2018-02-08 ENCOUNTER — Encounter: Admission: RE | Payer: Self-pay | Source: Ambulatory Visit

## 2018-02-08 ENCOUNTER — Ambulatory Visit: Admission: RE | Admit: 2018-02-08 | Payer: 59 | Source: Ambulatory Visit | Admitting: Gastroenterology

## 2018-02-08 SURGERY — COLONOSCOPY WITH PROPOFOL
Anesthesia: General

## 2018-02-20 ENCOUNTER — Ambulatory Visit: Payer: PRIVATE HEALTH INSURANCE | Attending: Orthopedic Surgery | Admitting: Physical Therapy

## 2018-02-20 DIAGNOSIS — M6281 Muscle weakness (generalized): Secondary | ICD-10-CM | POA: Diagnosis present

## 2018-02-20 DIAGNOSIS — M25562 Pain in left knee: Secondary | ICD-10-CM | POA: Insufficient documentation

## 2018-02-20 DIAGNOSIS — R269 Unspecified abnormalities of gait and mobility: Secondary | ICD-10-CM | POA: Insufficient documentation

## 2018-02-20 DIAGNOSIS — M25662 Stiffness of left knee, not elsewhere classified: Secondary | ICD-10-CM | POA: Insufficient documentation

## 2018-02-21 ENCOUNTER — Other Ambulatory Visit: Payer: Self-pay

## 2018-02-21 ENCOUNTER — Encounter: Payer: Self-pay | Admitting: Physical Therapy

## 2018-02-21 NOTE — Patient Instructions (Signed)
Access Code: [email or web address]  URL: [email or web address]  Date: 02/21/2018  Prepared by: Dorene GrebeMichael Sherk   Exercises  Seated Long Arc Quad - 20 reps - 1 sets - 2 hold - 2x daily - 7x weekly  Sit to Stand - 10 reps - 2 sets - 2x daily - 7x weekly  Supine Quad Set - 20 reps - 1 sets - 5 hold - 2x daily - 7x weekly  Straight Leg Raise - 20 reps - 1 sets - 2 hold - 2x daily - 7x weekly  Supine Heel Slide - 20 reps - 1 sets - 2 hold - 2x daily - 7x weekly

## 2018-02-23 NOTE — Therapy (Signed)
[facility] [address] [place], [place], [address] Phone: [phone]   Fax:  [phone]  Physical Therapy Evaluation  Patient Details  Name: Chelsea Decker MRN: [id number] Date of Birth: [birth year] Referring Provider: Dr. Odis LusterBowers   Encounter Date: 02/20/2018   Treatment 1 of 12.  Recert date: 04/03/18   Past Medical History:  Diagnosis Date  . Anxiety   . Asthma   . External hemorrhoids without mention of complication   . GERD (gastroesophageal reflux disease)   . Heart palpitations   . IBS (irritable bowel syndrome)   . Lump or mass in breast   . Palpitations   . PSVT (paroxysmal supraventricular tachycardia) (HCC) 04/12/2012  . Unspecified vitamin D deficiency     Past Surgical History:  Procedure Laterality Date  . ABDOMINAL HYSTERECTOMY     2013  . BREAST BIOPSY Left 2013   core w/clip - neg  . LAPAROSCOPIC TOTAL HYSTERECTOMY      There were no vitals filed for this visit.     S/p L patella fracture due to fall at work on 01/03/18     See HEP     Pt. is a pleasant 57 y/o female s/p L patella fracture (no surgery) after sustaining a fall at work on 01/03/18. Pt. reports 7/10 L knee pain (medial aspect) with walking and entered PT with use of B axillary crutches. Pt. presents with L medial knee/ patella tenderness with palpation. Slight B patellar hypomobility noted with mobs. (all planes). R knee AROM: 0 to 140 deg./ L knee AROM: 0-116 deg. (PROM 124 deg.). Hamstring length: R 75 deg./ L 64 deg. Mimimal L knee joint line swelling noted: L 35 cm./ R 34.5 cm. R LE strength: 5/5 MMT quad/ hamstring. 4+/5 hip flexion. 5/5 ankle. L LE strength grossly 4/5 MMT. Pt. has progressed well to use of SPC with 2-point gait pattern and able to ascend/ descend stairs with step to pattern. Pt. will benefit from skilled PT services to increase L knee ROM/ stability to improve pain-free mobility.     PT Long Term Goals -  02/23/18 1021      PT LONG TERM GOAL #1   Title  Pt. will increase FOTO to 49 to improve pain-free mobility/ return to work.      Baseline  FOTO: baseline 26.      Time  6    Period  Weeks    Status  New    Target Date  04/03/18      PT LONG TERM GOAL #2   Title  Pt. will increase L knee AROM to >130 deg. to improve pain-free mobility/ walking.     Baseline  L knee AROM: 0 to 116 deg.    Time  6    Period  Weeks    Status  New    Target Date  04/03/18      PT LONG TERM GOAL #3   Title  Pt. will increase L hip/knee strength to grossly 4+/5 MMT to improve pain-free mobility/ return to work without restriction.      Baseline  R LE strength: 5/5 MMT quad/ hamstring. 4+/5 hip flexion. 5/5 ankle. L LE strength grossly 4/5 MMT    Time  6    Period  Weeks    Status  New    Target Date  04/03/18      PT LONG TERM GOAL #4   Title  Pt. able to ambulate with  normalized gait pattern and no assistive device community distances safely.      Baseline  Slight L antalgic gait with use of SPC    Time  6    Period  Weeks    Status  New    Target Date  04/03/18      PT LONG TERM GOAL #5   Title  Pt. able to return to work at [employer] with no L knee pain or limitations.      Baseline  pt. working Light [employer] at this time.     Time  6    Period  Weeks    Status  New    Target Date  04/03/18               Patient will benefit from skilled therapeutic intervention in order to improve the following deficits and impairments:  Abnormal gait, Improper body mechanics, Pain, Decreased mobility, Decreased activity tolerance, Decreased endurance, Decreased range of motion, Decreased strength, Hypomobility, Impaired flexibility, Difficulty walking, Decreased balance  Visit Diagnosis: Acute pain of left knee  Joint stiffness of knee, left  Muscle weakness (generalized)  Gait difficulty     Problem List Patient Active Problem List   Diagnosis Date Noted  . History of  shingles 04/17/2017  . Post-menopause on HRT (hormone replacement therapy) 11/05/2015  . Fasting hyperglycemia 08/10/2015  . Anxiety, generalized 03/20/2015  . Bradycardia 03/20/2015  . Chronic tension-type headache, intractable 03/20/2015  . Gastro-esophageal reflux disease without esophagitis 03/20/2015  . IBS (irritable bowel syndrome) 03/20/2015  . Menopause 03/20/2015  . Vitamin D deficiency 03/20/2015  . Vasovagal syncope 08/18/2014  . PSVT (paroxysmal supraventricular tachycardia) (HCC) 04/12/2012  . External hemorrhoids 07/15/2007   [name], PT, DPT # [phone] 02/23/2018, 10:26 AM  [facility] [address] [place], [place], [address] Phone: [phone]   Fax:  [phone]  Name: Chelsea Decker MRN: [id number] Date of Birth: 19-Feb-1961

## 2018-02-26 ENCOUNTER — Encounter: Payer: Self-pay | Admitting: Physical Therapy

## 2018-02-26 ENCOUNTER — Ambulatory Visit: Payer: PRIVATE HEALTH INSURANCE | Admitting: Physical Therapy

## 2018-02-26 DIAGNOSIS — M25562 Pain in left knee: Secondary | ICD-10-CM

## 2018-02-26 DIAGNOSIS — M25662 Stiffness of left knee, not elsewhere classified: Secondary | ICD-10-CM

## 2018-02-26 DIAGNOSIS — R269 Unspecified abnormalities of gait and mobility: Secondary | ICD-10-CM

## 2018-02-26 DIAGNOSIS — M6281 Muscle weakness (generalized): Secondary | ICD-10-CM

## 2018-02-28 ENCOUNTER — Ambulatory Visit: Payer: PRIVATE HEALTH INSURANCE | Admitting: Physical Therapy

## 2018-02-28 ENCOUNTER — Encounter: Payer: Self-pay | Admitting: Physical Therapy

## 2018-02-28 DIAGNOSIS — M25662 Stiffness of left knee, not elsewhere classified: Secondary | ICD-10-CM

## 2018-02-28 DIAGNOSIS — R269 Unspecified abnormalities of gait and mobility: Secondary | ICD-10-CM

## 2018-02-28 DIAGNOSIS — M25562 Pain in left knee: Secondary | ICD-10-CM | POA: Diagnosis not present

## 2018-02-28 DIAGNOSIS — M6281 Muscle weakness (generalized): Secondary | ICD-10-CM

## 2018-02-28 NOTE — Therapy (Signed)
[facility] [address] [place], [place], [address] Phone: [phone]   Fax:  [phone]  Physical Therapy Treatment  Patient Details  Name: JULANNE SCHLUETER MRN: [id number] Date of Birth: August 17, 1960 Referring Provider: Dr. Odis Luster   Encounter Date: 02/26/2018    Treatment 2 of 12.  Recert date:  6/96/29   Past Medical History:  Diagnosis Date  . Anxiety   . Asthma   . External hemorrhoids without mention of complication   . GERD (gastroesophageal reflux disease)   . Heart palpitations   . IBS (irritable bowel syndrome)   . Lump or mass in breast   . Palpitations   . PSVT (paroxysmal supraventricular tachycardia) (HCC) 04/12/2012  . Unspecified vitamin D deficiency     Past Surgical History:  Procedure Laterality Date  . ABDOMINAL HYSTERECTOMY     2013  . BREAST BIOPSY Left 2013   core w/clip - neg  . LAPAROSCOPIC TOTAL HYSTERECTOMY      There were no vitals filed for this visit.    Pt. entered PT with use of SPC on L side (PT corrected use for 2-point gait on R side).       Treatment:   There.ex.: Scifit L5 10 min. B UE/LE (no charge/ warm-up)- consistent knee flexion on L. Supine ball: knee to chest/ bridging 20x each (PT cuing). Supine hip abduction with RTB 20x/ hip flexion with RTB 20x each Seated hamstring curls with RTB 30x L/R.  Amb. In clinic with use of SPC (mod. Cuing for proper 2-point gait pattern/ step pattern) Ascending/descending stairs with recip. Pattern  Manual tx. Supine L hip/knee stretches (11 min.).  L knee AROM: 134 deg. After stretches (marked increase since initial evaluation).    Reviewed HEP.         Pt. requires mod. cuing for proper 2-point gait pattern with use of SPC on R side. Pt. progressing well with L knee flexion (134 deg.) after stretches/ manual tx. Pt. doing well with current HEP but needs cuing for proper technique/ breathing.       PT Long Term  Goals - 02/23/18 1021      PT LONG TERM GOAL #1   Title  Pt. will increase FOTO to 49 to improve pain-free mobility/ return to work.      Baseline  FOTO: baseline 26.      Time  6    Period  Weeks    Status  New    Target Date  04/03/18      PT LONG TERM GOAL #2   Title  Pt. will increase L knee AROM to >130 deg. to improve pain-free mobility/ walking.     Baseline  L knee AROM: 0 to 116 deg.    Time  6    Period  Weeks    Status  New    Target Date  04/03/18      PT LONG TERM GOAL #3   Title  Pt. will increase L hip/knee strength to grossly 4+/5 MMT to improve pain-free mobility/ return to work without restriction.      Baseline  R LE strength: 5/5 MMT quad/ hamstring. 4+/5 hip flexion. 5/5 ankle. L LE strength grossly 4/5 MMT    Time  6    Period  Weeks    Status  New    Target Date  04/03/18      PT LONG TERM GOAL #4   Title  Pt. able to ambulate  with normalized gait pattern and no assistive device community distances safely.      Baseline  Slight L antalgic gait with use of SPC    Time  6    Period  Weeks    Status  New    Target Date  04/03/18      PT LONG TERM GOAL #5   Title  Pt. able to return to work at [employer] with no L knee pain or limitations.      Baseline  pt. working [employer] at this time.     Time  6    Period  Weeks    Status  New    Target Date  04/03/18        Patient will benefit from skilled therapeutic intervention in order to improve the following deficits and impairments:  Abnormal gait, Improper body mechanics, Pain, Decreased mobility, Decreased activity tolerance, Decreased endurance, Decreased range of motion, Decreased strength, Hypomobility, Impaired flexibility, Difficulty walking, Decreased balance  Visit Diagnosis: Acute pain of left knee  Joint stiffness of knee, left  Muscle weakness (generalized)  Gait difficulty     Problem List Patient Active Problem List   Diagnosis Date Noted  . History of shingles  04/17/2017  . Post-menopause on HRT (hormone replacement therapy) 11/05/2015  . Fasting hyperglycemia 08/10/2015  . Anxiety, generalized 03/20/2015  . Bradycardia 03/20/2015  . Chronic tension-type headache, intractable 03/20/2015  . Gastro-esophageal reflux disease without esophagitis 03/20/2015  . IBS (irritable bowel syndrome) 03/20/2015  . Menopause 03/20/2015  . Vitamin D deficiency 03/20/2015  . Vasovagal syncope 08/18/2014  . PSVT (paroxysmal supraventricular tachycardia) (HCC) 04/12/2012  . External hemorrhoids 07/15/2007   Cammie McgeeMichael C Zoriah Pulice, PT, DPT # [phone] 02/28/2018, 1:19 PM  [place] [facility] [address] [place], [place], [address] Phone: [phone]   Fax:  [phone]  Name: Thomasene LotVanessa M Dunkel MRN: [id number] Date of Birth: 11/12/1960

## 2018-02-28 NOTE — Therapy (Signed)
[facility] [address] [place], [place], [address] Phone: [phone]   Fax:  [phone]  Physical Therapy Treatment  Patient Details  Name: Chelsea Decker MRN: [id number] Date of Birth: [birth year] Referring Provider: Dr. Odis Luster   Encounter Date: 02/28/2018  PT End of Session - 02/28/18 1456    Visit Number  3    Number of Visits  12    Date for PT Re-Evaluation  04/03/18    PT Start Time  1456    PT Stop Time  1551    PT Time Calculation (min)  55 min    Equipment Utilized During Treatment  Other (comment)    Activity Tolerance  Patient tolerated treatment well    Behavior During Therapy  [facility] for tasks assessed/performed       Past Medical History:  Diagnosis Date  . Anxiety   . Asthma   . External hemorrhoids without mention of complication   . GERD (gastroesophageal reflux disease)   . Heart palpitations   . IBS (irritable bowel syndrome)   . Lump or mass in breast   . Palpitations   . PSVT (paroxysmal supraventricular tachycardia) (HCC) 04/12/2012  . Unspecified vitamin D deficiency     Past Surgical History:  Procedure Laterality Date  . ABDOMINAL HYSTERECTOMY     2013  . BREAST BIOPSY Left 2013   core w/clip - neg  . LAPAROSCOPIC TOTAL HYSTERECTOMY      There were no vitals filed for this visit.  Subjective Assessment - 02/28/18 1454    Subjective  Pt. reports no L knee pain currently at rest but reports 6/10 L knee pain at work this morning.  Pt. entered PT without use of knee brace and with use of SPC on R.      Pertinent History  Pt. fell while walking into residents house at [employer].  R foot slipped and pt. fell on L knee/elbow.  Pt. is currently on Light Duty status until 03/19/18.  Pt. using w/c at work and doing seated work.  Pt. arrived to PT with use of axillary crutches.      Limitations  Lifting;Standing;Walking;House hold activities    Patient Stated Goals  return to work with  no L knee limitations.  Amb. with no issues.      Currently in Pain?  No/denies          Treatment:   There.ex.: Scifit L6 10 min. B UE/LE (no charge/ warm-up)- consistent knee flexion on L at seat #5. Recip. Stair climbing with use of R UE only on handrail 10x. TG knee flexion 40x/ heel raises with added gastroc stretch 20x. BOSU step ups on R/L with hip flexion in //-bars (light UE assist/ mirror feedback).  Tandem gait in //-bars with no UE assist 2x (no LOB) L SLS >30 sec. With no UE assist.  Supine hip abduction with RTB 20x/ hip flexion with RTB 20x each Seated hamstring curls with RTB 30x L/R.  Amb. In hallway with and without use of SPC (min. Cuing for proper 2-point gait pattern/ step pattern)- increase cadence today.   Manual tx. Supine L hip/knee stretches (12 min.).  L knee AROM: 136 deg. After stretches (marked increase since initial evaluation).   Supine L hip/knee manual contract-relax technique (no pain)- 5x.   Reviewed HEP.       PT Long Term Goals - 02/23/18 1021      PT LONG TERM GOAL #1  Title  Pt. will increase FOTO to 49 to improve pain-free mobility/ return to work.      Baseline  FOTO: baseline 26.      Time  6    Period  Weeks    Status  New    Target Date  04/03/18      PT LONG TERM GOAL #2   Title  Pt. will increase L knee AROM to >130 deg. to improve pain-free mobility/ walking.     Baseline  L knee AROM: 0 to 116 deg.    Time  6    Period  Weeks    Status  New    Target Date  04/03/18      PT LONG TERM GOAL #3   Title  Pt. will increase L hip/knee strength to grossly 4+/5 MMT to improve pain-free mobility/ return to work without restriction.      Baseline  R LE strength: 5/5 MMT quad/ hamstring. 4+/5 hip flexion. 5/5 ankle. L LE strength grossly 4/5 MMT    Time  6    Period  Weeks    Status  New    Target Date  04/03/18      PT LONG TERM GOAL #4   Title  Pt. able to ambulate with normalized gait pattern and no assistive device  community distances safely.      Baseline  Slight L antalgic gait with use of SPC    Time  6    Period  Weeks    Status  New    Target Date  04/03/18      PT LONG TERM GOAL #5   Title  Pt. able to return to work at [employer] with no L knee pain or limitations.      Baseline  pt. working Light Duty at this time.     Time  6    Period  Weeks    Status  New    Target Date  04/03/18          Plan - 02/28/18 1456    Clinical Impression Statement  Pt. is slowly improving technique with 2-point gait pattern and SPC use.  PT is [facility] to promote a more normalized gait pattern.  L knee AROM flexion in supine is 136 deg. with no c/o pain.  Good patellar mobility all planes with no tenderness reported.  Pt. will continue with quad strengthening/ hip ex. program at this time.      Clinical Presentation  Stable    Clinical Decision Making  Low    Rehab Potential  Excellent    PT Frequency  2x / week    PT Duration  6 weeks    PT Treatment/Interventions  ADLs/Self Care Home Management;Aquatic Therapy;Cryotherapy;Electrical Stimulation;Moist Heat;Balance training;Therapeutic exercise;Therapeutic activities;Functional mobility training;Stair training;Gait training;Neuromuscular re-education;Patient/family education;Manual techniques;Passive range of motion;Dry needling;Taping    PT Next Visit Plan  Progress quad strengthening/ reassess gait without use of SPC.      PT Home Exercise Plan  See HEP       Patient will benefit from skilled therapeutic intervention in order to improve the following deficits and impairments:  Abnormal gait, Improper body mechanics, Pain, Decreased mobility, Decreased activity tolerance, Decreased endurance, Decreased range of motion, Decreased strength, Hypomobility, Impaired flexibility, Difficulty walking, Decreased balance  Visit Diagnosis: Acute pain of left knee  Joint stiffness of knee, left  Muscle  weakness (generalized)  Gait difficulty  Problem List Patient Active Problem List   Diagnosis Date Noted  . History of shingles 04/17/2017  . Post-menopause on HRT (hormone replacement therapy) 11/05/2015  . Fasting hyperglycemia 08/10/2015  . Anxiety, generalized 03/20/2015  . Bradycardia 03/20/2015  . Chronic tension-type headache, intractable 03/20/2015  . Gastro-esophageal reflux disease without esophagitis 03/20/2015  . IBS (irritable bowel syndrome) 03/20/2015  . Menopause 03/20/2015  . Vitamin D deficiency 03/20/2015  . Vasovagal syncope 08/18/2014  . PSVT (paroxysmal supraventricular tachycardia) (HCC) 04/12/2012  . External hemorrhoids 07/15/2007   [name], PT, DPT # [id number] 02/28/2018, 5:21 PM  [facility] [address] [place], [place], [address] Phone: [phone]   Fax:  [phone]  Name: Chelsea Decker MRN: [id number] Date of Birth: Sep 29, 1960

## 2018-03-05 ENCOUNTER — Encounter: Payer: Self-pay | Admitting: Physical Therapy

## 2018-03-05 ENCOUNTER — Ambulatory Visit: Payer: PRIVATE HEALTH INSURANCE

## 2018-03-05 DIAGNOSIS — M25562 Pain in left knee: Secondary | ICD-10-CM

## 2018-03-05 DIAGNOSIS — M6281 Muscle weakness (generalized): Secondary | ICD-10-CM

## 2018-03-05 DIAGNOSIS — R269 Unspecified abnormalities of gait and mobility: Secondary | ICD-10-CM

## 2018-03-05 DIAGNOSIS — M25662 Stiffness of left knee, not elsewhere classified: Secondary | ICD-10-CM

## 2018-03-06 NOTE — Therapy (Cosign Needed)
[facility] [address] [place], [place], [address] Phone: [phone]   Fax:  [phone]  Physical Therapy Treatment  Patient Details  Name: Chelsea Decker MRN: [id number] Date of Birth: 02/05/1961 Referring Provider: Dr. Odis LusterBowers   Encounter Date: 03/05/2018  PT End of Session - 03/06/18 1204    Visit Number  4    Number of Visits  12    Date for PT Re-Evaluation  04/03/18    PT Start Time  1501    PT Stop Time  1548    PT Time Calculation (min)  47 min    Equipment Utilized During Treatment  Other (comment)    Activity Tolerance  Patient tolerated treatment well    Behavior During Therapy  [facility] for tasks assessed/performed       Past Medical History:  Diagnosis Date  . Anxiety   . Asthma   . External hemorrhoids without mention of complication   . GERD (gastroesophageal reflux disease)   . Heart palpitations   . IBS (irritable bowel syndrome)   . Lump or mass in breast   . Palpitations   . PSVT (paroxysmal supraventricular tachycardia) (HCC) 04/12/2012  . Unspecified vitamin D deficiency     Past Surgical History:  Procedure Laterality Date  . ABDOMINAL HYSTERECTOMY     2013  . BREAST BIOPSY Left 2013   core w/clip - neg  . LAPAROSCOPIC TOTAL HYSTERECTOMY      There were no vitals filed for this visit.  Subjective Assessment - 03/05/18 1520    Subjective  Pt. reports no L knee pain upon arrival to clinic.  Pt. reports he has not experienced much discomfort in L knee since last visit.      Pertinent History  Pt. fell while [facility] [employer] [facility] [facility] [employer].  R foot slipped and pt. fell on L knee/elbow.  Pt. is currently on Light Duty status until 03/19/18.  Pt. using w/c at work and doing seated work.  Pt. arrived to PT with use of axillary crutches.      Limitations  Lifting;Standing;Walking;House hold activities    Patient Stated Goals  return to work with no L knee limitations.  Amb. with  no issues.      Currently in Pain?  No/denies    Pain Score  0-No pain          TREATMENT  There.ex.: Scifit L4 10 min. B UE/LE (no charge/ warm-up)- consistent knee flexion on L at seat #5. Recip. Stair climbing with use of R UE only on handrail 10x. TG squats 2x20/ heel raises with added gastroc stretch 20x BOSU step ups on R/L with hip flexion in //-bars (light UE assist/ mirror feedback) BOSU balance 2x30 sec in // bars (light UE assist/mirror feedback Tandem gait in //-bars with no UE assist 4x each frontward/backward (no LOB) L SLS >30 sec. with no UE assist.  Supine hip abduction with RTB 20x/ hip flexion with RTB 20x each     Manual tx. Supine L hip/knee stretches (17 min.).  L knee AROM: 142 deg. After stretches (6 deg increase since last session)      PT Education - 03/06/18 1634    Education Details  exercise form/technique    Person(s) Educated  Patient    Methods  Explanation    Comprehension  Verbalized understanding          PT Long Term Goals - 02/23/18 1021  PT LONG TERM GOAL #1   Title  Pt. will increase FOTO to 49 to improve pain-free mobility/ return to work.      Baseline  FOTO: baseline 26.      Time  6    Period  Weeks    Status  New    Target Date  04/03/18      PT LONG TERM GOAL #2   Title  Pt. will increase L knee AROM to >130 deg. to improve pain-free mobility/ walking.     Baseline  L knee AROM: 0 to 116 deg.    Time  6    Period  Weeks    Status  New    Target Date  04/03/18      PT LONG TERM GOAL #3   Title  Pt. will increase L hip/knee strength to grossly 4+/5 MMT to improve pain-free mobility/ return to work without restriction.      Baseline  R LE strength: 5/5 MMT quad/ hamstring. 4+/5 hip flexion. 5/5 ankle. L LE strength grossly 4/5 MMT    Time  6    Period  Weeks    Status  New    Target Date  04/03/18      PT LONG TERM GOAL #4   Title  Pt. able to ambulate with normalized gait pattern and no assistive device  community distances safely.      Baseline  Slight L antalgic gait with use of SPC    Time  6    Period  Weeks    Status  New    Target Date  04/03/18      PT LONG TERM GOAL #5   Title  Pt. able to return to work at [employer] with no L knee pain or limitations.      Baseline  pt. working Light Duty at this time.     Time  6    Period  Weeks    Status  New    Target Date  04/03/18            Plan - 03/06/18 1205    Clinical Impression Statement  Pt. progressing well with gait using SPC, and is attempting to start walking without use of cane.  Pt. will continue to strengthen quad and hip in future sessions in order to promote a more normalized gait pattern and stability needed for 2-point gait pattern.    Clinical Presentation  Stable    Clinical Decision Making  Low    Rehab Potential  Excellent    PT Frequency  2x / week    PT Duration  6 weeks    PT Treatment/Interventions  ADLs/Self Care Home Management;Aquatic Therapy;Cryotherapy;Electrical Stimulation;Moist Heat;Balance training;Therapeutic exercise;Therapeutic activities;Functional mobility training;Stair training;Gait training;Neuromuscular re-education;Patient/family education;Manual techniques;Passive range of motion;Dry needling;Taping    PT Next Visit Plan  Progress quad strengthening/ reassess gait without use of SPC.    PT Home Exercise Plan  See HEP       Patient will benefit from skilled therapeutic intervention in order to improve the following deficits and impairments:  Abnormal gait, Improper body mechanics, Pain, Decreased mobility, Decreased activity tolerance, Decreased endurance, Decreased range of motion, Decreased strength, Hypomobility, Impaired flexibility, Difficulty walking, Decreased balance  Visit Diagnosis: Acute pain of left knee  Joint stiffness of knee, left  Muscle weakness (generalized)  Gait difficulty     Problem List Patient Active Problem List   Diagnosis Date Noted   . History of shingles  04/17/2017  . Post-menopause on HRT (hormone replacement therapy) 11/05/2015  . Fasting hyperglycemia 08/10/2015  . Anxiety, generalized 03/20/2015  . Bradycardia 03/20/2015  . Chronic tension-type headache, intractable 03/20/2015  . Gastro-esophageal reflux disease without esophagitis 03/20/2015  . IBS (irritable bowel syndrome) 03/20/2015  . Menopause 03/20/2015  . Vitamin D deficiency 03/20/2015  . Vasovagal syncope 08/18/2014  . PSVT (paroxysmal supraventricular tachycardia) (HCC) 04/12/2012  . External hemorrhoids 07/15/2007   This entire session was performed under direct supervision and direction of a licensed therapist/therapist assistant . I have personally read, edited and approve of the note as written.   Sharalyn Ink Huprich PT, DPT, GCS  Huprich,Jason 03/06/2018, 4:38 PM  [facility] [address] [place], [place], [address] Phone: [phone]   Fax:  [phone]  Name: Chelsea Decker MRN: [id number] Date of Birth: [birth year]

## 2018-03-07 ENCOUNTER — Ambulatory Visit: Payer: PRIVATE HEALTH INSURANCE

## 2018-03-07 DIAGNOSIS — M6281 Muscle weakness (generalized): Secondary | ICD-10-CM

## 2018-03-07 DIAGNOSIS — R269 Unspecified abnormalities of gait and mobility: Secondary | ICD-10-CM

## 2018-03-07 DIAGNOSIS — M25562 Pain in left knee: Secondary | ICD-10-CM

## 2018-03-07 DIAGNOSIS — M25662 Stiffness of left knee, not elsewhere classified: Secondary | ICD-10-CM

## 2018-03-07 NOTE — Therapy (Cosign Needed)
[facility] [address] [place], [place], [address] Phone: [phone]   Fax:  [phone]  Physical Therapy Treatment  Patient Details  Name: Chelsea Decker MRN: [id number] Date of Birth: Dec 21, 1960 Referring Provider: Dr. Odis Luster  Encounter Date: 03/07/2018  PT End of Session - 03/07/18 1512    Visit Number  5    Number of Visits  12    Date for PT Re-Evaluation  04/03/18    PT Start Time  1501    Equipment Utilized During Treatment  Other (comment)    Activity Tolerance  Patient tolerated treatment well    Behavior During Therapy  [facility] for tasks assessed/performed       Past Medical History:  Diagnosis Date  . Anxiety   . Asthma   . External hemorrhoids without mention of complication   . GERD (gastroesophageal reflux disease)   . Heart palpitations   . IBS (irritable bowel syndrome)   . Lump or mass in breast   . Palpitations   . PSVT (paroxysmal supraventricular tachycardia) (HCC) 04/12/2012  . Unspecified vitamin D deficiency     Past Surgical History:  Procedure Laterality Date  . ABDOMINAL HYSTERECTOMY     2013  . BREAST BIOPSY Left 2013   core w/clip - neg  . LAPAROSCOPIC TOTAL HYSTERECTOMY      There were no vitals filed for this visit.  Subjective Assessment - 03/07/18 1510    Subjective  Pt. reports 5/10 pain in L knee from sitting too long today.  Pt. reports it feels much stiffer than normal today due to sitting in wheelchair for long period of time at work.    Pertinent History  Pt. fell while walking into residents house at [employer].  R foot slipped and pt. fell on L knee/elbow.  Pt. is currently on Light Duty status until 03/19/18.  Pt. using w/c at work and doing seated work.  Pt. arrived to PT with use of axillary crutches.      Limitations  Lifting;Standing;Walking;House hold activities    Patient Stated Goals  return to work with no L knee limitations.  Amb. with no issues.      Currently in Pain?  Yes    Pain Score  5     Pain Location  Knee    Pain Orientation  Left    Pain Descriptors / Indicators  Aching        TREATMENT   There.ex.: Scifit L5.5 10 min. B UE/LE (no charge/ warm-up)- consistent knee flexion on L at seat #4 Recip. Stair climbing with no UE usage 10x. TG squats 2x20/ heel raises with added gastroc stretch 20x BOSU step ups on R/L with hip flexion in //-bars (light UE assist/ mirror feedback) 5x each BOSU balance 4x30 sec in // bars (no UE assist/mirror feedback) Tandem gait in //-bars with no UE assist 4x each frontward/backward (no LOB) L SLS >60 sec. with no UE assist.  Supine hip abduction with GTB 20x/ hip flexion with GTB 15x each     Manual tx. Supine L hip/knee stretches (16 min.).  L knee AROM: 146 deg. After stretches     PT Education - 03/06/18 1634    Education Details  exercise form/technique    Person(s) Educated  Patient    Methods  Explanation    Comprehension  Verbalized understanding          PT Long Term Goals - 02/23/18 1021  PT LONG TERM GOAL #1   Title  Pt. will increase FOTO to 49 to improve pain-free mobility/ return to work.      Baseline  FOTO: baseline 26.      Time  6    Period  Weeks    Status  New    Target Date  04/03/18      PT LONG TERM GOAL #2   Title  Pt. will increase L knee AROM to >130 deg. to improve pain-free mobility/ walking.     Baseline  L knee AROM: 0 to 116 deg.    Time  6    Period  Weeks    Status  New    Target Date  04/03/18      PT LONG TERM GOAL #3   Title  Pt. will increase L hip/knee strength to grossly 4+/5 MMT to improve pain-free mobility/ return to work without restriction.      Baseline  R LE strength: 5/5 MMT quad/ hamstring. 4+/5 hip flexion. 5/5 ankle. L LE strength grossly 4/5 MMT    Time  6    Period  Weeks    Status  New    Target Date  04/03/18      PT LONG TERM GOAL #4   Title  Pt. able to ambulate with normalized gait pattern and no  assistive device community distances safely.      Baseline  Slight L antalgic gait with use of SPC    Time  6    Period  Weeks    Status  New    Target Date  04/03/18      PT LONG TERM GOAL #5   Title  Pt. able to return to work at [employer] with no L knee pain or limitations.      Baseline  pt. working Light Duty at this time.     Time  6    Period  Weeks    Status  New    Target Date  04/03/18            Plan - 03/07/18 1536    Clinical Impression Statement  Pt. is progressing with strength and is performing ther ex with GTB at this point. Pt. also able to ascend/descend stairs without need of UE or AD. Pt demonstrates more normalized gait pattern without use of SPC.  Pt. will continue to strengthen LE during future PT sessions.    Clinical Presentation  Stable    Clinical Decision Making  Low    Rehab Potential  Excellent    PT Frequency  2x / week    PT Duration  6 weeks    PT Treatment/Interventions  ADLs/Self Care Home Management;Aquatic Therapy;Cryotherapy;Electrical Stimulation;Moist Heat;Balance training;Therapeutic exercise;Therapeutic activities;Functional mobility training;Stair training;Gait training;Neuromuscular re-education;Patient/family education;Manual techniques;Passive range of motion;Dry needling;Taping    PT Next Visit Plan  Progress quad strengthening/ reassess gait without use of SPC.    PT Home Exercise Plan  See HEP       Patient will benefit from skilled therapeutic intervention in order to improve the following deficits and impairments:  Abnormal gait, Improper body mechanics, Pain, Decreased mobility, Decreased activity tolerance, Decreased endurance, Decreased range of motion, Decreased strength, Hypomobility, Impaired flexibility, Difficulty walking, Decreased balance  Visit Diagnosis: Acute pain of left knee  Joint stiffness of knee, left  Muscle weakness (generalized)  Gait difficulty     Problem List Patient Active  Problem List   Diagnosis Date Noted  .  History of shingles 04/17/2017  . Post-menopause on HRT (hormone replacement therapy) 11/05/2015  . Fasting hyperglycemia 08/10/2015  . Anxiety, generalized 03/20/2015  . Bradycardia 03/20/2015  . Chronic tension-type headache, intractable 03/20/2015  . Gastro-esophageal reflux disease without esophagitis 03/20/2015  . IBS (irritable bowel syndrome) 03/20/2015  . Menopause 03/20/2015  . Vitamin D deficiency 03/20/2015  . Vasovagal syncope 08/18/2014  . PSVT (paroxysmal supraventricular tachycardia) (HCC) 04/12/2012  . External hemorrhoids 07/15/2007    This entire session was performed under direct supervision and direction of a licensed therapist/therapist assistant . I have personally read, edited and approve of the note as written.   Nolon BussingJoshua Jaime Dome SPT Lynnea MaizesJason D Huprich PT, DPT, GCS  Huprich,Jason 03/07/2018, 4:59 PM  [facility] [address] [place], [place], [address] Phone: [phone]   Fax:  [phone]  Name: Thomasene LotVanessa M Obst MRN: [id number] Date of Birth: 15-May-1961

## 2018-03-11 ENCOUNTER — Ambulatory Visit (INDEPENDENT_AMBULATORY_CARE_PROVIDER_SITE_OTHER): Payer: 59 | Admitting: Family Medicine

## 2018-03-11 ENCOUNTER — Encounter: Payer: Self-pay | Admitting: Family Medicine

## 2018-03-11 VITALS — BP 112/72 | HR 64 | Temp 97.9°F | Resp 16 | Ht 64.0 in | Wt 138.4 lb

## 2018-03-11 DIAGNOSIS — F320 Major depressive disorder, single episode, mild: Secondary | ICD-10-CM | POA: Diagnosis not present

## 2018-03-11 DIAGNOSIS — K219 Gastro-esophageal reflux disease without esophagitis: Secondary | ICD-10-CM

## 2018-03-11 DIAGNOSIS — E559 Vitamin D deficiency, unspecified: Secondary | ICD-10-CM | POA: Diagnosis not present

## 2018-03-11 DIAGNOSIS — F411 Generalized anxiety disorder: Secondary | ICD-10-CM | POA: Diagnosis not present

## 2018-03-11 DIAGNOSIS — R7303 Prediabetes: Secondary | ICD-10-CM | POA: Diagnosis not present

## 2018-03-11 DIAGNOSIS — G44221 Chronic tension-type headache, intractable: Secondary | ICD-10-CM | POA: Diagnosis not present

## 2018-03-11 DIAGNOSIS — I471 Supraventricular tachycardia: Secondary | ICD-10-CM

## 2018-03-11 DIAGNOSIS — K581 Irritable bowel syndrome with constipation: Secondary | ICD-10-CM

## 2018-03-11 DIAGNOSIS — Z8781 Personal history of (healed) traumatic fracture: Secondary | ICD-10-CM

## 2018-03-11 NOTE — Progress Notes (Signed)
Name: Chelsea Decker   MRN: [id number]    DOB: [birth year]   Date:03/11/2018       Progress Note  Subjective  Chief Complaint  Chief Complaint  Patient presents with  . Medication Refill  . Depression  . Anxiety  . Irritable Bowel Syndrome  . Gastroesophageal Reflux    HPI  Tension Headaches: she has episodes of headaches pain is described as a throbbing or sharp sensation, and radiates to right side of neck, sometimes on left sid, at times on temporal area. No photophobia or phonophobia, or focal deficits. Lasts about one hour. Episodes of headaches are on average 2 times per week.  takes Fioricet prn and works within 10-15 minutes.  IBS with constipation: symptoms have improved with Linzess, currently not taking medication daily because bowels have been regular, she takes it prn only now.   GERD:she is back on  Pantoprazole and symptoms are controlled.   SVT: seen by Dr. Kirke Corin, stress test in 2013 showed HR went up to 220 with maximum activity, she is avoiding chocolate and caffeine .She is on Metoprolol. Last visit toto EC was in July 2018and itwas secondary to panic attack causing chest pain.She states she has a fit bit and while at rest and had palpitation, she looked down on her fitbit and HR was 150, it lasted about 10 minutes and resolved by itself. Unchanged symptoms. Compliant with medication   GAD: Aron had to take some time off work back in September 2016 because of severe anxiety associated with her work. .She states she is taking Lexapro and Alprazolam XR. Had a work related injury that cause fracture of left patella, and is currently on light duty, still under stress, conflict with supervisor that she had to go talk to HR.   Pre-diabetes: no polyphagia, polydipsia or polyuria, last hgbA1C was 5.8%. Discussed again life style modification. Currently not very active because of left patella fracture.    Patient Active Problem List   Diagnosis Date  Noted  . History of shingles 04/17/2017  . Post-menopause on HRT (hormone replacement therapy) 11/05/2015  . Fasting hyperglycemia 08/10/2015  . Anxiety, generalized 03/20/2015  . Bradycardia 03/20/2015  . Chronic tension-type headache, intractable 03/20/2015  . Gastro-esophageal reflux disease without esophagitis 03/20/2015  . IBS (irritable bowel syndrome) 03/20/2015  . Menopause 03/20/2015  . Vitamin D deficiency 03/20/2015  . Vasovagal syncope 08/18/2014  . PSVT (paroxysmal supraventricular tachycardia) (HCC) 04/12/2012  . External hemorrhoids 07/15/2007    Past Surgical History:  Procedure Laterality Date  . ABDOMINAL HYSTERECTOMY     2013  . BREAST BIOPSY Left 2013   core w/clip - neg  . LAPAROSCOPIC TOTAL HYSTERECTOMY      Family History  Problem Relation Age of Onset  . Cancer Mother   . Diabetes Mother   . Cancer Father   . Diabetes Father   . Breast cancer Neg Hx     Social History   Socioeconomic History  . Marital status: Divorced    Spouse name: Not on file  . Number of children: 2  . Years of education: Not on file  . Highest education level: Not on file  Occupational History  . 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 Financial resource strain: Not on file  . Food insecurity:    Worry: Never true    Inability: Never true  . Transportation needs:    Medical: No    Non-medical: No  Tobacco Use  .  Smoking status: Never Smoker  . Smokeless tobacco: Never Used  Substance and Sexual Activity  . Alcohol use: No    Alcohol/week: 0.0 oz  . Drug use: No  . Sexual activity: Yes  Lifestyle  . Physical activity:    Days per week: 0 days    Minutes per session: 0 min  . Stress: Only a little  Relationships  . Social connections:    Talks on phone: Not on file    Gets together: Not on file    Attends religious service: Not on file    Active member of club or organization: Not on file    Attends meetings of clubs or  organizations: Not on file    Relationship status: Not on file  . Intimate partner violence:    Fear of current or ex partner: No    Emotionally abused: No    Physically abused: No    Forced sexual activity: No  Other Topics Concern  . Not on file  Social History Narrative   Divorced   Dating Fayrene Fearing since 2015     Current Outpatient Medications:  .  acetaminophen (TYLENOL) 500 MG chewable tablet, Chew 500 mg by mouth every 6 (six) hours as needed., Disp: , Rfl:  .  ALPRAZolam (XANAX XR) 0.5 MG 24 hr tablet, Take 1 tablet (0.5 mg total) by mouth daily., Disp: 30 tablet, Rfl: 2 .  aspirin 81 MG tablet, Take 81 mg by mouth as needed. , Disp: , Rfl:  .  butalbital-acetaminophen-caffeine (FIORICET WITH CODEINE) 50-325-40-30 MG capsule, TAKE ONE CAPSULE BY MOUTH EVERY 4 HOURS AS NEEDED FOR HEADACHE, Disp: 30 capsule, Rfl: 0 .  Cholecalciferol (VITAMIN D) 2000 UNITS tablet, Take 2,000 Units by mouth daily., Disp: , Rfl:  .  escitalopram (LEXAPRO) 10 MG tablet, Take 1 tablet (10 mg total) by mouth daily., Disp: 30 tablet, Rfl: 2 .  linaclotide (LINZESS) 72 MCG capsule, Take 1 capsule (72 mcg total) by mouth daily. (Patient taking differently: Take 72 mcg by mouth daily as needed. ), Disp: 30 capsule, Rfl: 2 .  meloxicam (MOBIC) 15 MG tablet, Take 1 tablet by mouth daily., Disp: , Rfl:  .  metoprolol tartrate (LOPRESSOR) 25 MG tablet, TAKE 1 TABLET (25 MG TOTAL) BY MOUTH 2 (TWO) TIMES DAILY., Disp: 60 tablet, Rfl: 3 .  traMADol (ULTRAM) 50 MG tablet, tramadol 50 mg tablet  Take 1 tablet every 6 hours by oral route., Disp: , Rfl:   No Known Allergies   ROS  Constitutional: Negative for fever or weight change.  Respiratory: Negative for cough and shortness of breath.   Cardiovascular: Negative for chest pain or palpitations.  Gastrointestinal: Negative for abdominal pain, no bowel changes.  Musculoskeletal: positive  for gait problem - wearing a brace  Skin: Negative for rash.   Neurological: Negative for dizziness, positive for intermittent  headache.  No other specific complaints in a complete review of systems (except as listed in HPI above).  Objective  Vitals:   03/11/18 1448  BP: 112/72  Pulse: 64  Resp: 16  Temp: 97.9 F (36.6 C)  TempSrc: Oral  SpO2: 96%  Weight: 138 lb 6.4 oz (62.8 kg)  Height: 5\' 4"  (1.626 m)    Body mass index is 23.76 kg/m.  Physical Exam  Constitutional: Patient appears well-developed and well-nourished.  No distress.  HEENT: head atraumatic, normocephalic, pupils equal and reactive to light,  neck supple, throat within normal limits Cardiovascular: Normal rate, regular rhythm and normal heart  sounds.  No murmur heard. No BLE edema. Pulmonary/Chest: Effort normal and breath sounds normal. No respiratory distress. Abdominal: Soft.  There is no tenderness. Muscular Skeletal: still has a brace of left knee , using cane  Psychiatric: Patient has a normal mood and affect. behavior is normal. Judgment and thought content normal.  PHQ2/9: Depression screen [facility]/9 03/11/2018 12/10/2017 09/04/2017 04/17/2017 03/02/2017  Decreased Interest 0 2 2 0 0  Down, Depressed, Hopeless 0 2 2 0 0  PHQ - 2 Score 0 4 4 0 0  Altered sleeping 0 0 2 - -  Tired, decreased energy 2 3 3  - -  Change in appetite 0 0 2 - -  Feeling bad or failure about yourself  0 0 0 - -  Trouble concentrating 0 0 0 - -  Moving slowly or fidgety/restless 0 0 2 - -  Suicidal thoughts 0 - 0 - -  PHQ-9 Score 2 7 13  - -  Difficult doing work/chores Not difficult at all - Somewhat difficult - -     Fall Risk: Fall Risk  03/11/2018 12/10/2017 09/04/2017 06/04/2017 04/17/2017  Falls in the past year? Yes No No No No  Number falls in past yr: 1 - - - -  Injury with Fall? Yes - - - -  Comment Left Knee - - - -     Functional Status Survey: Is the patient deaf or have difficulty hearing?: No Does the patient have difficulty seeing, even when wearing glasses/contacts?:  Yes(glasses) Does the patient have difficulty concentrating, remembering, or making decisions?: No Does the patient have difficulty walking or climbing stairs?: Yes Does the patient have difficulty dressing or bathing?: No Does the patient have difficulty doing errands alone such as visiting a doctor's office or shopping?: No    Assessment & Plan  1. Mild major depression (HCC)  Doing well at this time  2. Irritable bowel syndrome with constipation  Taking Linzess prn because it causes diarrhea.   3. Chronic tension-type headache, intractable  Taking medication prn   4. Anxiety, generalized   Stable at this time, she brought alprazolam XR and does not seem to be taking daily.   5. Pre-diabetes   Last hgbA1C is stable  6. Vitamin D deficiency  Taking otc supplementation   7. PSVT (paroxysmal supraventricular tachycardia) (HCC)  Stable on beta blocker   8. Gastro-esophageal reflux disease without esophagitis  stable  9. History of patellar fracture  She is still at light duty because of patella fracture. Seeing ortho still

## 2018-03-12 ENCOUNTER — Ambulatory Visit: Payer: PRIVATE HEALTH INSURANCE | Admitting: Physical Therapy

## 2018-03-12 ENCOUNTER — Encounter: Payer: Self-pay | Admitting: Physical Therapy

## 2018-03-12 DIAGNOSIS — M25662 Stiffness of left knee, not elsewhere classified: Secondary | ICD-10-CM

## 2018-03-12 DIAGNOSIS — M25562 Pain in left knee: Secondary | ICD-10-CM

## 2018-03-12 DIAGNOSIS — R269 Unspecified abnormalities of gait and mobility: Secondary | ICD-10-CM

## 2018-03-12 DIAGNOSIS — M6281 Muscle weakness (generalized): Secondary | ICD-10-CM

## 2018-03-12 NOTE — Therapy (Signed)
[place] [facility] [address]. [place], [place], [address] Phone: [phone]   Fax:  [phone]  Physical Therapy Treatment  Patient Details  Name: Chelsea Decker MRN: [id number] Date of Birth: 25-Feb-1961 Referring Provider: Dr. Odis Luster   Encounter Date: 03/12/2018  PT End of Session - 03/12/18 1707    Visit Number  6    Number of Visits  12    Date for PT Re-Evaluation  04/03/18    PT Start Time  1459    PT Stop Time  1558    PT Time Calculation (min)  59 min    Equipment Utilized During Treatment  Other (comment)    Activity Tolerance  Patient tolerated treatment well    Behavior During Therapy  [facility] for tasks assessed/performed       Past Medical History:  Diagnosis Date  . Anxiety   . Asthma   . External hemorrhoids without mention of complication   . GERD (gastroesophageal reflux disease)   . Heart palpitations   . IBS (irritable bowel syndrome)   . Lump or mass in breast   . Palpitations   . PSVT (paroxysmal supraventricular tachycardia) (HCC) 04/12/2012  . Unspecified vitamin D deficiency     Past Surgical History:  Procedure Laterality Date  . ABDOMINAL HYSTERECTOMY     2013  . BREAST BIOPSY Left 2013   core w/clip - neg  . LAPAROSCOPIC TOTAL HYSTERECTOMY      There were no vitals filed for this visit.  Subjective Assessment - 03/12/18 1706    Subjective  Pt. reports no pain upon arrival to clinic.  Pt. reports not using cane much at work, and not using AD at all when at home.      Pertinent History  Pt. fell while walking into residents house at [facility].  R foot slipped and pt. fell on L knee/elbow.  Pt. is currently on Light Duty status until 03/19/18.  Pt. using w/c at work and doing seated work.  Pt. arrived to PT with use of axillary crutches.      Limitations  Lifting;Standing;Walking;House hold activities    Patient Stated Goals  return to work with no L knee limitations.  Amb. with no  issues.      Currently in Pain?  No/denies    Pain Score  0-No pain          TREATMENT  There.ex.: Scifit L610 min. B UE/LE (no charge/ warm-up)- consistent knee flexion on L at seat #4 Sled push/pull in hallway 45 feet x 6.   BOSU step ups on R/L with hip flexion in //-bars (light UE assist/ mirror feedback) 5x each Reverse BOSU balance 4x30 sec in // bars (no UE assist/mirror feedback)/ partial squats 20x Tandem gait in //-bars with no UE assist 4x each frontward/backward (no LOB) Supine hip abduction with GTB 20x/ hip flexion with GTB 15x each Supine knee flexion/ SLR/ quad sets with manual feedback. TG knee flexion 30x/ single leg 10x2 each. (no pain with good patellar tracking).  Ambulate in clinic with no assistive device with increase cadence.    PT Long Term Goals - 02/23/18 1021      PT LONG TERM GOAL #1   Title  Pt. will increase FOTO to 49 to improve pain-free mobility/ return to work.      Baseline  FOTO: baseline 26.      Time  6    Period  Weeks  Status  New    Target Date  04/03/18      PT LONG TERM GOAL #2   Title  Pt. will increase L knee AROM to >130 deg. to improve pain-free mobility/ walking.     Baseline  L knee AROM: 0 to 116 deg.    Time  6    Period  Weeks    Status  New    Target Date  04/03/18      PT LONG TERM GOAL #3   Title  Pt. will increase L hip/knee strength to grossly 4+/5 MMT to improve pain-free mobility/ return to work without restriction.      Baseline  R LE strength: 5/5 MMT quad/ hamstring. 4+/5 hip flexion. 5/5 ankle. L LE strength grossly 4/5 MMT    Time  6    Period  Weeks    Status  New    Target Date  04/03/18      PT LONG TERM GOAL #4   Title  Pt. able to ambulate with normalized gait pattern and no assistive device community distances safely.      Baseline  Slight L antalgic gait with use of SPC    Time  6    Period  Weeks    Status  New    Target Date  04/03/18      PT LONG TERM GOAL #5   Title  Pt. able to  return to work at [employer] with no L knee pain or limitations.      Baseline  pt. working Light Duty at this time.     Time  6    Period  Weeks    Status  New    Target Date  04/03/18         Plan - 03/12/18 1707    Clinical Impression Statement  Pt. performed well in therapy today, progressing to single leg squat on TG machine.  Pt. also demonstrates more normalized gait pattern with an increase cadence when walking without SPT in PT clinic.  Good knee flexion with repetitive floor to waist squats with 10-20# box lifts.  Pt. will continue to progress with strengthening in order to functionally be able to return to work without restriction    Clinical Presentation  Stable    Clinical Decision Making  Low    Rehab Potential  Excellent    PT Frequency  2x / week    PT Duration  6 weeks    PT Treatment/Interventions  ADLs/Self Care Home Management;Aquatic Therapy;Cryotherapy;Electrical Stimulation;Moist Heat;Balance training;Therapeutic exercise;Therapeutic activities;Functional mobility training;Stair training;Gait training;Neuromuscular re-education;Patient/family education;Manual techniques;Passive range of motion;Dry needling;Taping    PT Next Visit Plan  Extensive HEP.  Work related tasks    PT Home Exercise Plan  See HEP       Patient will benefit from skilled therapeutic intervention in order to improve the following deficits and impairments:  Abnormal gait, Improper body mechanics, Pain, Decreased mobility, Decreased activity tolerance, Decreased endurance, Decreased range of motion, Decreased strength, Hypomobility, Impaired flexibility, Difficulty walking, Decreased balance  Visit Diagnosis: Acute pain of left knee  Joint stiffness of knee, left  Muscle weakness (generalized)  Gait difficulty     Problem List Patient Active Problem List   Diagnosis Date Noted  . History of shingles 04/17/2017  . Post-menopause on HRT (hormone replacement therapy)  11/05/2015  . Fasting hyperglycemia 08/10/2015  . Anxiety, generalized 03/20/2015  . Bradycardia 03/20/2015  . Chronic tension-type headache, intractable 03/20/2015  .  Gastro-esophageal reflux disease without esophagitis 03/20/2015  . IBS (irritable bowel syndrome) 03/20/2015  . Menopause 03/20/2015  . Vitamin D deficiency 03/20/2015  . Vasovagal syncope 08/18/2014  . PSVT (paroxysmal supraventricular tachycardia) (HCC) 04/12/2012  . External hemorrhoids 07/15/2007   Cammie McgeeMichael C Shandie Bertz, PT, DPT # [id number] 03/13/2018, 5:43 PM  [place] [facility] [address] [place], [place], [address] Phone: [phone]   Fax:  [phone]  Name: Chelsea Decker MRN: [id number] Date of Birth: September 17, 1960

## 2018-03-14 ENCOUNTER — Encounter: Payer: Self-pay | Admitting: Physical Therapy

## 2018-03-14 ENCOUNTER — Ambulatory Visit: Payer: PRIVATE HEALTH INSURANCE | Attending: Orthopedic Surgery | Admitting: Physical Therapy

## 2018-03-14 DIAGNOSIS — R269 Unspecified abnormalities of gait and mobility: Secondary | ICD-10-CM | POA: Diagnosis present

## 2018-03-14 DIAGNOSIS — M6281 Muscle weakness (generalized): Secondary | ICD-10-CM | POA: Diagnosis present

## 2018-03-14 DIAGNOSIS — M25662 Stiffness of left knee, not elsewhere classified: Secondary | ICD-10-CM | POA: Diagnosis present

## 2018-03-14 DIAGNOSIS — M25562 Pain in left knee: Secondary | ICD-10-CM | POA: Diagnosis not present

## 2018-03-14 NOTE — Therapy (Deleted)
[place] [facility] [address] [place], [place], [address] Phone: [phone]   Fax:  [phone]  Physical Therapy Treatment  Patient Details  Name: Chelsea Decker MRN: [id number] Date of Birth: March 14, 1961 Referring Provider: Dr. Odis LusterBowers   Encounter Date: 03/14/2018  PT End of Session - 03/14/18 1514    Visit Number  7    Number of Visits  12    Date for PT Re-Evaluation  04/03/18    PT Start Time  1506    PT Stop Time  1601    PT Time Calculation (min)  55 min    Equipment Utilized During Treatment  Other (comment)    Activity Tolerance  Patient tolerated treatment well    Behavior During Therapy  [facility] for tasks assessed/performed       Past Medical History:  Diagnosis Date  . Anxiety   . Asthma   . External hemorrhoids without mention of complication   . GERD (gastroesophageal reflux disease)   . Heart palpitations   . IBS (irritable bowel syndrome)   . Lump or mass in breast   . Palpitations   . PSVT (paroxysmal supraventricular tachycardia) (HCC) 04/12/2012  . Unspecified vitamin D deficiency     Past Surgical History:  Procedure Laterality Date  . ABDOMINAL HYSTERECTOMY     2013  . BREAST BIOPSY Left 2013   core w/clip - neg  . LAPAROSCOPIC TOTAL HYSTERECTOMY      There were no vitals filed for this visit.  Subjective Assessment - 03/14/18 1509    Subjective  Pt. 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  Pt. reports that she will be seeing MD on Tuesday of next week (8/6).    Pertinent History  Pt. [facility] [employer] [facility] [facility] [employer].  R foot slipped and pt. fell on L knee/elbow.  Pt. is currently on Light Duty status until 03/19/18.  Pt. using w/c at work and doing seated work.  Pt. arrived to PT with use of axillary crutches.      Limitations  Lifting;Standing;Walking;House hold activities    Patient Stated Goals  return to work  with no L knee limitations.  Amb. with no issues.      Currently in Pain?  Yes    Pain Score  4     Pain Location  Knee    Pain Orientation  Left    Pain Descriptors / Indicators  Aching           TREATMENT  There.ex.: Scifit L610 min. B UE/LE (no charge/ warm-up)- consistent knee flexion on L at seat #4 Sled push/pull in hallway 45 feet x 6.   BOSU step ups on R/L with hip flexion in //-bars (light UE assist/ mirror feedback)5x each Reverse BOSU balance4x30 sec in // bars (no UEassist/mirror feedback)/ partial squats 20x Tandem gait in //-bars with no UE assist 4x each frontward/backward (no LOB) Supine hip abduction withGTB 20x/ hip flexion withGTB15x each Supine knee flexion/ SLR/ quad sets with manual feedback. TG knee flexion 30x/ single leg 10x2 each. (no pain with good patellar tracking).  Ambulate in clinic with no assistive device with increase cadence.      PT Long Term Goals - 02/23/18 1021      PT LONG TERM GOAL #1   Title  Pt. will increase FOTO to 49 to improve pain-free mobility/ return to work.  Baseline  FOTO: baseline 26.      Time  6    Period  Weeks    Status  New    Target Date  04/03/18      PT LONG TERM GOAL #2   Title  Pt. will increase L knee AROM to >130 deg. to improve pain-free mobility/ walking.     Baseline  L knee AROM: 0 to 116 deg.    Time  6    Period  Weeks    Status  New    Target Date  04/03/18      PT LONG TERM GOAL #3   Title  Pt. will increase L hip/knee strength to grossly 4+/5 MMT to improve pain-free mobility/ return to work without restriction.      Baseline  R LE strength: 5/5 MMT quad/ hamstring. 4+/5 hip flexion. 5/5 ankle. L LE strength grossly 4/5 MMT    Time  6    Period  Weeks    Status  New    Target Date  04/03/18      PT LONG TERM GOAL #4   Title  Pt. able to ambulate with normalized gait pattern and no assistive device community distances safely.      Baseline  Slight L antalgic gait with use  of SPC    Time  6    Period  Weeks    Status  New    Target Date  04/03/18      PT LONG TERM GOAL #5   Title  Pt. able to return to work at [employer] with no L knee pain or limitations.      Baseline  pt. working Light Duty at this time.     Time  6    Period  Weeks    Status  New    Target Date  04/03/18              Patient will benefit from skilled therapeutic intervention in order to improve the following deficits and impairments:     Visit Diagnosis: Acute pain of left knee  Joint stiffness of knee, left  Muscle weakness (generalized)  Gait difficulty     Problem List Patient Active Problem List   Diagnosis Date Noted  . History of shingles 04/17/2017  . Post-menopause on HRT (hormone replacement therapy) 11/05/2015  . Fasting hyperglycemia 08/10/2015  . Anxiety, generalized 03/20/2015  . Bradycardia 03/20/2015  . Chronic tension-type headache, intractable 03/20/2015  . Gastro-esophageal reflux disease without esophagitis 03/20/2015  . IBS (irritable bowel syndrome) 03/20/2015  . Menopause 03/20/2015  . Vitamin D deficiency 03/20/2015  . Vasovagal syncope 08/18/2014  . PSVT (paroxysmal supraventricular tachycardia) (HCC) 04/12/2012  . External hemorrhoids 07/15/2007    Chelsea Decker 03/14/2018, 3:35 PM  [place] [facility] [address] [place], [place], [address] Phone: [phone]   Fax:  [phone]  Name: Chelsea Decker MRN: [id number] Date of Birth: [birth year]

## 2018-03-14 NOTE — Therapy (Signed)
[facility] [address] [place], [place], [address] Phone: [phone]   Fax:  [phone]  Physical Therapy Treatment  Patient Details  Name: Chelsea Decker MRN: [id number] Date of Birth: [birth year] Referring Provider: Dr. Odis Luster   Encounter Date: 03/14/2018  PT End of Session - 03/14/18 1514    Visit Number  7    Number of Visits  12    Date for PT Re-Evaluation  04/03/18    PT Start Time  1506    PT Stop Time  1601    PT Time Calculation (min)  55 min    Equipment Utilized During Treatment  Other (comment)    Activity Tolerance  Patient tolerated treatment well    Behavior During Therapy  [facility] for tasks assessed/performed       Past Medical History:  Diagnosis Date  . Anxiety   . Asthma   . External hemorrhoids without mention of complication   . GERD (gastroesophageal reflux disease)   . Heart palpitations   . IBS (irritable bowel syndrome)   . Lump or mass in breast   . Palpitations   . PSVT (paroxysmal supraventricular tachycardia) (HCC) 04/12/2012  . Unspecified vitamin D deficiency     Past Surgical History:  Procedure Laterality Date  . ABDOMINAL HYSTERECTOMY     2013  . BREAST BIOPSY Left 2013   core w/clip - neg  . LAPAROSCOPIC TOTAL HYSTERECTOMY      There were no vitals filed for this visit.  Subjective Assessment - 03/14/18 1509    Subjective  Pt. 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  Pt. reports that she will be seeing MD on Tuesday of next week (8/6).    Pertinent History  Pt. fell while walking into residents house at [facility].  R foot slipped and pt. fell on L knee/elbow.  Pt. is currently on Light Duty status until 03/19/18.  Pt. using w/c at work and doing seated work.  Pt. arrived to PT with use of axillary crutches.      Limitations  Lifting;Standing;Walking;House hold activities    Patient Stated Goals  return to work  with no L knee limitations.  Amb. with no issues.      Currently in Pain?  Yes    Pain Score  4     Pain Location  Knee    Pain Orientation  Left    Pain Descriptors / Indicators  Aching            TREATMENT  Ther ex: Scifit L6.510 min. B UE/LE (no charge/ warm-up)- consistent knee flexion on L at seat #5 Nautilus resisted gait: 95# forward/ backwards.  70# lateral walking L/R.   Sled push/pull (70#) in hallway 45 feet x 6.  Floor to waist box lifting/ B carrying (25#).  Overhead 2nd shelf lift 15# 3x.    BOSU step ups on R/L with hip flexion in //-bars (light UE assist/ mirror feedback)5x each Tandem gait in //-bars (Airex) with no UE assist 4x each frontward/backward (no LOB) TG knee flexion 30x (midline/ toe in/ toe out)/ single leg 15x2 each.   Ambulate in clinic with no assistive device with increase cadence.  Reviewed HEP  No manual tx. Today.        PT Long Term Goals - 03/14/18 1607      PT LONG TERM GOAL #1   Title  Pt. will increase FOTO  to 49 to improve pain-free mobility/ return to work.      Baseline  FOTO: baseline 26.  Today: 69 (significant improvement).      Time  6    Period  Weeks    Status  Achieved    Target Date  03/14/18      PT LONG TERM GOAL #2   Title  Pt. will increase L knee AROM to >130 deg. to improve pain-free mobility/ walking.     Baseline  L knee AROM: 0 to >140 deg.      Time  6    Period  Weeks    Status  Achieved    Target Date  03/14/18      PT LONG TERM GOAL #3   Title  Pt. will increase L hip/knee strength to grossly 4+/5 MMT to improve pain-free mobility/ return to work without restriction.      Baseline  R LE strength: 5/5 MMT quad/ hamstring. 4+/5 hip flexion. 5/5 ankle. L LE strength grossly 4+/5 MMT (no pain)    Time  6    Period  Weeks    Status  Achieved    Target Date  03/14/18      PT LONG TERM GOAL #4   Title  Pt. able to ambulate with normalized gait pattern and no assistive device community distances  safely.      Baseline  marked improvement in normalized gait with no assistive device (cuing to increase cadence)    Time  6    Period  Weeks    Status  Achieved    Target Date  03/14/18      PT LONG TERM GOAL #5   Title  Pt. able to return to work at [employer] with no L knee pain or limitations.      Baseline  pt. working Light Duty at this time.  Pt. returns to MD on 8/6 to determine if able to RTW without restriction.      Time  6    Period  Weeks    Status  On-going    Target Date  04/03/18            Plan - 03/14/18 1649    Clinical Impression Statement  Pt. continues to impress with L LE progress and strengthening.  Marked increase in FOTO since initial evaluation.  Pt. ambulates with normalized gait pattern and consistent L knee flexion/ heel strike with no increase c/o pain.  Pt. reports minimal pain prior to tx. session and no pain during standing ther.ex./ functional mobility tasks.  Pt. will be able to complete work-related tasks at [employer] with no limitations safely.      Clinical Presentation  Stable    Clinical Decision Making  Low    Rehab Potential  Excellent    PT Frequency  2x / week    PT Duration  6 weeks    PT Treatment/Interventions  ADLs/Self Care Home Management;Aquatic Therapy;Cryotherapy;Electrical Stimulation;Moist Heat;Balance training;Therapeutic exercise;Therapeutic activities;Functional mobility training;Stair training;Gait training;Neuromuscular re-education;Patient/family education;Manual techniques;Passive range of motion;Dry needling;Taping    PT Next Visit Plan  Discuss MD appt.   Probable return to work with no limitations.  Discharge next visit with no issues.      PT Home Exercise Plan  See HEP    Consulted and Agree with Plan of Care  Patient       Patient will benefit from skilled therapeutic intervention in order to improve the following deficits and  impairments:  Abnormal gait, Improper body mechanics, Pain,  Decreased mobility, Decreased activity tolerance, Decreased endurance, Decreased range of motion, Decreased strength, Hypomobility, Impaired flexibility, Difficulty walking, Decreased balance  Visit Diagnosis: Acute pain of left knee  Joint stiffness of knee, left  Muscle weakness (generalized)  Gait difficulty     Problem List Patient Active Problem List   Diagnosis Date Noted  . History of shingles 04/17/2017  . Post-menopause on HRT (hormone replacement therapy) 11/05/2015  . Fasting hyperglycemia 08/10/2015  . Anxiety, generalized 03/20/2015  . Bradycardia 03/20/2015  . Chronic tension-type headache, intractable 03/20/2015  . Gastro-esophageal reflux disease without esophagitis 03/20/2015  . IBS (irritable bowel syndrome) 03/20/2015  . Menopause 03/20/2015  . Vitamin D deficiency 03/20/2015  . Vasovagal syncope 08/18/2014  . PSVT (paroxysmal supraventricular tachycardia) (HCC) 04/12/2012  . External hemorrhoids 07/15/2007   [name], PT, DPT # [id number] Tomasa Hose, SPT 03/14/2018, 4:58 PM  [facility] [address] [place], [place], [address] Phone: [phone]   Fax:  [phone]  Name: Chelsea Decker MRN: [id number] Date of Birth: 1961/05/22

## 2018-03-19 ENCOUNTER — Ambulatory Visit: Payer: PRIVATE HEALTH INSURANCE | Admitting: Physical Therapy

## 2018-03-19 ENCOUNTER — Encounter: Payer: Self-pay | Admitting: Physical Therapy

## 2018-03-19 DIAGNOSIS — M25662 Stiffness of left knee, not elsewhere classified: Secondary | ICD-10-CM

## 2018-03-19 DIAGNOSIS — M6281 Muscle weakness (generalized): Secondary | ICD-10-CM

## 2018-03-19 DIAGNOSIS — R269 Unspecified abnormalities of gait and mobility: Secondary | ICD-10-CM

## 2018-03-19 DIAGNOSIS — M25562 Pain in left knee: Secondary | ICD-10-CM

## 2018-03-19 NOTE — Therapy (Signed)
[facility] [address] [place], [place], [address] Phone: [phone]   Fax:  [phone]  Physical Therapy Treatment  Patient Details  Name: Chelsea Decker MRN: [id number] Date of Birth: [birth year] Referring Provider: Dr. Odis Luster   Encounter Date: 03/19/2018  PT End of Session - 03/19/18 1520    Visit Number  8    Number of Visits  12    Date for PT Re-Evaluation  04/03/18    Authorization Type  1 of 8    PT Start Time  1508    PT Stop Time  1605    PT Time Calculation (min)  57 min    Activity Tolerance  Patient tolerated treatment well    Behavior During Therapy  [facility] for tasks assessed/performed       Past Medical History:  Diagnosis Date  . Anxiety   . Asthma   . External hemorrhoids without mention of complication   . GERD (gastroesophageal reflux disease)   . Heart palpitations   . IBS (irritable bowel syndrome)   . Lump or mass in breast   . Palpitations   . PSVT (paroxysmal supraventricular tachycardia) (HCC) 04/12/2012  . Unspecified vitamin D deficiency     Past Surgical History:  Procedure Laterality Date  . ABDOMINAL HYSTERECTOMY     2013  . BREAST BIOPSY Left 2013   core w/clip - neg  . LAPAROSCOPIC TOTAL HYSTERECTOMY      There were no vitals filed for this visit.  Subjective Assessment - 03/19/18 1512    Subjective  Pt. states MD appt. went well and has new order of PT for 8 more tx. sessions.  Pt. will continue with Light Duty status with work related tasks.  Pt. will continue to wean off the L knee brace/ SPC as tolerated.      Pertinent History  Pt. fell while walking into residents house at [facility].  R foot slipped and pt. fell on L knee/elbow.  Pt. is currently on Light Duty status until 03/19/18.  Pt. using w/c at work and doing seated work.  Pt. arrived to PT with use of axillary crutches.      Limitations  Lifting;Standing;Walking;House hold activities    Patient Stated Goals   return to work with no L knee limitations.  Amb. with no issues.      Currently in Pain?  Yes    Pain Score  5     Pain Location  Knee    Pain Orientation  Left    Pain Descriptors / Indicators  Aching          TREATMENT  Ther ex: Scifit L6-710 min. B UE/LE (no charge/ warm-up)- consistent knee flexion on L at seat #5 TG knee flexion 20x (midline with ball/ toe in/ toe out)/ single leg 15x2 each. Nautilus resisted gait: 95# forward/ backwards.  70# lateral walking L/R.   Sled push/pull (70#) in hallway 45 feet x 6. Walking lunges in hallway/ high marching/ tandem gait (no UE assist).  BOSU step ups on R/L with hip flexion in //-bars (light UE assist/ mirror feedback)5x each Walking outside on varying terrain.  No assistive device.   Supine quad sets with manual feedback/ increase hold time.    Ambulate in clinic with no assistive device with increase cadence. Reviewed HEP            Pt. will continue to benefit from L quad/ hip strengthening to improve normalized gait  pattern/ return to work. Pt. reports her employers still has her using w/c and PT recommends pt. return to walking around work facility as tolerated to promote strengthening.    PT Long Term Goals - 03/14/18 1607      PT LONG TERM GOAL #1   Title  Pt. will increase FOTO to 49 to improve pain-free mobility/ return to work.      Baseline  FOTO: baseline 26.  Today: 69 (significant improvement).      Time  6    Period  Weeks    Status  Achieved    Target Date  03/14/18      PT LONG TERM GOAL #2   Title  Pt. will increase L knee AROM to >130 deg. to improve pain-free mobility/ walking.     Baseline  L knee AROM: 0 to >140 deg.      Time  6    Period  Weeks    Status  Achieved    Target Date  03/14/18      PT LONG TERM GOAL #3   Title  Pt. will increase L hip/knee strength to grossly 4+/5 MMT to improve pain-free mobility/ return to work without restriction.      Baseline  R LE strength: 5/5  MMT quad/ hamstring. 4+/5 hip flexion. 5/5 ankle. L LE strength grossly 4+/5 MMT (no pain)    Time  6    Period  Weeks    Status  Achieved    Target Date  03/14/18      PT LONG TERM GOAL #4   Title  Pt. able to ambulate with normalized gait pattern and no assistive device community distances safely.      Baseline  marked improvement in normalized gait with no assistive device (cuing to increase cadence)    Time  6    Period  Weeks    Status  Achieved    Target Date  03/14/18      PT LONG TERM GOAL #5   Title  Pt. able to return to work at [employer] with no L knee pain or limitations.      Baseline  pt. working Light Duty at this time.  Pt. returns to MD on 8/6 to determine if able to RTW without restriction.      Time  6    Period  Weeks    Status  On-going    Target Date  04/03/18          Plan - 03/19/18 1521    Clinical Presentation  Stable    Clinical Decision Making  Low    Rehab Potential  Excellent    PT Frequency  2x / week    PT Duration  6 weeks    PT Treatment/Interventions  ADLs/Self Care Home Management;Aquatic Therapy;Cryotherapy;Electrical Stimulation;Moist Heat;Balance training;Therapeutic exercise;Therapeutic activities;Functional mobility training;Stair training;Gait training;Neuromuscular re-education;Patient/family education;Manual techniques;Passive range of motion;Dry needling;Taping    PT Next Visit Plan  Progress L quad strengthening/ return to work with no restriction.      PT Home Exercise Plan  See HEP    Consulted and Agree with Plan of Care  Patient       Patient will benefit from skilled therapeutic intervention in order to improve the following deficits and impairments:  Abnormal gait, Improper body mechanics, Pain, Decreased mobility, Decreased activity tolerance, Decreased endurance, Decreased range of motion, Decreased strength, Hypomobility, Impaired flexibility, Difficulty walking, Decreased balance  Visit Diagnosis: Acute pain  of left  knee  Joint stiffness of knee, left  Muscle weakness (generalized)  Gait difficulty     Problem List Patient Active Problem List   Diagnosis Date Noted  . History of shingles 04/17/2017  . Post-menopause on HRT (hormone replacement therapy) 11/05/2015  . Fasting hyperglycemia 08/10/2015  . Anxiety, generalized 03/20/2015  . Bradycardia 03/20/2015  . Chronic tension-type headache, intractable 03/20/2015  . Gastro-esophageal reflux disease without esophagitis 03/20/2015  . IBS (irritable bowel syndrome) 03/20/2015  . Menopause 03/20/2015  . Vitamin D deficiency 03/20/2015  . Vasovagal syncope 08/18/2014  . PSVT (paroxysmal supraventricular tachycardia) (HCC) 04/12/2012  . External hemorrhoids 07/15/2007   [name], PT, DPT # [phone] 03/19/2018, 3:22 PM  [facility] [address] [place], [place], [address] Phone: [phone]   Fax:  [phone]  Name: Chelsea Decker MRN: [id number] Date of Birth: [birth year]

## 2018-03-21 ENCOUNTER — Ambulatory Visit: Payer: PRIVATE HEALTH INSURANCE | Admitting: Physical Therapy

## 2018-03-21 ENCOUNTER — Encounter: Payer: Self-pay | Admitting: Physical Therapy

## 2018-03-21 DIAGNOSIS — M25562 Pain in left knee: Secondary | ICD-10-CM

## 2018-03-21 DIAGNOSIS — M25662 Stiffness of left knee, not elsewhere classified: Secondary | ICD-10-CM

## 2018-03-21 DIAGNOSIS — M6281 Muscle weakness (generalized): Secondary | ICD-10-CM

## 2018-03-21 DIAGNOSIS — R269 Unspecified abnormalities of gait and mobility: Secondary | ICD-10-CM

## 2018-03-25 ENCOUNTER — Ambulatory Visit: Payer: PRIVATE HEALTH INSURANCE | Admitting: Physical Therapy

## 2018-03-25 DIAGNOSIS — M25562 Pain in left knee: Secondary | ICD-10-CM | POA: Diagnosis not present

## 2018-03-25 DIAGNOSIS — M6281 Muscle weakness (generalized): Secondary | ICD-10-CM

## 2018-03-25 DIAGNOSIS — M25662 Stiffness of left knee, not elsewhere classified: Secondary | ICD-10-CM

## 2018-03-25 DIAGNOSIS — R269 Unspecified abnormalities of gait and mobility: Secondary | ICD-10-CM

## 2018-03-25 NOTE — Therapy (Signed)
[facility] [address] [place], [place], [address] Phone: [phone]   Fax:  [phone]  Physical Therapy Treatment  Patient Details  Name: Chelsea Decker MRN: [id number] Date of Birth: 07-Apr-1961 Referring Provider: Dr. Odis LusterBowers   Encounter Date: 03/25/2018  PT End of Session - 03/26/18 0810    Visit Number  10    Number of Visits  12    Date for PT Re-Evaluation  04/03/18    Authorization Type  3 of 8 COMP    PT Start Time  1503    PT Stop Time  1556    PT Time Calculation (min)  53 min    Activity Tolerance  Patient tolerated treatment well    Behavior During Therapy  [facility] for tasks assessed/performed       Past Medical History:  Diagnosis Date  . Anxiety   . Asthma   . External hemorrhoids without mention of complication   . GERD (gastroesophageal reflux disease)   . Heart palpitations   . IBS (irritable bowel syndrome)   . Lump or mass in breast   . Palpitations   . PSVT (paroxysmal supraventricular tachycardia) (HCC) 04/12/2012  . Unspecified vitamin D deficiency     Past Surgical History:  Procedure Laterality Date  . ABDOMINAL HYSTERECTOMY     2013  . BREAST BIOPSY Left 2013   core w/clip - neg  . LAPAROSCOPIC TOTAL HYSTERECTOMY      There were no vitals filed for this visit.  Subjective Assessment - 03/26/18 0809    Subjective  Pt. reports no new complaints.  No knee pain at this time but tenderness with palpation over L lateral aspect of knee.      Pertinent History  Pt. fell while walking into residents [facility] [employer].  R foot slipped and pt. fell on L knee/elbow.  Pt. is currently on Light Duty status until 03/19/18.  Pt. using w/c at work and doing seated work.  Pt. arrived to PT with use of axillary crutches.      Limitations  Lifting;Standing;Walking;House hold activities    Patient Stated Goals  return to work with no L knee limitations.  Amb. with no issues.      Currently in  Pain?  Yes    Pain Score  4     Pain Location  Knee    Pain Orientation  Left    Pain Descriptors / Indicators  Aching    Aggravating Factors   Increase activity.  No pain reported several times during ther.ex.        There.ex.:  Scifit L7 10 min. B UE/LE (warm-up). Single leg squat/ kneeling at //-bars 10x (mirror feedback)- L leg up with min. To no UE assist to return to standing Stairs with recip. Pattern 10x (no UE assist)  Supine LE stretches (generalized)- all planes 9 min. TG knee flexion (bilateral/ single leg)- 20x each. BOSU step ups with increase hip/knee flexion (slight hold).   Walking in hallway with varying cadence and consistent gait pattern/ heel strike.     PT Long Term Goals - 03/14/18 1607      PT LONG TERM GOAL #1   Title  Pt. will increase FOTO to 49 to improve pain-free mobility/ return to work.      Baseline  FOTO: baseline 26.  Today: 69 (significant improvement).      Time  6    Period  Weeks    Status  Achieved    Target Date  03/14/18      PT LONG TERM GOAL #2   Title  Pt. will increase L knee AROM to >130 deg. to improve pain-free mobility/ walking.     Baseline  L knee AROM: 0 to >140 deg.      Time  6    Period  Weeks    Status  Achieved    Target Date  03/14/18      PT LONG TERM GOAL #3   Title  Pt. will increase L hip/knee strength to grossly 4+/5 MMT to improve pain-free mobility/ return to work without restriction.      Baseline  R LE strength: 5/5 MMT quad/ hamstring. 4+/5 hip flexion. 5/5 ankle. L LE strength grossly 4+/5 MMT (no pain)    Time  6    Period  Weeks    Status  Achieved    Target Date  03/14/18      PT LONG TERM GOAL #4   Title  Pt. able to ambulate with normalized gait pattern and no assistive device community distances safely.      Baseline  marked improvement in normalized gait with no assistive device (cuing to increase cadence)    Time  6    Period  Weeks    Status  Achieved    Target Date  03/14/18      PT  LONG TERM GOAL #5   Title  Pt. able to return to work at [employer] with no L knee pain or limitations.      Baseline  pt. working Light Duty at this time.  Pt. returns to MD on 8/6 to determine if able to RTW without restriction.      Time  6    Period  Weeks    Status  On-going    Target Date  04/03/18         Plan - 03/26/18 0811    Clinical Impression Statement  Increase step pattern/ heel strike with all aspects of gait in PT clinic without use of SPC.  Improved LE stability noted during single leg squats/ walking lunges with increase hold time/ no UE assist.  Pt. able to flexion L knee and place on TG during R knee single leg squats with no increase c/o L knee pain.      Clinical Presentation  Stable    Clinical Decision Making  Low    Rehab Potential  Excellent    PT Frequency  2x / week    PT Duration  6 weeks    PT Treatment/Interventions  ADLs/Self Care Home Management;Aquatic Therapy;Cryotherapy;Electrical Stimulation;Moist Heat;Balance training;Therapeutic exercise;Therapeutic activities;Functional mobility training;Stair training;Gait training;Neuromuscular re-education;Patient/family education;Manual techniques;Passive range of motion;Dry needling;Taping    PT Next Visit Plan  Progress L quad strengthening/ return to work with no restriction.      PT Home Exercise Plan  See HEP    Consulted and Agree with Plan of Care  Patient       Patient will benefit from skilled therapeutic intervention in order to improve the following deficits and impairments:  Abnormal gait, Improper body mechanics, Pain, Decreased mobility, Decreased activity tolerance, Decreased endurance, Decreased range of motion, Decreased strength, Hypomobility, Impaired flexibility, Difficulty walking, Decreased balance  Visit Diagnosis: Acute pain of left knee  Joint stiffness of knee, left  Muscle weakness (generalized)  Gait difficulty     Problem List Patient Active Problem List    Diagnosis Date Noted  . History  of shingles 04/17/2017  . Post-menopause on HRT (hormone replacement therapy) 11/05/2015  . Fasting hyperglycemia 08/10/2015  . Anxiety, generalized 03/20/2015  . Bradycardia 03/20/2015  . Chronic tension-type headache, intractable 03/20/2015  . Gastro-esophageal reflux disease without esophagitis 03/20/2015  . IBS (irritable bowel syndrome) 03/20/2015  . Menopause 03/20/2015  . Vitamin D deficiency 03/20/2015  . Vasovagal syncope 08/18/2014  . PSVT (paroxysmal supraventricular tachycardia) (HCC) 04/12/2012  . External hemorrhoids 07/15/2007   Cammie McgeeMichael C Liora Myles, PT, DPT # [phone] 03/27/2018, 2:44 PM  [facility] [address] [place], [place], [address] Phone: [phone]   Fax:  [phone]  Name: Chelsea LotVanessa M Ewer MRN: [id number] Date of Birth: 10/15/1960

## 2018-03-25 NOTE — Therapy (Signed)
[facility] [facility] [address] [place], [place], [address] Phone: [phone]   Fax:  [phone]  Physical Therapy Treatment  Patient Details  Name: Chelsea Decker Soja MRN: [id number] Date of Birth: [birth year] Referring Provider: Dr. Odis LusterBowers   Encounter Date: 03/21/2018  PT End of Session - 03/25/18 1339    Visit Number  9    Number of Visits  12    Date for PT Re-Evaluation  04/03/18    Authorization Type  2 of 8 COMP    PT Start Time  1525    PT Stop Time  1601    PT Time Calculation (min)  36 min    Activity Tolerance  Patient tolerated treatment well    Behavior During Therapy  [facility] for tasks assessed/performed       Past Medical History:  Diagnosis Date  . Anxiety   . Asthma   . External hemorrhoids without mention of complication   . GERD (gastroesophageal reflux disease)   . Heart palpitations   . IBS (irritable bowel syndrome)   . Lump or mass in breast   . Palpitations   . PSVT (paroxysmal supraventricular tachycardia) (HCC) 04/12/2012  . Unspecified vitamin D deficiency     Past Surgical History:  Procedure Laterality Date  . ABDOMINAL HYSTERECTOMY     2013  . BREAST BIOPSY Left 2013   core w/clip - neg  . LAPAROSCOPIC TOTAL HYSTERECTOMY      There were no vitals filed for this visit.      TREATMENT  Ther ex: Scifit L710 min. B UE/LE (no charge/ warm-up)- seat #5 TG knee flexion 20x(midline with ball/ toe in/ toe out)/ single leg 15x2 each. Nautilus resisted gait: 95# forward/ backwards. 70# lateral walking L/R.  Walking lunges in hallway/ high marching/ tandem gait (no UE assist).  BOSU step ups on R/L with hip flexion in //-bars (light UE assist/ mirror feedback)5x each Discussed standing hip/knee ex. At home.    PT Long Term Goals - 03/14/18 1607      PT LONG TERM GOAL #1   Title  Pt. will increase FOTO to 49 to improve pain-free mobility/ return to work.      Baseline  FOTO: baseline 26.   Today: 69 (significant improvement).      Time  6    Period  Weeks    Status  Achieved    Target Date  03/14/18      PT LONG TERM GOAL #2   Title  Pt. will increase L knee AROM to >130 deg. to improve pain-free mobility/ walking.     Baseline  L knee AROM: 0 to >140 deg.      Time  6    Period  Weeks    Status  Achieved    Target Date  03/14/18      PT LONG TERM GOAL #3   Title  Pt. will increase L hip/knee strength to grossly 4+/5 MMT to improve pain-free mobility/ return to work without restriction.      Baseline  R LE strength: 5/5 MMT quad/ hamstring. 4+/5 hip flexion. 5/5 ankle. L LE strength grossly 4+/5 MMT (no pain)    Time  6    Period  Weeks    Status  Achieved    Target Date  03/14/18      PT LONG TERM GOAL #4   Title  Pt. able to ambulate with normalized gait pattern and no assistive device community  distances safely.      Baseline  marked improvement in normalized gait with no assistive device (cuing to increase cadence)    Time  6    Period  Weeks    Status  Achieved    Target Date  03/14/18      PT LONG TERM GOAL #5   Title  Pt. able to return to work at [employer] with no L knee pain or limitations.      Baseline  pt. working Light Duty at this time.  Pt. returns to MD on 8/6 to determine if able to RTW without restriction.      Time  6    Period  Weeks    Status  On-going    Target Date  04/03/18          Plan - 03/25/18 1339    Clinical Impression Statement  Marked increase in step pattern/ length during gait tasks in PT clinic.  Good patellar tracking with progress being made with L quad/ LE muscle strengthening.  Pt. has progressed off of SPC and use of knee brace with everyday walking.  Tx. time limited today secondary to pt. has appt. after [place] tx[place].      Clinical Presentation  Stable    Clinical Decision Making  Low    Rehab Potential  Excellent    PT Frequency  2x / week    PT Duration  6 weeks    PT  Treatment/Interventions  ADLs/Self Care Home Management;Aquatic Therapy;Cryotherapy;Electrical Stimulation;Moist Heat;Balance training;Therapeutic exercise;Therapeutic activities;Functional mobility training;Stair training;Gait training;Neuromuscular re-education;Patient/family education;Manual techniques;Passive range of motion;Dry needling;Taping    PT Next Visit Plan  Progress L quad strengthening/ return to work with no restriction.      PT Home Exercise Plan  See HEP       Patient will benefit from skilled therapeutic intervention in order to improve the following deficits and impairments:  Abnormal gait, Improper body mechanics, Pain, Decreased mobility, Decreased activity tolerance, Decreased endurance, Decreased range of motion, Decreased strength, Hypomobility, Impaired flexibility, Difficulty walking, Decreased balance  Visit Diagnosis: Acute pain of left knee  Joint stiffness of knee, left  Muscle weakness (generalized)  Gait difficulty     Problem List Patient Active Problem List   Diagnosis Date Noted  . History of shingles 04/17/2017  . Post-menopause on HRT (hormone replacement therapy) 11/05/2015  . Fasting hyperglycemia 08/10/2015  . Anxiety, generalized 03/20/2015  . Bradycardia 03/20/2015  . Chronic tension-type headache, intractable 03/20/2015  . Gastro-esophageal reflux disease without esophagitis 03/20/2015  . IBS (irritable bowel syndrome) 03/20/2015  . Menopause 03/20/2015  . Vitamin D deficiency 03/20/2015  . Vasovagal syncope 08/18/2014  . PSVT (paroxysmal supraventricular tachycardia) (HCC) 04/12/2012  . External hemorrhoids 07/15/2007   Cammie McgeeMichael C Kyah Buesing, PT, DPT # [id number] 03/25/2018, 2:12 PM  [place] [facility] [address] [place], [place], [address] Phone: [phone]   Fax:  [phone]  Name: Chelsea Decker Vidovich MRN: [id number] Date of Birth: Feb 19, 1961

## 2018-03-26 ENCOUNTER — Encounter: Payer: Self-pay | Admitting: Physical Therapy

## 2018-03-26 ENCOUNTER — Encounter: Payer: PRIVATE HEALTH INSURANCE | Admitting: Physical Therapy

## 2018-03-27 ENCOUNTER — Ambulatory Visit: Payer: PRIVATE HEALTH INSURANCE | Admitting: Physical Therapy

## 2018-03-27 ENCOUNTER — Encounter: Payer: Self-pay | Admitting: Physical Therapy

## 2018-03-27 DIAGNOSIS — M25562 Pain in left knee: Secondary | ICD-10-CM | POA: Diagnosis not present

## 2018-03-27 DIAGNOSIS — R269 Unspecified abnormalities of gait and mobility: Secondary | ICD-10-CM

## 2018-03-27 DIAGNOSIS — M6281 Muscle weakness (generalized): Secondary | ICD-10-CM

## 2018-03-27 DIAGNOSIS — M25662 Stiffness of left knee, not elsewhere classified: Secondary | ICD-10-CM

## 2018-03-27 NOTE — Therapy (Addendum)
[facility] [facility] [address] [place], [place], [address] Phone: [phone]   Fax:  [phone]  Physical Therapy Treatment  Patient Details  Name: Chelsea Decker MRN: [id number] Date of Birth: 08/09/1961 Referring Provider: Dr. Odis LusterBowers   Encounter Date: 03/27/2018  PT End of Session - 03/27/18 1449    Visit Number  11    Number of Visits  12    Date for PT Re-Evaluation  04/03/18    Authorization Type  4 of 8 COMP    Activity Tolerance  Patient tolerated treatment well    Behavior During Therapy  [facility] for tasks assessed/performed       Past Medical History:  Diagnosis Date  . Anxiety   . Asthma   . External hemorrhoids without mention of complication   . GERD (gastroesophageal reflux disease)   . Heart palpitations   . IBS (irritable bowel syndrome)   . Lump or mass in breast   . Palpitations   . PSVT (paroxysmal supraventricular tachycardia) (HCC) 04/12/2012  . Unspecified vitamin D deficiency     Past Surgical History:  Procedure Laterality Date  . ABDOMINAL HYSTERECTOMY     2013  . BREAST BIOPSY Left 2013   core w/clip - neg  . LAPAROSCOPIC TOTAL HYSTERECTOMY      There were no vitals filed for this visit.  Subjective Assessment - 03/27/18 1449    Subjective  Pt. reports a little pain in L knee (lateral aspect) from using w/c at work.      Pertinent History  Pt. fell while walking into residents house at [employer].  R foot slipped and pt. fell on L knee/elbow.  Pt. is currently on Light Duty status until 03/19/18.  Pt. using w/c at work and doing seated work.  Pt. arrived to PT with use of axillary crutches.      Limitations  Lifting;Standing;Walking;House hold activities    Patient Stated Goals  return to work with no L knee limitations.  Amb. with no issues.      Currently in Pain?  Yes    Pain Score  3     Pain Location  Knee    Pain Orientation  Left    Pain Descriptors / Indicators  Aching         There.ex.: Scifit L7.5 10 min. B UE/LE (warm-up/no charge). Standing squats on Airex (at //-bars) 20x.  Cuing for proper technique.   5# seated LAQ/ standing hamstring curls/ heel raises/ hip abduction 20x each Walking around PT clinic with increase hip flexion/ step pattern and 5# ankle wts.  SLS on Airex  Single leg squat/ kneeling at //-bars 10x (mirror feedback)- L leg up with min. To no UE assist to return to standing Stairs with recip. Pattern 10x (no UE assist)  Supine LE stretches (generalized)- all planes 9 min. TG knee flexion (bilateral/ single leg)- 20x each. TM walking at 2 mph with cuing to increase step pattern/ heel strike.  L UE assist on handrail progressing from 0-10% incline grade.   Resisted gait 2BTB 5x forward/backwards with no UE.        No assistive device required with all aspects of gait inside/ outside. Pt. progressing well with L quad stability and normalized gait pattern. Minimal L patella tenderness with mobs./ palpation. No pain limitations with higher level strengthening ex. in clinic.   PT Long Term Goals - 03/14/18 1607      PT LONG TERM GOAL #1  Title  Pt. will increase FOTO to 49 to improve pain-free mobility/ return to work.      Baseline  FOTO: baseline 26.  Today: 69 (significant improvement).      Time  6    Period  Weeks    Status  Achieved    Target Date  03/14/18      PT LONG TERM GOAL #2   Title  Pt. will increase L knee AROM to >130 deg. to improve pain-free mobility/ walking.     Baseline  L knee AROM: 0 to >140 deg.      Time  6    Period  Weeks    Status  Achieved    Target Date  03/14/18      PT LONG TERM GOAL #3   Title  Pt. will increase L hip/knee strength to grossly 4+/5 MMT to improve pain-free mobility/ return to work without restriction.      Baseline  R LE strength: 5/5 MMT quad/ hamstring. 4+/5 hip flexion. 5/5 ankle. L LE strength grossly 4+/5 MMT (no pain)    Time  6    Period  Weeks    Status  Achieved     Target Date  03/14/18      PT LONG TERM GOAL #4   Title  Pt. able to ambulate with normalized gait pattern and no assistive device community distances safely.      Baseline  marked improvement in normalized gait with no assistive device (cuing to increase cadence)    Time  6    Period  Weeks    Status  Achieved    Target Date  03/14/18      PT LONG TERM GOAL #5   Title  Pt. able to return to work at [employer] with no L knee pain or limitations.      Baseline  pt. working Light Duty at this time.  Pt. returns to MD on 8/6 to determine if able to RTW without restriction.      Time  6    Period  Weeks    Status  On-going    Target Date  04/03/18            Plan - 03/27/18 1449    Clinical Presentation  Stable    Clinical Decision Making  Low    Rehab Potential  Excellent    PT Frequency  2x / week    PT Duration  6 weeks    PT Treatment/Interventions  ADLs/Self Care Home Management;Aquatic Therapy;Cryotherapy;Electrical Stimulation;Moist Heat;Balance training;Therapeutic exercise;Therapeutic activities;Functional mobility training;Stair training;Gait training;Neuromuscular re-education;Patient/family education;Manual techniques;Passive range of motion;Dry needling;Taping    PT Next Visit Plan  Progress L quad strengthening/ return to work with no restriction.      PT Home Exercise Plan  See HEP    Consulted and Agree with Plan of Care  Patient       Patient will benefit from skilled therapeutic intervention in order to improve the following deficits and impairments:  Abnormal gait, Improper body mechanics, Pain, Decreased mobility, Decreased activity tolerance, Decreased endurance, Decreased range of motion, Decreased strength, Hypomobility, Impaired flexibility, Difficulty walking, Decreased balance  Visit Diagnosis: Acute pain of left knee  Joint stiffness of knee, left  Muscle weakness (generalized)  Gait difficulty     Problem List Patient Active  Problem List   Diagnosis Date Noted  . History of shingles 04/17/2017  . Post-menopause on HRT (hormone replacement therapy) 11/05/2015  . Fasting hyperglycemia  08/10/2015  . Anxiety, generalized 03/20/2015  . Bradycardia 03/20/2015  . Chronic tension-type headache, intractable 03/20/2015  . Gastro-esophageal reflux disease without esophagitis 03/20/2015  . IBS (irritable bowel syndrome) 03/20/2015  . Menopause 03/20/2015  . Vitamin D deficiency 03/20/2015  . Vasovagal syncope 08/18/2014  . PSVT (paroxysmal supraventricular tachycardia) (HCC) 04/12/2012  . External hemorrhoids 07/15/2007   Cammie McgeeMichael C Leia Coletti, PT, DPT # [id number] 03/27/2018, 3:03 PM  [place] [facility] [address] [place], [place], [address] Phone: [phone]   Fax:  [phone]  Name: Chelsea Decker MRN: [id number] Date of Birth: 1961/02/04

## 2018-04-01 ENCOUNTER — Ambulatory Visit: Payer: PRIVATE HEALTH INSURANCE | Admitting: Physical Therapy

## 2018-04-02 ENCOUNTER — Ambulatory Visit: Payer: PRIVATE HEALTH INSURANCE | Admitting: Physical Therapy

## 2018-04-02 DIAGNOSIS — M25562 Pain in left knee: Secondary | ICD-10-CM

## 2018-04-02 DIAGNOSIS — M6281 Muscle weakness (generalized): Secondary | ICD-10-CM

## 2018-04-02 DIAGNOSIS — M25662 Stiffness of left knee, not elsewhere classified: Secondary | ICD-10-CM

## 2018-04-02 DIAGNOSIS — R269 Unspecified abnormalities of gait and mobility: Secondary | ICD-10-CM

## 2018-04-03 ENCOUNTER — Encounter: Payer: PRIVATE HEALTH INSURANCE | Admitting: Physical Therapy

## 2018-04-04 ENCOUNTER — Ambulatory Visit: Payer: PRIVATE HEALTH INSURANCE | Attending: Orthopedic Surgery | Admitting: Physical Therapy

## 2018-04-04 DIAGNOSIS — M25562 Pain in left knee: Secondary | ICD-10-CM | POA: Diagnosis present

## 2018-04-04 DIAGNOSIS — M6281 Muscle weakness (generalized): Secondary | ICD-10-CM | POA: Insufficient documentation

## 2018-04-04 DIAGNOSIS — R269 Unspecified abnormalities of gait and mobility: Secondary | ICD-10-CM | POA: Diagnosis present

## 2018-04-04 DIAGNOSIS — M25662 Stiffness of left knee, not elsewhere classified: Secondary | ICD-10-CM | POA: Diagnosis present

## 2018-04-04 NOTE — Therapy (Addendum)
[place] [facility] [address] [place], [place], [address] Phone: [phone]   Fax:  [phone]  Physical Therapy Treatment  Patient Details  Name: Chelsea Decker MRN: [id number] Date of Birth: Nov 08, 1960 Referring Provider: Dr. Harlow Mares   Encounter Date: 04/04/2018  PT End of Session - 04/23/18 1351    Visit Number  13    Number of Visits  20    Date for PT Re-Evaluation  05/02/18    PT Start Time  1505    PT Stop Time  1602    PT Time Calculation (min)  57 min    Activity Tolerance  Patient tolerated treatment well    Behavior During Therapy  [facility] for tasks assessed/performed       Past Medical History:  Diagnosis Date  . Anxiety   . Asthma   . External hemorrhoids without mention of complication   . GERD (gastroesophageal reflux disease)   . Heart palpitations   . IBS (irritable bowel syndrome)   . Lump or mass in breast   . Palpitations   . PSVT (paroxysmal supraventricular tachycardia) ([place]) 04/12/2012  . Unspecified vitamin D deficiency     Past Surgical History:  Procedure Laterality Date  . ABDOMINAL HYSTERECTOMY     2013  . BREAST BIOPSY Left 2013   core w/clip - neg  . LAPAROSCOPIC TOTAL HYSTERECTOMY      There were no vitals filed for this visit.  Subjective Assessment - 04/22/18 0755    Subjective  Pt. reports minimal L knee discomfort prior to PT tx. session 3/10.  Pt. reports no issues at work today.      Pertinent History  Pt. fell while walking into residents house at [employer].  R foot slipped and pt. fell on L knee/elbow.  Pt. is currently on Light Duty status until 03/19/18.  Pt. using w/c at work and doing seated work.  Pt. arrived to PT with use of axillary crutches.      Limitations  Lifting;Standing;Walking;House hold activities    Patient Stated Goals  return to work with no L knee limitations.  Amb. with no issues.      Currently in Pain?  Yes    Pain Score  3     Pain Location   Knee    Pain Orientation  Left    Pain Descriptors / Indicators  Aching        Treatment:  Scifit L7 10 min. (warm-up/no charge). Nautilus: resisted gait 2BTB 5x all 4-planes (consistent recip. Step pattern/ cuing for heel and toe strike).   25-35# floor to waist box lifting/ carrying in gym.   12" step ups on L/R 10x each with no UE assist. Supine L LE manual resistance (hip flexion/ extension)- 10x with max resistance.   Reviewed HEP in depth.        PT Long Term Goals - 04/23/18 1356      PT LONG TERM GOAL #1   Title  Pt. will increase FOTO to 49 to improve pain-free mobility/ return to work.      Baseline  FOTO: baseline 26.  Today: 69 (significant improvement).      Time  6    Period  Weeks    Status  Achieved    Target Date  03/14/18      PT LONG TERM GOAL #2   Title  Pt. will increase L knee AROM to >130 deg. to improve pain-free mobility/ walking.  Baseline  L knee AROM: 0 to >140 deg.      Time  6    Period  Weeks    Status  Achieved    Target Date  03/14/18      PT LONG TERM GOAL #3   Title  Pt. will increase L hip/knee strength to grossly 4+/5 MMT to improve pain-free mobility/ return to work without restriction.      Baseline  R LE strength: 5/5 MMT quad/ hamstring. 4+/5 hip flexion. 5/5 ankle. L LE strength grossly 4+/5 MMT (no pain)    Time  6    Period  Weeks    Status  Achieved    Target Date  03/14/18      PT LONG TERM GOAL #4   Title  Pt. able to ambulate with normalized gait pattern and no assistive device community distances safely.      Baseline  marked improvement in normalized gait with no assistive device (cuing to increase cadence)    Time  6    Period  Weeks    Status  Achieved    Target Date  03/14/18      PT LONG TERM GOAL #5   Title  Pt. able to return to work at [employer] with no L knee pain or limitations.      Baseline  pt. working Light Duty at this time.  Pt. returns to MD in a week to determine RTW status without  restriction    Time  4    Period  Weeks    Status  Not Met    Target Date  05/02/18      Additional Long Term Goals   Additional Long Term Goals  Yes      PT LONG TERM GOAL #6   Title  Pt. able to lift 20# box from floor to waist with proper body mechanics and no L knee pain to promote return to work.      Baseline  Slight increase in L knee pain with knee flexoin/ lifting    Time  4    Period  Weeks    Status  New    Target Date  05/02/18         Plan - 04/23/18 1353    Clinical Impression Statement  Pt. presents with full L knee AROM as compared to R knee with good patellar tracking.  Minimal L knee joint line/ patella tenderness with palpation.  Pt. ambulates with more normalized gait pattern without assistive device and consistent step pattern.  Slight dicomfort with knee flexion during floor to waist lifing.  Pt. progressing towards all PT goals and will benefit from continued PT to promote return to work with no limitations.      Clinical Presentation  Stable    Clinical Decision Making  Low    Rehab Potential  Excellent    PT Frequency  2x / week    PT Duration  4 weeks    PT Treatment/Interventions  ADLs/Self Care Home Management;Aquatic Therapy;Cryotherapy;Electrical Stimulation;Moist Heat;Balance training;Therapeutic exercise;Therapeutic activities;Functional mobility training;Stair training;Gait training;Neuromuscular re-education;Patient/family education;Manual techniques;Passive range of motion;Dry needling;Taping    PT Next Visit Plan  Progress strengthening/ overall functional mobility to promote return to work with no limitations.      PT Home Exercise Plan  See HEP    Consulted and Agree with Plan of Care  Patient       Patient will benefit from skilled therapeutic intervention in order to improve  the following deficits and impairments:  Abnormal gait, Improper body mechanics, Pain, Decreased mobility, Decreased activity tolerance, Decreased endurance, Decreased  range of motion, Decreased strength, Hypomobility, Impaired flexibility, Difficulty walking, Decreased balance  Visit Diagnosis: Acute pain of left knee  Joint stiffness of knee, left  Muscle weakness (generalized)  Gait difficulty     Problem List Patient Active Problem List   Diagnosis Date Noted  . History of shingles 04/17/2017  . Post-menopause on HRT (hormone replacement therapy) 11/05/2015  . Fasting hyperglycemia 08/10/2015  . Anxiety, generalized 03/20/2015  . Bradycardia 03/20/2015  . Chronic tension-type headache, intractable 03/20/2015  . Gastro-esophageal reflux disease without esophagitis 03/20/2015  . IBS (irritable bowel syndrome) 03/20/2015  . Menopause 03/20/2015  . Vitamin D deficiency 03/20/2015  . Vasovagal syncope 08/18/2014  . PSVT (paroxysmal supraventricular tachycardia) ([place]) 04/12/2012  . External hemorrhoids 07/15/2007   Pura Spice, PT, DPT # [phone] 04/23/2018, 1:59 PM  [facility] [address] [place], [place], [address] Phone: [phone]   Fax:  [phone]  Name: Chelsea Decker MRN: [id number] Date of Birth: April 13, 1961

## 2018-04-09 ENCOUNTER — Ambulatory Visit: Payer: PRIVATE HEALTH INSURANCE | Admitting: Physical Therapy

## 2018-04-09 DIAGNOSIS — M25562 Pain in left knee: Secondary | ICD-10-CM | POA: Diagnosis not present

## 2018-04-09 DIAGNOSIS — M6281 Muscle weakness (generalized): Secondary | ICD-10-CM

## 2018-04-09 DIAGNOSIS — R269 Unspecified abnormalities of gait and mobility: Secondary | ICD-10-CM

## 2018-04-09 DIAGNOSIS — M25662 Stiffness of left knee, not elsewhere classified: Secondary | ICD-10-CM

## 2018-04-11 ENCOUNTER — Ambulatory Visit: Payer: PRIVATE HEALTH INSURANCE | Admitting: Physical Therapy

## 2018-04-11 DIAGNOSIS — M6281 Muscle weakness (generalized): Secondary | ICD-10-CM

## 2018-04-11 DIAGNOSIS — M25562 Pain in left knee: Secondary | ICD-10-CM

## 2018-04-11 DIAGNOSIS — M25662 Stiffness of left knee, not elsewhere classified: Secondary | ICD-10-CM

## 2018-04-11 DIAGNOSIS — R269 Unspecified abnormalities of gait and mobility: Secondary | ICD-10-CM

## 2018-04-12 NOTE — Therapy (Addendum)
[facility] [address]. [place], [place], [address] Phone: [phone]   Fax:  [phone]  Physical Therapy Treatment  Patient Details  Name: Chelsea Decker MRN: [id number] Date of Birth: 11-18-1960 Referring Provider: Dr. Odis Luster   Encounter Date: 04/02/2018  PT End of Session - 04/21/18 2042    Visit Number  12    Number of Visits  12    Date for PT Re-Evaluation  04/03/18    PT Start Time  1502    PT Stop Time  1555    PT Time Calculation (min)  53 min    Activity Tolerance  Patient tolerated treatment well    Behavior During Therapy  [facility] for tasks assessed/performed       Past Medical History:  Diagnosis Date  . Anxiety   . Asthma   . External hemorrhoids without mention of complication   . GERD (gastroesophageal reflux disease)   . Heart palpitations   . IBS (irritable bowel syndrome)   . Lump or mass in breast   . Palpitations   . PSVT (paroxysmal supraventricular tachycardia) (HCC) 04/12/2012  . Unspecified vitamin D deficiency     Past Surgical History:  Procedure Laterality Date  . ABDOMINAL HYSTERECTOMY     2013  . BREAST BIOPSY Left 2013   core w/clip - neg  . LAPAROSCOPIC TOTAL HYSTERECTOMY      There were no vitals filed for this visit.  Subjective Assessment - 04/21/18 2031    Subjective  Pt. reports minimal L knee pain prior to PT tx. session.  Pt. worked all day (primarily in a w/c).    Pertinent History  Pt. fell while walking into residents house at [facility].  R foot slipped and pt. fell on L knee/elbow.  Pt. is currently on Light Duty status until 03/19/18.  Pt. using w/c at work and doing seated work.  Pt. arrived to PT with use of axillary crutches.      Limitations  Lifting;Standing;Walking;House hold activities    Patient Stated Goals  return to work with no L knee limitations.  Amb. with no issues.      Currently in Pain?  Yes    Pain Score  3     Pain Location  Knee    Pain Orientation  Left    Pain Descriptors / Indicators  Aching       There.ex.: Scifit L7 10 min. B UE/LE(warm-up/no charge). Nautilus resisted walking: 70# lateral/ 80# forward with added lunge Stairs with recip. Pattern 10x (no UE assist)  Supine LE stretches(generalized)- all planes 7 min.  (focus on knee flexion/ increase overpressure and static holds) TG knee flexion (bilateral/ single leg)- 20x each.  Heel raises 20x with gastroc stretches. Sled push/pull 70# with good technique.   Agility ladder/ increase cadence in hallway        PT Long Term Goals - 03/14/18 1607      PT LONG TERM GOAL #1   Title  Pt. will increase FOTO to 49 to improve pain-free mobility/ return to work.      Baseline  FOTO: baseline 26.  Today: 69 (significant improvement).      Time  6    Period  Weeks    Status  Achieved    Target Date  03/14/18      PT LONG TERM GOAL #2   Title  Pt. will increase L knee AROM to >130 deg. to improve pain-free mobility/  walking.     Baseline  L knee AROM: 0 to >140 deg.      Time  6    Period  Weeks    Status  Achieved    Target Date  03/14/18      PT LONG TERM GOAL #3   Title  Pt. will increase L hip/knee strength to grossly 4+/5 MMT to improve pain-free mobility/ return to work without restriction.      Baseline  R LE strength: 5/5 MMT quad/ hamstring. 4+/5 hip flexion. 5/5 ankle. L LE strength grossly 4+/5 MMT (no pain)    Time  6    Period  Weeks    Status  Achieved    Target Date  03/14/18      PT LONG TERM GOAL #4   Title  Pt. able to ambulate with normalized gait pattern and no assistive device community distances safely.      Baseline  marked improvement in normalized gait with no assistive device (cuing to increase cadence)    Time  6    Period  Weeks    Status  Achieved    Target Date  03/14/18      PT LONG TERM GOAL #5   Title  Pt. able to return to work at [employer] with no L knee pain or limitations.      Baseline  pt.  working Light Duty at this time.  Pt. returns to MD on 8/6 to determine if able to RTW without restriction.      Time  6    Period  Weeks    Status  On-going    Target Date  04/03/18         Plan - 04/21/18 2052    Clinical Impression Statement  Marked improvement with increase cadence and agility based tasks in hallway.  No c/o knee pain with higher level LE stability tasks on BOSU/ deep squats.  PT advised pt. to discuss with employer/ MD to discharge use of w/c at work.  Pt. may require an MD note to discharge w/c use.      Clinical Presentation  Stable    Clinical Decision Making  Low    Rehab Potential  Excellent    PT Frequency  2x / week    PT Duration  6 weeks    PT Treatment/Interventions  ADLs/Self Care Home Management;Aquatic Therapy;Cryotherapy;Electrical Stimulation;Moist Heat;Balance training;Therapeutic exercise;Therapeutic activities;Functional mobility training;Stair training;Gait training;Neuromuscular re-education;Patient/family education;Manual techniques;Passive range of motion;Dry needling;Taping    PT Next Visit Plan  Recert next. tx. session.         Patient will benefit from skilled therapeutic intervention in order to improve the following deficits and impairments:  Abnormal gait, Improper body mechanics, Pain, Decreased mobility, Decreased activity tolerance, Decreased endurance, Decreased range of motion, Decreased strength, Hypomobility, Impaired flexibility, Difficulty walking, Decreased balance  Visit Diagnosis: Acute pain of left knee  Joint stiffness of knee, left  Muscle weakness (generalized)  Gait difficulty     Problem List Patient Active Problem List   Diagnosis Date Noted  . History of shingles 04/17/2017  . Post-menopause on HRT (hormone replacement therapy) 11/05/2015  . Fasting hyperglycemia 08/10/2015  . Anxiety, generalized 03/20/2015  . Bradycardia 03/20/2015  . Chronic tension-type headache, intractable 03/20/2015  .  Gastro-esophageal reflux disease without esophagitis 03/20/2015  . IBS (irritable bowel syndrome) 03/20/2015  . Menopause 03/20/2015  . Vitamin D deficiency 03/20/2015  . Vasovagal syncope 08/18/2014  . PSVT (paroxysmal supraventricular tachycardia) (  HCC) 04/12/2012  . External hemorrhoids 07/15/2007   [name], PT, DPT # [phone] 04/22/2018, 7:49 AM  [facility] [address] [place], [place], [address] Phone: [phone]   Fax:  [phone]  Name: JASMINEMARIE SHERRARD MRN: [id number] Date of Birth: 03-22-1961

## 2018-04-12 NOTE — Therapy (Addendum)
[facility] [address] [place], [place], [address] Phone: [phone]   Fax:  [phone]  Physical Therapy Treatment  Patient Details  Name: Chelsea Decker MRN: [id number] Date of Birth: May 07, 1961 Referring Provider: Dr. Harlow Mares   Encounter Date: 04/09/2018  PT End of Session - 04/23/18 1405    Visit Number  14    Number of Visits  20    Date for PT Re-Evaluation  05/02/18    PT Start Time  1503    PT Stop Time  1559    PT Time Calculation (min)  56 min    Activity Tolerance  Patient tolerated treatment well    Behavior During Therapy  [facility] for tasks assessed/performed       Past Medical History:  Diagnosis Date  . Anxiety   . Asthma   . External hemorrhoids without mention of complication   . GERD (gastroesophageal reflux disease)   . Heart palpitations   . IBS (irritable bowel syndrome)   . Lump or mass in breast   . Palpitations   . PSVT (paroxysmal supraventricular tachycardia) ([place]) 04/12/2012  . Unspecified vitamin D deficiency     Past Surgical History:  Procedure Laterality Date  . ABDOMINAL HYSTERECTOMY     2013  . BREAST BIOPSY Left 2013   core w/clip - neg  . LAPAROSCOPIC TOTAL HYSTERECTOMY      There were no vitals filed for this visit.  Subjective Assessment - 04/23/18 1405    Subjective  Pt. reports no L knee pain today.      Pertinent History  Pt. fell while walking into residents house at [facility].  R foot slipped and pt. fell on L knee/elbow.  Pt. is currently on Light Duty status until 03/19/18.  Pt. using w/c at work and doing seated work.  Pt. arrived to PT with use of axillary crutches.      Limitations  Lifting;Standing;Walking;House hold activities    Patient Stated Goals  return to work with no L knee limitations.  Amb. with no issues.      Currently in Pain?  No/denies         Treatment:  There.ex.:  Scifit L7 12 min. (no breaks/ no c/o fatigue) Recip. Stairs  ascending/ descending 10x with no handrails. Walking lunges in PT clinic (at agility ladder)- 3x Wall squats 20x (90 deg.)- slight hold Walking outside on stairs with recip. Pattern Supine L quad stretches/ sets with 10 sec. Holds. (manual feedback).    PT Long Term Goals - 04/23/18 1356      PT LONG TERM GOAL #1   Title  Pt. will increase FOTO to 49 to improve pain-free mobility/ return to work.      Baseline  FOTO: baseline 26.  Today: 69 (significant improvement).      Time  6    Period  Weeks    Status  Achieved    Target Date  03/14/18      PT LONG TERM GOAL #2   Title  Pt. will increase L knee AROM to >130 deg. to improve pain-free mobility/ walking.     Baseline  L knee AROM: 0 to >140 deg.      Time  6    Period  Weeks    Status  Achieved    Target Date  03/14/18      PT LONG TERM GOAL #3   Title  Pt. will increase L hip/knee strength  to grossly 4+/5 MMT to improve pain-free mobility/ return to work without restriction.      Baseline  R LE strength: 5/5 MMT quad/ hamstring. 4+/5 hip flexion. 5/5 ankle. L LE strength grossly 4+/5 MMT (no pain)    Time  6    Period  Weeks    Status  Achieved    Target Date  03/14/18      PT LONG TERM GOAL #4   Title  Pt. able to ambulate with normalized gait pattern and no assistive device community distances safely.      Baseline  marked improvement in normalized gait with no assistive device (cuing to increase cadence)    Time  6    Period  Weeks    Status  Achieved    Target Date  03/14/18      PT LONG TERM GOAL #5   Title  Pt. able to return to work at [employer] with no L knee pain or limitations.      Baseline  pt. working Light Duty at this time.  Pt. returns to MD in a week to determine RTW status without restriction    Time  4    Period  Weeks    Status  Not Met    Target Date  05/02/18      Additional Long Term Goals   Additional Long Term Goals  Yes      PT LONG TERM GOAL #6   Title  Pt. able to lift  20# box from floor to waist with proper body mechanics and no L knee pain to promote return to work.      Baseline  Slight increase in L knee pain with knee flexoin/ lifting    Time  4    Period  Weeks    Status  New    Target Date  05/02/18            Plan - 04/23/18 1405    Clinical Impression Statement  Good L LE muscle endurance noted during Scifit ex. today.  Minimal rest breaks required and pt. able to complete 20x wall squats to 90 deg. flexion with no increase c/o pain.  Pt. ascends stairs with recip. pattern and no handrails.      Clinical Presentation  Stable    Clinical Decision Making  Low    Rehab Potential  Excellent    PT Frequency  2x / week    PT Duration  4 weeks    PT Treatment/Interventions  ADLs/Self Care Home Management;Aquatic Therapy;Cryotherapy;Electrical Stimulation;Moist Heat;Balance training;Therapeutic exercise;Therapeutic activities;Functional mobility training;Stair training;Gait training;Neuromuscular re-education;Patient/family education;Manual techniques;Passive range of motion;Dry needling;Taping    PT Next Visit Plan  Progress strengthening/ overall functional mobility to promote return to work with no limitations.      PT Home Exercise Plan  See HEP    Consulted and Agree with Plan of Care  Patient       Patient will benefit from skilled therapeutic intervention in order to improve the following deficits and impairments:  Abnormal gait, Improper body mechanics, Pain, Decreased mobility, Decreased activity tolerance, Decreased endurance, Decreased range of motion, Decreased strength, Hypomobility, Impaired flexibility, Difficulty walking, Decreased balance  Visit Diagnosis: Acute pain of left knee  Joint stiffness of knee, left  Muscle weakness (generalized)  Gait difficulty     Problem List Patient Active Problem List   Diagnosis Date Noted  . History of shingles 04/17/2017  . Post-menopause on HRT (hormone replacement therapy)  11/05/2015  .  Fasting hyperglycemia 08/10/2015  . Anxiety, generalized 03/20/2015  . Bradycardia 03/20/2015  . Chronic tension-type headache, intractable 03/20/2015  . Gastro-esophageal reflux disease without esophagitis 03/20/2015  . IBS (irritable bowel syndrome) 03/20/2015  . Menopause 03/20/2015  . Vitamin D deficiency 03/20/2015  . Vasovagal syncope 08/18/2014  . PSVT (paroxysmal supraventricular tachycardia) ([place]) 04/12/2012  . External hemorrhoids 07/15/2007   Pura Spice, PT, DPT # [phone] 04/23/2018, 2:07 PM  [facility] [address] [place], [place], [address] Phone: [phone]   Fax:  [phone]  Name: Chelsea Decker MRN: [id number] Date of Birth: 07/26/1961

## 2018-04-12 NOTE — Therapy (Addendum)
[facility] [address] [place], [place], [address] Phone: [phone]   Fax:  [phone]  Physical Therapy Treatment  Patient Details  Name: Chelsea ESTABROOK MRN: [id number] Date of Birth: 1960-10-09 Referring Provider: Dr. Harlow Mares   Encounter Date: 04/11/2018  PT End of Session - 04/23/18 1415    Visit Number  15    Number of Visits  20    Date for PT Re-Evaluation  05/02/18    PT Start Time  1508    PT Stop Time  1604    PT Time Calculation (min)  56 min    Activity Tolerance  Patient tolerated treatment well    Behavior During Therapy  [facility] for tasks assessed/performed       Past Medical History:  Diagnosis Date  . Anxiety   . Asthma   . External hemorrhoids without mention of complication   . GERD (gastroesophageal reflux disease)   . Heart palpitations   . IBS (irritable bowel syndrome)   . Lump or mass in breast   . Palpitations   . PSVT (paroxysmal supraventricular tachycardia) ([place]) 04/12/2012  . Unspecified vitamin D deficiency     Past Surgical History:  Procedure Laterality Date  . ABDOMINAL HYSTERECTOMY     2013  . BREAST BIOPSY Left 2013   core w/clip - neg  . LAPAROSCOPIC TOTAL HYSTERECTOMY      There were no vitals filed for this visit.  Subjective Assessment - 04/23/18 1413    Subjective  Pt. states she returns to MD on 9/17.  Pt. reports no new issues.  Pt. still using w/c at work.      Pertinent History  Pt. fell while walking into residents house at [facility].  R foot slipped and pt. fell on L knee/elbow.  Pt. is currently on Light Duty status until 03/19/18.  Pt. using w/c at work and doing seated work.  Pt. arrived to PT with use of axillary crutches.      Limitations  Lifting;Standing;Walking;House hold activities    Patient Stated Goals  return to work with no L knee limitations.  Amb. with no issues.      Currently in Pain?  No/denies         There.ex.:  Supine LE  stretches (7 min.) Scifit L7 12 min. B UE/LE (no charge) TG 30x bilateral/ 20x single leg/ heel raises Kneeling at //-bars with return to standing (light to no UE assist)- SBA Recip. Stairs with no UE assist TM walking 2.0 mph (0-10%)- 10 min. Standing marching/ hip abduction    PT Long Term Goals - 04/23/18 1356      PT LONG TERM GOAL #1   Title  Pt. will increase FOTO to 49 to improve pain-free mobility/ return to work.      Baseline  FOTO: baseline 26.  Today: 69 (significant improvement).      Time  6    Period  Weeks    Status  Achieved    Target Date  03/14/18      PT LONG TERM GOAL #2   Title  Pt. will increase L knee AROM to >130 deg. to improve pain-free mobility/ walking.     Baseline  L knee AROM: 0 to >140 deg.      Time  6    Period  Weeks    Status  Achieved    Target Date  03/14/18      PT LONG TERM  GOAL #3   Title  Pt. will increase L hip/knee strength to grossly 4+/5 MMT to improve pain-free mobility/ return to work without restriction.      Baseline  R LE strength: 5/5 MMT quad/ hamstring. 4+/5 hip flexion. 5/5 ankle. L LE strength grossly 4+/5 MMT (no pain)    Time  6    Period  Weeks    Status  Achieved    Target Date  03/14/18      PT LONG TERM GOAL #4   Title  Pt. able to ambulate with normalized gait pattern and no assistive device community distances safely.      Baseline  marked improvement in normalized gait with no assistive device (cuing to increase cadence)    Time  6    Period  Weeks    Status  Achieved    Target Date  03/14/18      PT LONG TERM GOAL #5   Title  Pt. able to return to work at [employer] with no L knee pain or limitations.      Baseline  pt. working Light Duty at this time.  Pt. returns to MD in a week to determine RTW status without restriction    Time  4    Period  Weeks    Status  Not Met    Target Date  05/02/18      Additional Long Term Goals   Additional Long Term Goals  Yes      PT LONG TERM GOAL #6    Title  Pt. able to lift 20# box from floor to waist with proper body mechanics and no L knee pain to promote return to work.      Baseline  Slight increase in L knee pain with knee flexoin/ lifting    Time  4    Period  Weeks    Status  New    Target Date  05/02/18          Plan - 04/23/18 1415    Clinical Impression Statement  Pt. able to maintain a consistent gait pattern at 2.0 mph with 0-10% incline and no L knee pain.  PT focusing on quad strengthening and proper lifting/ body mechanics with work-related tasks.  Pt. able to kneel in front of //-bars with L knee up and return to stand with minimal to no UE assist.      Clinical Presentation  Stable    Clinical Decision Making  Low    Rehab Potential  Excellent    PT Frequency  2x / week    PT Duration  4 weeks    PT Treatment/Interventions  ADLs/Self Care Home Management;Aquatic Therapy;Cryotherapy;Electrical Stimulation;Moist Heat;Balance training;Therapeutic exercise;Therapeutic activities;Functional mobility training;Stair training;Gait training;Neuromuscular re-education;Patient/family education;Manual techniques;Passive range of motion;Dry needling;Taping    PT Next Visit Plan  Progress strengthening/ overall functional mobility to promote return to work with no limitations.      PT Home Exercise Plan  See HEP       Patient will benefit from skilled therapeutic intervention in order to improve the following deficits and impairments:  Abnormal gait, Improper body mechanics, Pain, Decreased mobility, Decreased activity tolerance, Decreased endurance, Decreased range of motion, Decreased strength, Hypomobility, Impaired flexibility, Difficulty walking, Decreased balance  Visit Diagnosis: Acute pain of left knee  Joint stiffness of knee, left  Muscle weakness (generalized)  Gait difficulty     Problem List Patient Active Problem List   Diagnosis Date Noted  . History of  shingles 04/17/2017  . Post-menopause on HRT  (hormone replacement therapy) 11/05/2015  . Fasting hyperglycemia 08/10/2015  . Anxiety, generalized 03/20/2015  . Bradycardia 03/20/2015  . Chronic tension-type headache, intractable 03/20/2015  . Gastro-esophageal reflux disease without esophagitis 03/20/2015  . IBS (irritable bowel syndrome) 03/20/2015  . Menopause 03/20/2015  . Vitamin D deficiency 03/20/2015  . Vasovagal syncope 08/18/2014  . PSVT (paroxysmal supraventricular tachycardia) ([place]) 04/12/2012  . External hemorrhoids 07/15/2007   Pura Spice, PT, DPT # [phone] 04/23/2018, 2:17 PM  [facility] [address] [place], [place], [address] Phone: [phone]   Fax:  [phone]  Name: ROSHAN SALAMON MRN: [id number] Date of Birth: 02/18/1961

## 2018-04-16 ENCOUNTER — Ambulatory Visit: Payer: PRIVATE HEALTH INSURANCE | Attending: Orthopedic Surgery | Admitting: Physical Therapy

## 2018-04-16 DIAGNOSIS — M6281 Muscle weakness (generalized): Secondary | ICD-10-CM

## 2018-04-16 DIAGNOSIS — M25662 Stiffness of left knee, not elsewhere classified: Secondary | ICD-10-CM | POA: Diagnosis present

## 2018-04-16 DIAGNOSIS — M25562 Pain in left knee: Secondary | ICD-10-CM | POA: Diagnosis not present

## 2018-04-16 DIAGNOSIS — R269 Unspecified abnormalities of gait and mobility: Secondary | ICD-10-CM

## 2018-04-18 ENCOUNTER — Ambulatory Visit: Payer: PRIVATE HEALTH INSURANCE | Admitting: Physical Therapy

## 2018-04-18 DIAGNOSIS — M25562 Pain in left knee: Secondary | ICD-10-CM

## 2018-04-18 DIAGNOSIS — M6281 Muscle weakness (generalized): Secondary | ICD-10-CM

## 2018-04-18 DIAGNOSIS — R269 Unspecified abnormalities of gait and mobility: Secondary | ICD-10-CM

## 2018-04-18 DIAGNOSIS — M25662 Stiffness of left knee, not elsewhere classified: Secondary | ICD-10-CM

## 2018-04-23 ENCOUNTER — Encounter: Payer: Self-pay | Admitting: Physical Therapy

## 2018-04-23 ENCOUNTER — Ambulatory Visit: Payer: PRIVATE HEALTH INSURANCE | Admitting: Physical Therapy

## 2018-04-23 DIAGNOSIS — M25562 Pain in left knee: Secondary | ICD-10-CM | POA: Diagnosis not present

## 2018-04-23 DIAGNOSIS — M6281 Muscle weakness (generalized): Secondary | ICD-10-CM

## 2018-04-23 DIAGNOSIS — M25662 Stiffness of left knee, not elsewhere classified: Secondary | ICD-10-CM

## 2018-04-23 DIAGNOSIS — R269 Unspecified abnormalities of gait and mobility: Secondary | ICD-10-CM

## 2018-04-23 NOTE — Therapy (Addendum)
[facility] [address] [place], [place], [address] Phone: [phone]   Fax:  [phone]  Physical Therapy Treatment  Patient Details  Name: Chelsea Decker MRN: [id number] Date of Birth: 1961-06-22 Referring Provider: Dr. Harlow Mares   Encounter Date: 04/23/2018  PT End of Session - 04/29/18 1620    Visit Number  18    Number of Visits  20    Date for PT Re-Evaluation  05/02/18    PT Start Time  1508    PT Stop Time  1604    PT Time Calculation (min)  56 min    Activity Tolerance  Patient tolerated treatment well    Behavior During Therapy  [facility] for tasks assessed/performed       Past Medical History:  Diagnosis Date  . Anxiety   . Asthma   . External hemorrhoids without mention of complication   . GERD (gastroesophageal reflux disease)   . Heart palpitations   . IBS (irritable bowel syndrome)   . Lump or mass in breast   . Palpitations   . PSVT (paroxysmal supraventricular tachycardia) ([place]) 04/12/2012  . Unspecified vitamin D deficiency     Past Surgical History:  Procedure Laterality Date  . ABDOMINAL HYSTERECTOMY     2013  . BREAST BIOPSY Left 2013   core w/clip - neg  . LAPAROSCOPIC TOTAL HYSTERECTOMY      There were no vitals filed for this visit.  Subjective Assessment - 04/29/18 1620    Pertinent History  Pt. fell while walking into residents house at [facility].  R foot slipped and pt. fell on L knee/elbow.  Pt. is currently on Light Duty status until 03/19/18.  Pt. using w/c at work and doing seated work.  Pt. arrived to PT with use of axillary crutches.      Limitations  Lifting;Standing;Walking;House hold activities    Patient Stated Goals  return to work with no L knee limitations.  Amb. with no issues.      Currently in Pain?  No/denies      No new complaints.       Treatment:  There Ex:  SciFit - 12 min L7 (unbilled) TG knee flexion (toe in/out/mid-line) 20x / single leg 20x2  each. Resisted walking with Nautilus 95#  (Front/Back/Lateral) x5 each Standing wall squats with green yoga ball x5 Sled Push/Pull down hallway 70# x5 each direction Standing Box lift/squats 25# box x10 Step ups downs (varying heights in PT clinic)- maintain knee in midline.        PT Long Term Goals - 04/23/18 1356      PT LONG TERM GOAL #1   Title  Pt. will increase FOTO to 49 to improve pain-free mobility/ return to work.      Baseline  FOTO: baseline 26.  Today: 69 (significant improvement).      Time  6    Period  Weeks    Status  Achieved    Target Date  03/14/18      PT LONG TERM GOAL #2   Title  Pt. will increase L knee AROM to >130 deg. to improve pain-free mobility/ walking.     Baseline  L knee AROM: 0 to >140 deg.      Time  6    Period  Weeks    Status  Achieved    Target Date  03/14/18      PT LONG TERM GOAL #3   Title  Pt. will increase L hip/knee strength to grossly 4+/5 MMT to improve pain-free mobility/ return to work without restriction.      Baseline  R LE strength: 5/5 MMT quad/ hamstring. 4+/5 hip flexion. 5/5 ankle. L LE strength grossly 4+/5 MMT (no pain)    Time  6    Period  Weeks    Status  Achieved    Target Date  03/14/18      PT LONG TERM GOAL #4   Title  Pt. able to ambulate with normalized gait pattern and no assistive device community distances safely.      Baseline  marked improvement in normalized gait with no assistive device (cuing to increase cadence)    Time  6    Period  Weeks    Status  Achieved    Target Date  03/14/18      PT LONG TERM GOAL #5   Title  Pt. able to return to work at [employer] with no L knee pain or limitations.      Baseline  pt. working Light Duty at this time.  Pt. returns to MD in a week to determine RTW status without restriction    Time  4    Period  Weeks    Status  Not Met    Target Date  05/02/18      Additional Long Term Goals   Additional Long Term Goals  Yes      PT LONG TERM GOAL  #6   Title  Pt. able to lift 20# box from floor to waist with proper body mechanics and no L knee pain to promote return to work.      Baseline  Slight increase in L knee pain with knee flexoin/ lifting    Time  4    Period  Weeks    Status  New    Target Date  05/02/18         Plan - 04/29/18 1620    Clinical Impression Statement  Pt. continues to progress well towards all PT goals with no limitations.  Pt. presents with full L knee AROM with no increase c/o pain.  Pt. understands importance of continuing with HEP, even after completion of PT tx. sessions.  Pt. able to maintain neutral knee posture with step ups/ downs on varying heights of stairs.      Clinical Presentation  Stable    Clinical Decision Making  Low    Rehab Potential  Excellent    PT Frequency  2x / week    PT Duration  4 weeks    PT Treatment/Interventions  ADLs/Self Care Home Management;Aquatic Therapy;Cryotherapy;Electrical Stimulation;Moist Heat;Balance training;Therapeutic exercise;Therapeutic activities;Functional mobility training;Stair training;Gait training;Neuromuscular re-education;Patient/family education;Manual techniques;Passive range of motion;Dry needling;Taping    PT Next Visit Plan  Progress strengthening/ overall functional mobility to promote return to work with no limitations.  Discharge next tx. sessoin.      PT Home Exercise Plan  See HEP       Patient will benefit from skilled therapeutic intervention in order to improve the following deficits and impairments:  Abnormal gait, Improper body mechanics, Pain, Decreased mobility, Decreased activity tolerance, Decreased endurance, Decreased range of motion, Decreased strength, Hypomobility, Impaired flexibility, Difficulty walking, Decreased balance  Visit Diagnosis: Acute pain of left knee  Joint stiffness of knee, left  Muscle weakness (generalized)  Gait difficulty     Problem List Patient Active Problem List   Diagnosis Date Noted  .  History of  shingles 04/17/2017  . Post-menopause on HRT (hormone replacement therapy) 11/05/2015  . Fasting hyperglycemia 08/10/2015  . Anxiety, generalized 03/20/2015  . Bradycardia 03/20/2015  . Chronic tension-type headache, intractable 03/20/2015  . Gastro-esophageal reflux disease without esophagitis 03/20/2015  . IBS (irritable bowel syndrome) 03/20/2015  . Menopause 03/20/2015  . Vitamin D deficiency 03/20/2015  . Vasovagal syncope 08/18/2014  . PSVT (paroxysmal supraventricular tachycardia) ([place]) 04/12/2012  . External hemorrhoids 07/15/2007   Pura Spice, PT, DPT # [id number] 04/29/2018, 4:26 PM  [facility] [address] [place], [place], [address] Phone: [phone]   Fax:  [phone]  Name: TANZIE ROTHSCHILD MRN: [id number] Date of Birth: 06-Jun-1961

## 2018-04-23 NOTE — Addendum Note (Signed)
Addended by: [name] on: 04/23/2018 02:04 PM   Modules accepted: Orders

## 2018-04-25 ENCOUNTER — Ambulatory Visit: Payer: PRIVATE HEALTH INSURANCE | Admitting: Physical Therapy

## 2018-04-25 DIAGNOSIS — R269 Unspecified abnormalities of gait and mobility: Secondary | ICD-10-CM

## 2018-04-25 DIAGNOSIS — M6281 Muscle weakness (generalized): Secondary | ICD-10-CM

## 2018-04-25 DIAGNOSIS — M25562 Pain in left knee: Secondary | ICD-10-CM

## 2018-04-25 DIAGNOSIS — M25662 Stiffness of left knee, not elsewhere classified: Secondary | ICD-10-CM

## 2018-04-27 ENCOUNTER — Encounter: Payer: Self-pay | Admitting: Physical Therapy

## 2018-04-27 NOTE — Therapy (Addendum)
[facility] [address] [place], [place], [address] Phone: [phone]   Fax:  [phone]  Physical Therapy Treatment  Patient Details  Name: Chelsea Decker MRN: [id number] Date of Birth: [birth year] Referring Provider: Dr. Harlow Mares   Encounter Date: 04/18/2018  PT End of Session - 04/29/18 1610    Visit Number  17    Number of Visits  20    Date for PT Re-Evaluation  05/02/18    PT Start Time  1502    PT Stop Time  1601    PT Time Calculation (min)  59 min    Activity Tolerance  Patient tolerated treatment well    Behavior During Therapy  [facility] for tasks assessed/performed       Past Medical History:  Diagnosis Date  . Anxiety   . Asthma   . External hemorrhoids without mention of complication   . GERD (gastroesophageal reflux disease)   . Heart palpitations   . IBS (irritable bowel syndrome)   . Lump or mass in breast   . Palpitations   . PSVT (paroxysmal supraventricular tachycardia) ([place]) 04/12/2012  . Unspecified vitamin D deficiency     Past Surgical History:  Procedure Laterality Date  . ABDOMINAL HYSTERECTOMY     2013  . BREAST BIOPSY Left 2013   core w/clip - neg  . LAPAROSCOPIC TOTAL HYSTERECTOMY      There were no vitals filed for this visit.  Subjective Assessment - 04/29/18 1609    Subjective  Pt. worked in w/c today but was able to walk around during breaks.  No pain in L knee but slight tenderness in L knee joint line with palpation.      Pertinent History  Pt. fell while walking into residents house at [facility].  R foot slipped and pt. fell on L knee/elbow.  Pt. is currently on Light Duty status until 03/19/18.  Pt. using w/c at work and doing seated work.  Pt. arrived to PT with use of axillary crutches.      Limitations  Lifting;Standing;Walking;House hold activities    Patient Stated Goals  return to work with no L knee limitations.  Amb. with no issues.      Currently in Pain?   No/denies          There.ex.:  Scifit L7 12 min. B UE/LE (warm-up/no charge) TG 30x bilateral/ 20x single leg/ heel raises BOSU step ups (forward/ lateral)- mirror feedback 10x2 on L and R Tandem/ SLS with rebounder (8x is best) Supine LE stretches (hamstring/ piriformis/ quad)- 4x each Nautilus: 110# walk outs forward/ backwards/ lateral (SBA for cuing)- 5x each. 6"/12" step ups on L 10x    PT Long Term Goals - 04/23/18 1356      PT LONG TERM GOAL #1   Title  Pt. will increase FOTO to 49 to improve pain-free mobility/ return to work.      Baseline  FOTO: baseline 26.  Today: 69 (significant improvement).      Time  6    Period  Weeks    Status  Achieved    Target Date  03/14/18      PT LONG TERM GOAL #2   Title  Pt. will increase L knee AROM to >130 deg. to improve pain-free mobility/ walking.     Baseline  L knee AROM: 0 to >140 deg.      Time  6    Period  Weeks  Status  Achieved    Target Date  03/14/18      PT LONG TERM GOAL #3   Title  Pt. will increase L hip/knee strength to grossly 4+/5 MMT to improve pain-free mobility/ return to work without restriction.      Baseline  R LE strength: 5/5 MMT quad/ hamstring. 4+/5 hip flexion. 5/5 ankle. L LE strength grossly 4+/5 MMT (no pain)    Time  6    Period  Weeks    Status  Achieved    Target Date  03/14/18      PT LONG TERM GOAL #4   Title  Pt. able to ambulate with normalized gait pattern and no assistive device community distances safely.      Baseline  marked improvement in normalized gait with no assistive device (cuing to increase cadence)    Time  6    Period  Weeks    Status  Achieved    Target Date  03/14/18      PT LONG TERM GOAL #5   Title  Pt. able to return to work at [employer] with no L knee pain or limitations.      Baseline  pt. working Light Duty at this time.  Pt. returns to MD in a week to determine RTW status without restriction    Time  4    Period  Weeks    Status  Not Met     Target Date  05/02/18      Additional Long Term Goals   Additional Long Term Goals  Yes      PT LONG TERM GOAL #6   Title  Pt. able to lift 20# box from floor to waist with proper body mechanics and no L knee pain to promote return to work.      Baseline  Slight increase in L knee pain with knee flexoin/ lifting    Time  4    Period  Weeks    Status  New    Target Date  05/02/18         Plan - 04/29/18 1611    Clinical Impression Statement  Good overall B LE muscle endurance with Scifit and dynamic LE balance tasks.  Pt. able to complete 8x SLS with rebounder before correction.  Pt. works hard during tx. session and is excited about being more active with less L knee pain/limitations.      Clinical Presentation  Stable    Clinical Decision Making  Low    Rehab Potential  Excellent    PT Frequency  2x / week    PT Duration  4 weeks    PT Treatment/Interventions  ADLs/Self Care Home Management;Aquatic Therapy;Cryotherapy;Electrical Stimulation;Moist Heat;Balance training;Therapeutic exercise;Therapeutic activities;Functional mobility training;Stair training;Gait training;Neuromuscular re-education;Patient/family education;Manual techniques;Passive range of motion;Dry needling;Taping    PT Next Visit Plan  Progress strengthening/ overall functional mobility to promote return to work with no limitations.      PT Home Exercise Plan  See HEP    Consulted and Agree with Plan of Care  Patient       Patient will benefit from skilled therapeutic intervention in order to improve the following deficits and impairments:  Abnormal gait, Improper body mechanics, Pain, Decreased mobility, Decreased activity tolerance, Decreased endurance, Decreased range of motion, Decreased strength, Hypomobility, Impaired flexibility, Difficulty walking, Decreased balance  Visit Diagnosis: Acute pain of left knee  Joint stiffness of knee, left  Muscle weakness (generalized)  Gait  difficulty  Problem List Patient Active Problem List   Diagnosis Date Noted  . History of shingles 04/17/2017  . Post-menopause on HRT (hormone replacement therapy) 11/05/2015  . Fasting hyperglycemia 08/10/2015  . Anxiety, generalized 03/20/2015  . Bradycardia 03/20/2015  . Chronic tension-type headache, intractable 03/20/2015  . Gastro-esophageal reflux disease without esophagitis 03/20/2015  . IBS (irritable bowel syndrome) 03/20/2015  . Menopause 03/20/2015  . Vitamin D deficiency 03/20/2015  . Vasovagal syncope 08/18/2014  . PSVT (paroxysmal supraventricular tachycardia) ([place]) 04/12/2012  . External hemorrhoids 07/15/2007   Pura Spice, PT, DPT # [id number] 04/29/2018, 4:15 PM  [facility] [address] [place], [place], [address] Phone: [phone]   Fax:  [phone]  Name: Chelsea Decker MRN: [id number] Date of Birth: 1961-05-04

## 2018-04-27 NOTE — Therapy (Addendum)
[facility] [address] [place], [place], [address] Phone: [phone]   Fax:  [phone]  Physical Therapy Treatment  Patient Details  Name: Chelsea Decker MRN: [id number] Date of Birth: 1961-01-02 Referring Provider: Dr. Harlow Mares   Encounter Date: 04/16/2018  PT End of Session - 04/29/18 1542    Visit Number  16    Number of Visits  20    Date for PT Re-Evaluation  05/02/18    PT Start Time  1504    PT Stop Time  1601    PT Time Calculation (min)  57 min    Activity Tolerance  Patient tolerated treatment well       Past Medical History:  Diagnosis Date  . Anxiety   . Asthma   . External hemorrhoids without mention of complication   . GERD (gastroesophageal reflux disease)   . Heart palpitations   . IBS (irritable bowel syndrome)   . Lump or mass in breast   . Palpitations   . PSVT (paroxysmal supraventricular tachycardia) ([place]) 04/12/2012  . Unspecified vitamin D deficiency     Past Surgical History:  Procedure Laterality Date  . ABDOMINAL HYSTERECTOMY     2013  . BREAST BIOPSY Left 2013   core w/clip - neg  . LAPAROSCOPIC TOTAL HYSTERECTOMY      There were no vitals filed for this visit.      Pt. reports no new issues. Pt. states she has >10,000 steps on Fitbit today.          There.ex.:  Supine LE stretches (10 min.) Scifit L7 12 min. B UE/LE (no charge) TG 30x bilateral/ 20x single leg/ heel raises Sled push/pull 70# 4x45 feet Recip. Stairs with no UE assist 10x Standing marching/ hip abduction/ knee flexion/ heel raises 20x. Lunges (forward/ backwards)- 5x each BOSU step ups L 10x.       Pt. ambulates with consistent gait pattern/ heel strike and recip. step pattern. No pain reported during step ups/ squats on TG. Good patellar tracking/ quad muscle activation.   PT Long Term Goals - 04/23/18 1356      PT LONG TERM GOAL #1   Title  Pt. will increase FOTO to 49 to improve  pain-free mobility/ return to work.      Baseline  FOTO: baseline 26.  Today: 69 (significant improvement).      Time  6    Period  Weeks    Status  Achieved    Target Date  03/14/18      PT LONG TERM GOAL #2   Title  Pt. will increase L knee AROM to >130 deg. to improve pain-free mobility/ walking.     Baseline  L knee AROM: 0 to >140 deg.      Time  6    Period  Weeks    Status  Achieved    Target Date  03/14/18      PT LONG TERM GOAL #3   Title  Pt. will increase L hip/knee strength to grossly 4+/5 MMT to improve pain-free mobility/ return to work without restriction.      Baseline  R LE strength: 5/5 MMT quad/ hamstring. 4+/5 hip flexion. 5/5 ankle. L LE strength grossly 4+/5 MMT (no pain)    Time  6    Period  Weeks    Status  Achieved    Target Date  03/14/18      PT LONG TERM GOAL #4  Title  Pt. able to ambulate with normalized gait pattern and no assistive device community distances safely.      Baseline  marked improvement in normalized gait with no assistive device (cuing to increase cadence)    Time  6    Period  Weeks    Status  Achieved    Target Date  03/14/18      PT LONG TERM GOAL #5   Title  Pt. able to return to work at [employer] with no L knee pain or limitations.      Baseline  pt. working Light Duty at this time.  Pt. returns to MD in a week to determine RTW status without restriction    Time  4    Period  Weeks    Status  Not Met    Target Date  05/02/18      Additional Long Term Goals   Additional Long Term Goals  Yes      PT LONG TERM GOAL #6   Title  Pt. able to lift 20# box from floor to waist with proper body mechanics and no L knee pain to promote return to work.      Baseline  Slight increase in L knee pain with knee flexoin/ lifting    Time  4    Period  Weeks    Status  New    Target Date  05/02/18            Plan - 04/29/18 1559    Clinical Impression Statement  Pt. ambulates with consistent gait pattern/ heel  strike and recip. step pattern.  No pain reported during step ups/ squats on TG.  Good patellar tracking/ quad muscle activation.      Clinical Presentation  Stable    Clinical Decision Making  Low    Rehab Potential  Excellent    PT Frequency  2x / week    PT Duration  4 weeks    PT Treatment/Interventions  ADLs/Self Care Home Management;Aquatic Therapy;Cryotherapy;Electrical Stimulation;Moist Heat;Balance training;Therapeutic exercise;Therapeutic activities;Functional mobility training;Stair training;Gait training;Neuromuscular re-education;Patient/family education;Manual techniques;Passive range of motion;Dry needling;Taping    PT Next Visit Plan  Progress strengthening/ overall functional mobility to promote return to work with no limitations.      PT Home Exercise Plan  See HEP    Consulted and Agree with Plan of Care  Patient       Patient will benefit from skilled therapeutic intervention in order to improve the following deficits and impairments:  Abnormal gait, Improper body mechanics, Pain, Decreased mobility, Decreased activity tolerance, Decreased endurance, Decreased range of motion, Decreased strength, Hypomobility, Impaired flexibility, Difficulty walking, Decreased balance  Visit Diagnosis: Acute pain of left knee  Joint stiffness of knee, left  Muscle weakness (generalized)  Gait difficulty     Problem List Patient Active Problem List   Diagnosis Date Noted  . History of shingles 04/17/2017  . Post-menopause on HRT (hormone replacement therapy) 11/05/2015  . Fasting hyperglycemia 08/10/2015  . Anxiety, generalized 03/20/2015  . Bradycardia 03/20/2015  . Chronic tension-type headache, intractable 03/20/2015  . Gastro-esophageal reflux disease without esophagitis 03/20/2015  . IBS (irritable bowel syndrome) 03/20/2015  . Menopause 03/20/2015  . Vitamin D deficiency 03/20/2015  . Vasovagal syncope 08/18/2014  . PSVT (paroxysmal supraventricular tachycardia)  ([place]) 04/12/2012  . External hemorrhoids 07/15/2007   Pura Spice, PT, DPT # [id number] 04/29/2018, 4:05 PM  [place] [facility] [address] [place],  [place], [address] Phone: [phone]   Fax:  [phone]  Name: Chelsea Decker MRN: [id number] Date of Birth: Sep 04, 1960

## 2018-04-27 NOTE — Therapy (Addendum)
[facility] [address] [place], [place], [address] Phone: [phone]   Fax:  [phone]  Physical Therapy Treatment  Patient Details  Name: Chelsea Decker MRN: [id number] Date of Birth: [birth year] Referring Provider: Dr. Odis LusterBowers   Encounter Date: 04/25/2018  PT End of Session - 04/29/18 1633    Visit Number  19    Number of Visits  20    Date for PT Re-Evaluation  05/02/18    PT Start Time  1510    PT Stop Time  1605    PT Time Calculation (min)  55 min    Activity Tolerance  Patient tolerated treatment well    Behavior During Therapy  [facility] for tasks assessed/performed       Past Medical History:  Diagnosis Date  . Anxiety   . Asthma   . External hemorrhoids without mention of complication   . GERD (gastroesophageal reflux disease)   . Heart palpitations   . IBS (irritable bowel syndrome)   . Lump or mass in breast   . Palpitations   . PSVT (paroxysmal supraventricular tachycardia) (HCC) 04/12/2012  . Unspecified vitamin D deficiency     Past Surgical History:  Procedure Laterality Date  . ABDOMINAL HYSTERECTOMY     2013  . BREAST BIOPSY Left 2013   core w/clip - neg  . LAPAROSCOPIC TOTAL HYSTERECTOMY      There were no vitals filed for this visit.  Subjective Assessment - 04/29/18 1632    Subjective  Pt. states she is doing well and ready to get back to work with no limitations/ restrictions.  No pain in L knee at this time.      Pertinent History  Pt. [facility]  R foot slipped and pt. fell on L knee/elbow.  Pt. is currently on Light Duty status until 03/19/18.  Pt. using w/c at work and doing seated work.  Pt. arrived to PT with use of axillary crutches.      Limitations  Lifting;Standing;Walking;House hold activities    Patient Stated Goals  return to work with no L knee limitations.  Amb. with no issues.      Currently in Pain?  No/denies           Treatment:  There Ex:  SciFit - 12 min L7 (warm-up/unbilled) TG knee flexion (toe in/out/mid-line)20x/ single leg 20x2 each. Resisted walking with Nautilus 95#  (Front/Back/Lateral) x5 each Standing wall squats with green yoga ball x5 Sled Push/Pull down hallway 70# x5 each direction Standing Box lift/squats 25# box x10 Step ups downs (varying heights in PT clinic)- maintain knee in midline.   Reviewed HEP in depth.       PT Long Term Goals - 04/29/18 1639      PT LONG TERM GOAL #1   Title  Pt. will increase FOTO to 49 to improve pain-free mobility/ return to work.      Baseline  FOTO: baseline 26.  Today: 69 (significant improvement).      Time  6    Period  Weeks    Status  Achieved    Target Date  03/14/18      PT LONG TERM GOAL #2   Title  Pt. will increase L knee AROM to >130 deg. to improve pain-free mobility/ walking.     Baseline  L knee AROM: 0 to >140 deg.      Time  6  Period  Weeks    Status  Achieved    Target Date  03/14/18      PT LONG TERM GOAL #3   Title  Pt. will increase L hip/knee strength to grossly 4+/5 MMT to improve pain-free mobility/ return to work without restriction.      Baseline  R LE strength: 5/5 MMT quad/ hamstring. 4+/5 hip flexion. 5/5 ankle. L LE strength grossly 4+/5 MMT (no pain)    Time  6    Period  Weeks    Status  Achieved    Target Date  03/14/18      PT LONG TERM GOAL #4   Title  Pt. able to ambulate with normalized gait pattern and no assistive device community distances safely.      Baseline  marked improvement in normalized gait with no assistive device (cuing to increase cadence)    Time  6    Period  Weeks    Status  Achieved    Target Date  03/14/18      PT LONG TERM GOAL #5   Title  Pt. able to return to work at [employer] with no L knee pain or limitations.      Baseline  No limitations    Time  4    Period  Weeks    Status  Achieved    Target Date  04/25/18      PT LONG TERM GOAL  #6   Title  Pt. able to lift 20# box from floor to waist with proper body mechanics and no L knee pain to promote return to work.      Baseline  no limitations with good technique.      Time  4    Period  Weeks    Status  Achieved    Target Date  04/25/18         Plan - 04/29/18 1633    Clinical Impression Statement  Pt. has progressed well towards all PT goals at this time.  No L knee pain or limitations noted at this time.  Pt. ambulates with a normalized gait pattern in PT clinic with consistent step pattern/ heel strike.  Pt. independent with HEP at this time.  Discharge from PT with return to work with no limitations.      Clinical Presentation  Stable    Clinical Decision Making  Low    Rehab Potential  Excellent    PT Frequency  2x / week    PT Duration  4 weeks    PT Treatment/Interventions  ADLs/Self Care Home Management;Aquatic Therapy;Cryotherapy;Electrical Stimulation;Moist Heat;Balance training;Therapeutic exercise;Therapeutic activities;Functional mobility training;Stair training;Gait training;Neuromuscular re-education;Patient/family education;Manual techniques;Passive range of motion;Dry needling;Taping    PT Next Visit Plan  Discharge visit.  Pt. instructed to contact PT if any issues.      PT Home Exercise Plan  See HEP       Patient will benefit from skilled therapeutic intervention in order to improve the following deficits and impairments:  Abnormal gait, Improper body mechanics, Pain, Decreased mobility, Decreased activity tolerance, Decreased endurance, Decreased range of motion, Decreased strength, Hypomobility, Impaired flexibility, Difficulty walking, Decreased balance  Visit Diagnosis: Acute pain of left knee  Joint stiffness of knee, left  Muscle weakness (generalized)  Gait difficulty     Problem List Patient Active Problem List   Diagnosis Date Noted  . History of shingles 04/17/2017  . Post-menopause on HRT (hormone replacement therapy)  11/05/2015  . Fasting hyperglycemia 08/10/2015  .  Anxiety, generalized 03/20/2015  . Bradycardia 03/20/2015  . Chronic tension-type headache, intractable 03/20/2015  . Gastro-esophageal reflux disease without esophagitis 03/20/2015  . IBS (irritable bowel syndrome) 03/20/2015  . Menopause 03/20/2015  . Vitamin D deficiency 03/20/2015  . Vasovagal syncope 08/18/2014  . PSVT (paroxysmal supraventricular tachycardia) (HCC) 04/12/2012  . External hemorrhoids 07/15/2007   [name], PT, DPT # [id number] 04/29/2018, 4:41 PM  [facility] [address] [place], [place], [address] Phone: [phone]   Fax:  [phone]  Name: Chelsea Decker MRN: [id number] Date of Birth: Oct 26, 1960

## 2018-04-29 ENCOUNTER — Encounter: Payer: Self-pay | Admitting: Physical Therapy

## 2018-04-30 ENCOUNTER — Ambulatory Visit: Payer: PRIVATE HEALTH INSURANCE | Admitting: Physical Therapy

## 2018-05-02 ENCOUNTER — Ambulatory Visit: Payer: PRIVATE HEALTH INSURANCE | Admitting: Physical Therapy

## 2018-05-06 ENCOUNTER — Ambulatory Visit: Payer: PRIVATE HEALTH INSURANCE | Admitting: Physical Therapy

## 2018-05-08 ENCOUNTER — Encounter: Payer: PRIVATE HEALTH INSURANCE | Admitting: Physical Therapy

## 2018-06-12 ENCOUNTER — Ambulatory Visit (INDEPENDENT_AMBULATORY_CARE_PROVIDER_SITE_OTHER): Payer: 59 | Admitting: Family Medicine

## 2018-06-12 ENCOUNTER — Encounter: Payer: Self-pay | Admitting: Family Medicine

## 2018-06-12 ENCOUNTER — Other Ambulatory Visit: Payer: Self-pay

## 2018-06-12 VITALS — BP 108/64 | HR 81 | Temp 97.9°F | Resp 16 | Ht 64.0 in | Wt 139.6 lb

## 2018-06-12 DIAGNOSIS — F410 Panic disorder [episodic paroxysmal anxiety] without agoraphobia: Secondary | ICD-10-CM

## 2018-06-12 DIAGNOSIS — R7303 Prediabetes: Secondary | ICD-10-CM

## 2018-06-12 DIAGNOSIS — F411 Generalized anxiety disorder: Secondary | ICD-10-CM | POA: Diagnosis not present

## 2018-06-12 DIAGNOSIS — F320 Major depressive disorder, single episode, mild: Secondary | ICD-10-CM | POA: Diagnosis not present

## 2018-06-12 DIAGNOSIS — G44221 Chronic tension-type headache, intractable: Secondary | ICD-10-CM

## 2018-06-12 DIAGNOSIS — Z1211 Encounter for screening for malignant neoplasm of colon: Secondary | ICD-10-CM

## 2018-06-12 DIAGNOSIS — I471 Supraventricular tachycardia: Secondary | ICD-10-CM | POA: Diagnosis not present

## 2018-06-12 DIAGNOSIS — K219 Gastro-esophageal reflux disease without esophagitis: Secondary | ICD-10-CM

## 2018-06-12 DIAGNOSIS — E559 Vitamin D deficiency, unspecified: Secondary | ICD-10-CM

## 2018-06-12 DIAGNOSIS — Z1231 Encounter for screening mammogram for malignant neoplasm of breast: Secondary | ICD-10-CM

## 2018-06-12 DIAGNOSIS — Z1239 Encounter for other screening for malignant neoplasm of breast: Secondary | ICD-10-CM

## 2018-06-12 MED ORDER — ALPRAZOLAM ER 0.5 MG PO TB24: 0.5000 mg | ORAL_TABLET | Freq: Every day | ORAL | 2 refills | Status: DC

## 2018-06-12 MED ORDER — ESCITALOPRAM OXALATE 10 MG PO TABS: 10.0000 mg | ORAL_TABLET | Freq: Two times a day (BID) | ORAL | 2 refills | Status: DC

## 2018-06-12 NOTE — Addendum Note (Signed)
Addended by: Cynda Familia on: 06/12/2018 04:31 PM   Modules accepted: Orders

## 2018-06-12 NOTE — Progress Notes (Signed)
Name: Chelsea Decker   MRN: [id number]    DOB: 11-28-1960   Date:06/12/2018       Progress Note  Subjective  Chief Complaint  Chief Complaint  Patient presents with  . Medication Refill  . Depression  . Anxiety  . Irritable Bowel Syndrome    Well controlled with Linzess  . Gastroesophageal Reflux  . Genia Hotter on Jan 03, 2018, fractured her left knee cap and was out of work for a couple of weeks. Also did some PT twice weekly    HPI  Tension Headaches: she has episodes of headaches pain is described as a throbbing or sharp sensation, and radiates to right side of neck, sometimes on left side, at times on temporal area. No photophobia or phonophobia, or focal deficits. Lasts about one hour. Episodes of headaches are on average a few times a week, but takes fioricet only when severe and at most twice a week. Still has rx at home   IBS with constipation: symptoms have improved with Linzess, currently not taking medication daily because bowels have been regular, she takes it prn only now.unchanged    GERD:she is back on  Pantoprazole and symptoms are controlled. Denies any regurgitation or reflux symptoms at this time  SVT: seen by Dr. Kirke Corin, stress test in 2013 showed HR went up to 220 with maximum activity, she is avoiding chocolate and caffeine .She is on Metoprolol. Last visit toto EC was in July 2018and itwas secondary to panic attack causing chest pain.She states she has a fit bit and while at rest and had palpitation, she looked down on her fitbit and HR was 150, it lasted about 10 minutes and resolved by itself.She needs to follow up with Dr. Kirke Corin, having difficulty scheduling appointment and asked [place] to contact his office for her   GAD: Maxine had to take some time off work back in September 2016 because of severe anxiety associated with her work. .She states she is taking Lexapro and Alprazolam XR. control. She does nto seem to be compliant with medication  because bottle is empty.  Explained importance of compliance  Pre-diabetes: no polyphagia, polydipsia or polyuria, last hgbA1C was 5.8%. Discussed again life style modification She has been walking around the track twice three times a week   Patient Active Problem List   Diagnosis Date Noted  . History of shingles 04/17/2017  . Post-menopause on HRT (hormone replacement therapy) 11/05/2015  . Fasting hyperglycemia 08/10/2015  . Anxiety, generalized 03/20/2015  . Bradycardia 03/20/2015  . Chronic tension-type headache, intractable 03/20/2015  . Gastro-esophageal reflux disease without esophagitis 03/20/2015  . IBS (irritable bowel syndrome) 03/20/2015  . Menopause 03/20/2015  . Vitamin D deficiency 03/20/2015  . Vasovagal syncope 08/18/2014  . PSVT (paroxysmal supraventricular tachycardia) (HCC) 04/12/2012  . External hemorrhoids 07/15/2007    Past Surgical History:  Procedure Laterality Date  . ABDOMINAL HYSTERECTOMY     2013  . BREAST BIOPSY Left 2013   core w/clip - neg  . LAPAROSCOPIC TOTAL HYSTERECTOMY      Family History  Problem Relation Age of Onset  . Cancer Mother   . Diabetes Mother   . Cancer Father   . Diabetes Father   . Breast cancer Neg Hx     Social History   Socioeconomic History  . Marital status: Divorced    Spouse name: Not on file  . Number of children: 2  . Years of education: Not on file  . Highest  education level: High school graduate  Occupational History  . 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 Financial resource strain: Somewhat hard  . Food insecurity:    Worry: Never true    Inability: Never true  . Transportation needs:    Medical: No    Non-medical: No  Tobacco Use  . Smoking status: Never Smoker  . Smokeless tobacco: Never Used  Substance and Sexual Activity  . Alcohol use: No    Alcohol/week: 0.0 standard drinks  . Drug use: No  . Sexual activity: Yes    Partners: Male  Lifestyle  .  Physical activity:    Days per week: 0 days    Minutes per session: 0 min  . Stress: Only a little  Relationships  . Social connections:    Talks on phone: Once a week    Gets together: Once a week    Attends religious service: More than 4 times per year    Active member of club or organization: Yes    Attends meetings of clubs or organizations: More than 4 times per year    Relationship status: Divorced  . Intimate partner violence:    Fear of current or ex partner: No    Emotionally abused: No    Physically abused: No    Forced sexual activity: No  Other Topics Concern  . Not on file  Social History Narrative   Divorced   Dating Fayrene Fearing since 2015     Current Outpatient Medications:  .  acetaminophen (TYLENOL) 500 MG chewable tablet, Chew 500 mg by mouth every 6 (six) hours as needed., Disp: , Rfl:  .  ALPRAZolam (XANAX XR) 0.5 MG 24 hr tablet, Take 1 tablet (0.5 mg total) by mouth daily., Disp: 30 tablet, Rfl: 2 .  aspirin 81 MG tablet, Take 81 mg by mouth as needed. , Disp: , Rfl:  .  butalbital-acetaminophen-caffeine (FIORICET WITH CODEINE) 50-325-40-30 MG capsule, TAKE ONE CAPSULE BY MOUTH EVERY 4 HOURS AS NEEDED FOR HEADACHE, Disp: 30 capsule, Rfl: 0 .  Cholecalciferol (VITAMIN D) 2000 UNITS tablet, Take 2,000 Units by mouth daily., Disp: , Rfl:  .  escitalopram (LEXAPRO) 10 MG tablet, Take 1 tablet (10 mg total) by mouth daily., Disp: 30 tablet, Rfl: 2 .  linaclotide (LINZESS) 72 MCG capsule, Take 1 capsule (72 mcg total) by mouth daily. (Patient taking differently: Take 72 mcg by mouth daily as needed. ), Disp: 30 capsule, Rfl: 2 .  meloxicam (MOBIC) 15 MG tablet, Take 1 tablet by mouth daily., Disp: , Rfl:  .  metoprolol tartrate (LOPRESSOR) 25 MG tablet, TAKE 1 TABLET (25 MG TOTAL) BY MOUTH 2 (TWO) TIMES DAILY., Disp: 60 tablet, Rfl: 3 .  naproxen sodium (ANAPROX) 550 MG tablet, naproxen sodium 550 mg tablet  Take 1 tablet every 12 hours by oral route as needed., Disp: ,  Rfl:  .  traMADol (ULTRAM) 50 MG tablet, tramadol 50 mg tablet  Take 1 tablet every 6 hours by oral route., Disp: , Rfl:   No Known Allergies  I personally reviewed active problem list, medication list, allergies, family history, social history with the patient/caregiver today.   ROS  Constitutional: Negative for fever or weight change.  Respiratory: Negative for cough and shortness of breath.   Cardiovascular: Negative for chest pain or palpitations.  Gastrointestinal: Negative for abdominal pain, no bowel changes.  Musculoskeletal: positive for gait problem - intermittent limping from left knee injury and intermittent  joint  swelling.  Skin: Negative for rash.  Neurological: Negative for dizziness , positive for intermittent  headache.  No other specific complaints in a complete review of systems (except as listed in HPI above).  Objective  Vitals:   06/12/18 1510  BP: 108/64  Pulse: 81  Resp: 16  Temp: 97.9 F (36.6 C)  TempSrc: Oral  SpO2: 99%  Weight: 139 lb 9.6 oz (63.3 kg)  Height: 5\' 4"  (1.626 m)    Body mass index is 23.96 kg/m.  Physical Exam  Constitutional: Patient appears well-developed and well-nourished. No distress.  HEENT: head atraumatic, normocephalic, pupils equal and reactive to light,  neck supple, throat within normal limits Cardiovascular: Normal rate, regular rhythm and normal heart sounds.  No murmur heard. No BLE edema. Pulmonary/Chest: Effort normal and breath sounds normal. No respiratory distress. Abdominal: Soft.  There is no tenderness. Psychiatric: Patient has a normal mood and affect. behavior is normal. Judgment and thought content normal.  PHQ2/9: Depression screen [facility] 2/9 06/12/2018 03/11/2018 12/10/2017 09/04/2017 04/17/2017  Decreased Interest 2 0 2 2 0  Down, Depressed, Hopeless 2 0 2 2 0  PHQ - 2 Score 4 0 4 4 0  Altered sleeping 0 0 0 2 -  Tired, decreased energy 2 2 3 3  -  Change in appetite 0 0 0 2 -  Feeling bad or failure  about yourself  0 0 0 0 -  Trouble concentrating 0 0 0 0 -  Moving slowly or fidgety/restless 0 0 0 2 -  Suicidal thoughts 0 0 - 0 -  PHQ-9 Score 6 2 7 13  -  Difficult doing work/chores Not difficult at all Not difficult at all - Somewhat difficult -     Fall Risk: Fall Risk  06/12/2018 03/11/2018 12/10/2017 09/04/2017 06/04/2017  Falls in the past year? Yes Yes No No No  Number falls in past yr: 1 1 - - -  Comment Jan 03, 2018 - - - -  Injury with Fall? Yes Yes - - -  Comment Left Knee cap Left Knee - - -      Functional Status Survey: Is the patient deaf or have difficulty hearing?: No Does the patient have difficulty seeing, even when wearing glasses/contacts?: Yes(glasses) Does the patient have difficulty concentrating, remembering, or making decisions?: No Does the patient have difficulty walking or climbing stairs?: Yes(Left Knee Fracture) Does the patient have difficulty dressing or bathing?: No Does the patient have difficulty doing errands alone such as visiting a doctor's office or shopping?: No    Assessment & Plan   1. Mild major depression (HCC)  - escitalopram (LEXAPRO) 10 MG tablet; Take 1 tablet (10 mg total) by mouth 2 (two) times daily.  Dispense: 60 tablet; Refill: 2 2. Breast cancer screening  - MM DIGITAL SCREENING BILATERAL; Future   3. Chronic tension-type headache, intractable  Doing well on prn medication   4. Vitamin D deficiency  Continue otc medication   5. Pre-diabetes  Reminded her of diabetic diet, she has more active lately  6. Gastro-esophageal reflux disease without esophagitis   7. PSVT (paroxysmal supraventricular tachycardia) (HCC)  Needs to follow up with Dr. Tobi Bastos  8. Anxiety, generalized  - escitalopram (LEXAPRO) 10 MG tablet; Take 1 tablet (10 mg total) by mouth 2 (two) times daily.  Dispense: 60 tablet; Refill: 2 - ALPRAZolam (XANAX XR) 0.5 MG 24 hr tablet; Take 1 tablet (0.5 mg total) by mouth daily.  Dispense: 30  tablet; Refill: 2  9.  Panic attack  - escitalopram (LEXAPRO) 10 MG tablet; Take 1 tablet (10 mg total) by mouth 2 (two) times daily.  Dispense: 60 tablet; Refill: 2

## 2018-06-14 ENCOUNTER — Telehealth: Payer: Self-pay

## 2018-06-14 ENCOUNTER — Other Ambulatory Visit: Payer: Self-pay

## 2018-06-14 NOTE — Telephone Encounter (Signed)
Patient contacted office requesting to reschedule her colonoscopy from 11/19 because she preferred an afternoon procedure.  Explained to patient that I could move her to Dr. Verdis Prime scheduled for the same day and the time would be given to her the day before the procedure.  She asked to be moved to another date.  She has been rescheduled to 06/27/18 with Dr. Allegra Lai for an afternoon.  Rosann Auerbach has been informed of change and referral has been updated.   Thanks  [employer]

## 2018-06-26 ENCOUNTER — Encounter: Payer: Self-pay | Admitting: *Deleted

## 2018-06-27 ENCOUNTER — Ambulatory Visit: Payer: 59 | Admitting: Anesthesiology

## 2018-06-27 ENCOUNTER — Encounter: Payer: Self-pay | Admitting: Anesthesiology

## 2018-06-27 ENCOUNTER — Encounter
Admission: RE | Disposition: A | Discharge status: Home / Self Care | Payer: Self-pay | Source: Ambulatory Visit | Attending: Gastroenterology

## 2018-06-27 ENCOUNTER — Ambulatory Visit
Admission: RE | Admit: 2018-06-27 | Discharge: 2018-06-27 | Disposition: A | Discharge status: Home / Self Care | Payer: 59 | Source: Ambulatory Visit | Attending: Gastroenterology | Admitting: Gastroenterology

## 2018-06-27 DIAGNOSIS — K589 Irritable bowel syndrome without diarrhea: Secondary | ICD-10-CM | POA: Diagnosis not present

## 2018-06-27 DIAGNOSIS — E559 Vitamin D deficiency, unspecified: Secondary | ICD-10-CM | POA: Diagnosis not present

## 2018-06-27 DIAGNOSIS — F419 Anxiety disorder, unspecified: Secondary | ICD-10-CM | POA: Insufficient documentation

## 2018-06-27 DIAGNOSIS — J45909 Unspecified asthma, uncomplicated: Secondary | ICD-10-CM | POA: Diagnosis not present

## 2018-06-27 DIAGNOSIS — Z7982 Long term (current) use of aspirin: Secondary | ICD-10-CM | POA: Diagnosis not present

## 2018-06-27 DIAGNOSIS — F411 Generalized anxiety disorder: Secondary | ICD-10-CM | POA: Diagnosis not present

## 2018-06-27 DIAGNOSIS — I471 Supraventricular tachycardia: Secondary | ICD-10-CM | POA: Insufficient documentation

## 2018-06-27 DIAGNOSIS — Z791 Long term (current) use of non-steroidal anti-inflammatories (NSAID): Secondary | ICD-10-CM | POA: Diagnosis not present

## 2018-06-27 DIAGNOSIS — Z79899 Other long term (current) drug therapy: Secondary | ICD-10-CM | POA: Diagnosis not present

## 2018-06-27 DIAGNOSIS — K219 Gastro-esophageal reflux disease without esophagitis: Secondary | ICD-10-CM | POA: Insufficient documentation

## 2018-06-27 DIAGNOSIS — Z1211 Encounter for screening for malignant neoplasm of colon: Secondary | ICD-10-CM

## 2018-06-27 HISTORY — PX: COLONOSCOPY WITH PROPOFOL: SHX5780

## 2018-06-27 SURGERY — COLONOSCOPY WITH PROPOFOL
Anesthesia: General

## 2018-06-27 MED ORDER — PROPOFOL 500 MG/50ML IV EMUL
INTRAVENOUS | Status: AC
  Filled 2018-06-27: qty 50

## 2018-06-27 MED ORDER — ESMOLOL HCL 100 MG/10ML IV SOLN
INTRAVENOUS | Status: DC | PRN
  Administered 2018-06-27: 10 mg via INTRAVENOUS

## 2018-06-27 MED ORDER — PROPOFOL 500 MG/50ML IV EMUL
INTRAVENOUS | Status: DC | PRN
  Administered 2018-06-27: 150 ug/kg/min via INTRAVENOUS

## 2018-06-27 MED ORDER — SODIUM CHLORIDE 0.9 % IV SOLN
INTRAVENOUS | Status: DC
  Administered 2018-06-27: 1000 mL via INTRAVENOUS

## 2018-06-27 MED ORDER — LIDOCAINE 2% (20 MG/ML) 5 ML SYRINGE
INTRAMUSCULAR | Status: DC | PRN
  Administered 2018-06-27: 60 mg via INTRAVENOUS

## 2018-06-27 MED ORDER — PROPOFOL 10 MG/ML IV BOLUS
INTRAVENOUS | Status: DC | PRN
  Administered 2018-06-27: 80 mg via INTRAVENOUS
  Administered 2018-06-27: 20 mg via INTRAVENOUS

## 2018-06-27 NOTE — Op Note (Signed)
[facility]enter[facility] Patient Name: Chelsea Decker Procedure Date: 06/27/2018 10:33 AM MRN: [id number] Account #: [id number] Date of Birth: Dec 13, 1960 Admit Type: Outpatient Age: 57 Room: [facility] ENDO ROOM 4 Gender: Female Note Status: Finalized Procedure:            Colonoscopy Indications:          Screening for colorectal malignant neoplasm, Last                        colonoscopy: November 2008 Providers:            Lin Landsman MD, MD Referring MD:         Bethena Roys. Sowles, MD (Referring MD) Medicines:            Monitored Anesthesia Care Complications:        No immediate complications. Estimated blood loss: None. Procedure:            Pre-Anesthesia Assessment:                       - Prior to the procedure, a History and Physical was                        performed, and patient medications and allergies were                        reviewed. The patient is competent. The risks and                        benefits of the procedure and the sedation options and                        risks were discussed with the patient. All questions                        were answered and informed consent was obtained.                        Patient identification and proposed procedure were                        verified by the physician, the nurse, the                        anesthesiologist, the anesthetist and the technician in                        the pre-procedure area in the procedure room in the                        endoscopy suite. Mental Status Examination: alert and                        oriented. Airway Examination: normal oropharyngeal                        airway and neck mobility. Respiratory Examination:                        clear to auscultation. CV Examination: normal.  Prophylactic Antibiotics: The patient does not require                        prophylactic antibiotics. Prior Anticoagulants: The             patient has taken no previous anticoagulant or                        antiplatelet agents. ASA Grade Assessment: III - A                        patient with severe systemic disease. After reviewing                        the risks and benefits, the patient was deemed in                        satisfactory condition to undergo the procedure. The                        anesthesia plan was to use monitored anesthesia care                        (MAC). Immediately prior to administration of                        medications, the patient was re-assessed for adequacy                        to receive sedatives. The heart rate, respiratory rate,                        oxygen saturations, blood pressure, adequacy of                        pulmonary ventilation, and response to care were                        monitored throughout the procedure. The physical status                        of the patient was re-assessed after the procedure.                       After obtaining informed consent, the colonoscope was                        passed under direct vision. Throughout the procedure,                        the patient's blood pressure, pulse, and oxygen                        saturations were monitored continuously. The                        Colonoscope was introduced through the anus and                        advanced to the the terminal ileum, with identification  of the appendiceal orifice and IC valve. The                        colonoscopy was unusually difficult due to poor bowel                        prep with stool present and significant looping.                        Successful completion of the procedure was aided by                        changing the patient to a prone position, applying                        abdominal pressure and lavage. The patient tolerated                        the procedure well. The quality of the bowel                         preparation was evaluated using the BBPS [facility] Bowel                        Preparation Scale) with scores of: Right Colon = 2                        (minor amount of residual staining, small fragments of                        stool and/or opaque liquid, but mucosa seen well),                        Transverse Colon = 2 (minor amount of residual                        staining, small fragments of stool and/or opaque                        liquid, but mucosa seen well) and Left Colon = 2 (minor                        amount of residual staining, small fragments of stool                        and/or opaque liquid, but mucosa seen well). The total                        BBPS score equals 6. Findings:      The perianal and digital rectal examinations were normal. Pertinent       negatives include normal sphincter tone and no palpable rectal lesions.      The terminal ileum appeared normal.      A large amount of liquid stool was found in the entire colon, precluding       visualization. Lavage of the area was performed using 200 - 500 mL of       sterile water, resulting in clearance with fair visualization.  The retroflexed view of the distal rectum and anal verge was normal and       showed no anal or rectal abnormalities.      Normal mucosa was found in the entire colon. Impression:           - The examined portion of the ileum was normal.                       - Stool in the entire examined colon.                       - The distal rectum and anal verge are normal on                        retroflexion view.                       - Normal mucosa in the entire examined colon.                       - No specimens collected. Recommendation:       - Discharge patient to home (with escort).                       - Resume previous diet today.                       - Continue present medications.                       - Repeat colonoscopy in 5 years for surveillance due to                         fair prep. Procedure Code(s):    --- Professional ---                       Q0086, Colorectal cancer screening; colonoscopy on                        individual not meeting criteria for high risk Diagnosis Code(s):    --- Professional ---                       Z12.11, Encounter for screening for malignant neoplasm                        of colon CPT copyright 2018 American Medical Association. All rights reserved. The codes documented in this report are preliminary and upon coder review may  be revised to meet current compliance requirements. Dr. Ulyess Mort Lin Landsman MD, MD 06/27/2018 11:58:27 AM This report has been signed electronically. Number of Addenda: 0 Note Initiated On: 06/27/2018 10:33 AM Scope Withdrawal Time: 0 hours 14 minutes 2 seconds  Total Procedure Duration: 0 hours 21 minutes 24 seconds       [facility]

## 2018-06-27 NOTE — H&P (Addendum)
Chelsea Repressohini R Chabeli Barsamian, Decker [address]  [address]  [place], [place] [address]  Main: [phone]  Fax: [phone] Pager: [phone]  Primary Care Physician:  Chelsea Decker, Krichna, Decker Primary Gastroenterologist:  Chelsea Decker  Pre-Procedure History & Physical: HPI:  Chelsea Decker is Decker 57 y.o. female is here for an colonoscopy.   Past Medical History:  Diagnosis Date  . Anxiety   . Asthma   . External hemorrhoids without mention of complication   . GERD (gastroesophageal reflux disease)   . Heart palpitations   . IBS (irritable bowel syndrome)   . Lump or mass in breast   . Palpitations   . Patellar sleeve fracture of left knee 01/03/2018  . PSVT (paroxysmal supraventricular tachycardia) (HCC) 04/12/2012  . Unspecified vitamin D deficiency     Past Surgical History:  Procedure Laterality Date  . ABDOMINAL HYSTERECTOMY     2013  . BREAST BIOPSY Left 2013   core w/clip - neg  . LAPAROSCOPIC TOTAL HYSTERECTOMY      Prior to Admission medications   Medication Sig Start Date End Date Taking? Authorizing Provider  acetaminophen (TYLENOL) 500 MG chewable tablet Chew 500 mg by mouth every 6 (six) hours as needed.   Yes Provider, Historical, Decker  ALPRAZolam (XANAX XR) 0.5 MG 24 hr tablet Take 1 tablet (0.5 mg total) by mouth daily. 06/12/18  Yes Chelsea Decker, Krichna, Decker  aspirin 81 MG tablet Take 81 mg by mouth as needed.    Yes Provider, Historical, Decker  Cholecalciferol (VITAMIN D) 2000 UNITS tablet Take 2,000 Units by mouth daily.   Yes Provider, Historical, Decker  escitalopram (LEXAPRO) 10 MG tablet Take 1 tablet (10 mg total) by mouth 2 (two) times daily. 06/12/18  Yes Sowles, Danna HeftyKrichna, Decker  linaclotide Karlene Einstein(LINZESS) 72 MCG capsule Take 1 capsule (72 mcg total) by mouth daily. Patient taking differently: Take 72 mcg by mouth daily as needed.  11/24/16  Yes Sowles, Danna HeftyKrichna, Decker  meloxicam (MOBIC) 15 MG tablet Take 1 tablet by mouth daily.   Yes Chelsea Decker  metoprolol tartrate  (LOPRESSOR) 25 MG tablet TAKE 1 TABLET (25 MG TOTAL) BY MOUTH 2 (TWO) TIMES DAILY. 04/17/17  Yes Chelsea Decker  naproxen sodium (ANAPROX) 550 MG tablet naproxen sodium 550 mg tablet  Take 1 tablet every 12 hours by oral route as needed.   Yes Provider, Historical, Decker  traMADol (ULTRAM) 50 MG tablet tramadol 50 mg tablet  Take 1 tablet every 6 hours by oral route.   Yes Chelsea Decker  butalbital-acetaminophen-caffeine (FIORICET WITH CODEINE) [phone] MG capsule TAKE ONE CAPSULE BY MOUTH EVERY 4 HOURS AS NEEDED FOR HEADACHE 01/18/18   Chelsea Decker, Krichna, Decker    Allergies as of 06/13/2018  . (No Known Allergies)    Family History  Problem Relation Age of Onset  . Cancer Mother   . Diabetes Mother   . Cancer Father   . Diabetes Father   . Breast cancer Neg Hx     Social History   Socioeconomic History  . Marital status: Divorced    Spouse name: Not on file  . Number of children: 2  . Years of education: Not on file  . Highest education level: High school graduate  Occupational History  . 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 Financial resource strain: Somewhat hard  . Food insecurity:    Worry: Never true    Inability: Never true  .  Transportation needs:    Medical: No    Non-medical: No  Tobacco Use  . Smoking status: Never Smoker  . Smokeless tobacco: Never Used  Substance and Sexual Activity  . Alcohol use: No    Alcohol/week: 0.0 standard drinks  . Drug use: No  . Sexual activity: Yes    Partners: Male  Lifestyle  . Physical activity:    Days per week: 0 days    Minutes per session: 0 min  . Stress: Only Decker little  Relationships  . Social connections:    Talks on phone: Once Decker week    Gets together: Once Decker week    Attends religious service: More than 4 times per year    Active member of club or organization: Yes    Attends meetings of clubs or organizations: More than 4 times per year    Relationship status: Divorced  .  Intimate partner violence:    Fear of current or ex partner: No    Emotionally abused: No    Physically abused: No    Forced sexual activity: No  Other Topics Concern  . Not on file  Social History Narrative   Divorced   Dating Fayrene Fearing since 2015    Review of Systems: See HPI, otherwise negative ROS  Physical Exam: BP 105/73   Pulse 63   Temp (!) 96.5 F (35.8 C) (Tympanic)   Resp 16   Ht 5\' 4"  (1.626 m)   Wt 61.2 kg   SpO2 100%   BMI 23.17 kg/m  General:   Alert,  pleasant and cooperative in NAD Head:  Normocephalic and atraumatic. Neck:  Supple; no masses or thyromegaly. Lungs:  Clear throughout to auscultation.    Heart:  Regular rate and rhythm. Abdomen:  Soft, nontender and nondistended. Normal bowel sounds, without guarding, and without rebound.   Neurologic:  Alert and  oriented x4;  grossly normal neurologically.  Impression/Plan: Chelsea Decker is here for an colonoscopy to be performed for colon cancer screening  Risks, benefits, limitations, and alternatives regarding  colonoscopy have been reviewed with the patient.  Questions have been answered.  All parties agreeable.   Chelsea Donath, Decker  06/27/2018, 10:28 AM

## 2018-06-27 NOTE — Transfer of Care (Signed)
Immediate Anesthesia Transfer of Care Note  Patient: Chelsea Decker  Procedure(s) Performed: COLONOSCOPY WITH PROPOFOL (N/A )  Patient Location: PACU  Anesthesia Type:General  Level of Consciousness: awake and sedated  Airway & Oxygen Therapy: Patient Spontanous Breathing and Patient connected to nasal cannula oxygen  Post-op Assessment: Report given to RN and Post -op Vital signs reviewed and stable  Post vital signs: Reviewed and stable  Last Vitals:  Vitals Value Taken Time  BP 116/89 06/27/2018 12:00 PM  Temp 36.3 C 06/27/2018 12:00 PM  Pulse 109 06/27/2018 12:02 PM  Resp 18 06/27/2018 12:02 PM  SpO2 99 % 06/27/2018 12:02 PM  Vitals shown include unvalidated device data.  Last Pain:  Vitals:   06/27/18 1200  TempSrc:   PainSc: 0-No pain         Complications: No apparent anesthesia complications

## 2018-06-27 NOTE — Anesthesia Preprocedure Evaluation (Signed)
Anesthesia Evaluation  Patient identified by MRN, date of birth, ID band Patient awake    Reviewed: Allergy & Precautions, NPO status , Patient's Chart, lab work & pertinent test results, reviewed documented beta blocker date and time   Airway Mallampati: II  TM Distance: >3 FB     Dental  (+) Chipped   Pulmonary asthma ,           Cardiovascular + dysrhythmias Supra Ventricular Tachycardia      Neuro/Psych  Headaches, PSYCHIATRIC DISORDERS Anxiety    GI/Hepatic GERD  ,  Endo/Other    Renal/GU      Musculoskeletal   Abdominal   Peds  Hematology   Anesthesia Other Findings Takes B-Blockers. Has had vaso-vagal episodes. EKG ok.  Reproductive/Obstetrics                             Anesthesia Physical Anesthesia Plan  ASA: III  Anesthesia Plan: General   Post-op Pain Management:    Induction: Intravenous  PONV Risk Score and Plan:   Airway Management Planned:   Additional Equipment:   Intra-op Plan:   Post-operative Plan:   Informed Consent: I have reviewed the patients History and Physical, chart, labs and discussed the procedure including the risks, benefits and alternatives for the proposed anesthesia with the patient or authorized representative who has indicated his/her understanding and acceptance.     Plan Discussed with: CRNA  Anesthesia Plan Comments:         Anesthesia Quick Evaluation

## 2018-06-27 NOTE — Anesthesia Post-op Follow-up Note (Signed)
Anesthesia QCDR form completed.        

## 2018-06-27 NOTE — Anesthesia Postprocedure Evaluation (Signed)
Anesthesia Post Note  Patient: Chelsea Decker  Procedure(s) Performed: COLONOSCOPY WITH PROPOFOL (N/A )  Patient location during evaluation: Endoscopy Anesthesia Type: General Level of consciousness: awake and alert Pain management: pain level controlled Vital Signs Assessment: post-procedure vital signs reviewed and stable Respiratory status: spontaneous breathing, nonlabored ventilation, respiratory function stable and patient connected to nasal cannula oxygen Cardiovascular status: blood pressure returned to baseline and stable Postop Assessment: no apparent nausea or vomiting Anesthetic complications: no     Last Vitals:  Vitals:   06/27/18 1220 06/27/18 1230  BP: 125/84 125/66  Pulse: (!) 113 (!) 110  Resp: 20 20  Temp:    SpO2:      Last Pain:  Vitals:   06/27/18 1230  TempSrc:   PainSc: 0-No pain                 Jarmar Rousseau S

## 2018-06-28 ENCOUNTER — Encounter: Payer: Self-pay | Admitting: Gastroenterology

## 2018-07-01 ENCOUNTER — Encounter: Payer: Self-pay | Admitting: Family Medicine

## 2018-07-01 ENCOUNTER — Ambulatory Visit: Payer: 59 | Admitting: Family Medicine

## 2018-07-01 VITALS — BP 142/92 | HR 67 | Temp 98.2°F | Resp 16 | Ht 64.0 in | Wt 136.8 lb

## 2018-07-01 DIAGNOSIS — L309 Dermatitis, unspecified: Secondary | ICD-10-CM

## 2018-07-01 DIAGNOSIS — R030 Elevated blood-pressure reading, without diagnosis of hypertension: Secondary | ICD-10-CM | POA: Diagnosis not present

## 2018-07-01 MED ORDER — TRIAMCINOLONE ACETONIDE 0.1 % EX CREA: 1.0000 "application " | TOPICAL_CREAM | Freq: Two times a day (BID) | CUTANEOUS | 0 refills | Status: DC

## 2018-07-01 NOTE — Patient Instructions (Signed)
Lidoderm lotion, Nivea lotion or eucerin lotion
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
## 2018-07-08 ENCOUNTER — Ambulatory Visit: Payer: 59

## 2018-07-08 VITALS — BP 126/64 | HR 56

## 2018-07-08 DIAGNOSIS — I471 Supraventricular tachycardia, unspecified: Secondary | ICD-10-CM

## 2018-07-09 ENCOUNTER — Ambulatory Visit
Admission: RE | Admit: 2018-07-09 | Discharge: 2018-07-09 | Disposition: A | Discharge status: Home / Self Care | Payer: 59 | Source: Ambulatory Visit | Attending: Family Medicine | Admitting: Family Medicine

## 2018-07-09 DIAGNOSIS — Z1231 Encounter for screening mammogram for malignant neoplasm of breast: Secondary | ICD-10-CM | POA: Insufficient documentation

## 2018-07-09 DIAGNOSIS — Z1239 Encounter for other screening for malignant neoplasm of breast: Secondary | ICD-10-CM | POA: Insufficient documentation
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
## 2018-09-11 ENCOUNTER — Ambulatory Visit: Payer: 59 | Admitting: Family Medicine

## 2018-09-13 ENCOUNTER — Encounter: Payer: Self-pay | Admitting: Family Medicine

## 2018-09-13 ENCOUNTER — Ambulatory Visit: Payer: Self-pay | Admitting: Family Medicine

## 2018-09-13 VITALS — BP 120/80 | HR 86 | Temp 97.6°F | Resp 16 | Ht 64.0 in | Wt 137.0 lb

## 2018-09-13 DIAGNOSIS — F411 Generalized anxiety disorder: Secondary | ICD-10-CM

## 2018-09-13 DIAGNOSIS — K581 Irritable bowel syndrome with constipation: Secondary | ICD-10-CM

## 2018-09-13 DIAGNOSIS — I471 Supraventricular tachycardia: Secondary | ICD-10-CM

## 2018-09-13 DIAGNOSIS — G44221 Chronic tension-type headache, intractable: Secondary | ICD-10-CM

## 2018-09-13 DIAGNOSIS — K219 Gastro-esophageal reflux disease without esophagitis: Secondary | ICD-10-CM

## 2018-09-13 DIAGNOSIS — F320 Major depressive disorder, single episode, mild: Secondary | ICD-10-CM | POA: Insufficient documentation

## 2018-09-13 DIAGNOSIS — E559 Vitamin D deficiency, unspecified: Secondary | ICD-10-CM

## 2018-09-13 DIAGNOSIS — Z8781 Personal history of (healed) traumatic fracture: Secondary | ICD-10-CM

## 2018-09-13 DIAGNOSIS — R7303 Prediabetes: Secondary | ICD-10-CM

## 2018-09-13 MED ORDER — CITALOPRAM HYDROBROMIDE 20 MG PO TABS: 20.0000 mg | ORAL_TABLET | Freq: Every day | ORAL | 2 refills | Status: DC

## 2018-09-13 MED ORDER — ALPRAZOLAM ER 0.5 MG PO TB24: 0.5000 mg | ORAL_TABLET | Freq: Every day | ORAL | 2 refills | Status: DC

## 2018-09-13 NOTE — Progress Notes (Signed)
Name: Chelsea Decker   MRN: [id number]    DOB: Apr 27, 1961   Date:09/13/2018       Progress Note  Subjective  Chief Complaint  Chief Complaint  Patient presents with  . Gastroesophageal Reflux  . Irritable Bowel Syndrome  . Anxiety  . Depression    HPI   Tension Headaches: she has episodes of headaches pain is described as a throbbing or sharp sensation, and radiates to right side of neck, sometimes on left side, at times on temporal area. No photophobia or phonophobia, or focal deficits. Lasts about one hour. Episodes of headaches a few times a week, but not as intense after she lost her job   IBS with constipation: symptoms have improved with Linzess, currently not taking medication daily because bowels have been regular, she takes it prn only now. Unchanged. No pain recently   GERD:she is off medication, she states occasionally has epigastric pain but no heartburn or weight loss   SVT: seen by Dr. Kirke Corin, stress test in 2013 showed HR went up to 220 with maximum activity,she is avoiding chocolate and caffeine.She is on Metoprolol. Last visit toto EC was in July 2018and itwas secondary to panic attack causing chest pain. No recent episodes of tachycardia lately based on her fibit She needs to follow up with Dr. Kirke Corin, but she has a balance with him and needs to wait until she pays it off   GAD: Ivalou had to take some time off work back in September 2016 because of severe anxiety associated with her work. .She states she is taking Lexapro and Alprazolam XR. control. She lost her job and we will switch to Citalopram to decrease cost of medication   Pre-diabetes: no polyphagia, polydipsia or polyuria, last hgbA1Cwas 5.8%. She is still walking on the track.    Patient Active Problem List   Diagnosis Date Noted  . Mild major depression (HCC) 09/13/2018  . Encounter for screening colonoscopy   . History of shingles 04/17/2017  . Post-menopause on HRT (hormone  replacement therapy) 11/05/2015  . Fasting hyperglycemia 08/10/2015  . Anxiety, generalized 03/20/2015  . Bradycardia 03/20/2015  . Chronic tension-type headache, intractable 03/20/2015  . Gastro-esophageal reflux disease without esophagitis 03/20/2015  . IBS (irritable bowel syndrome) 03/20/2015  . Menopause 03/20/2015  . Vitamin D deficiency 03/20/2015  . Vasovagal syncope 08/18/2014  . PSVT (paroxysmal supraventricular tachycardia) (HCC) 04/12/2012  . External hemorrhoids 07/15/2007    Past Surgical History:  Procedure Laterality Date  . ABDOMINAL HYSTERECTOMY     2013  . BREAST BIOPSY Left 2013   core w/clip - neg  . COLONOSCOPY WITH PROPOFOL N/A 06/27/2018   Procedure: COLONOSCOPY WITH PROPOFOL;  Surgeon: Toney Reil, MD;  Location: [facility] ENDOSCOPY;  Service: Gastroenterology;  Laterality: N/A;  . LAPAROSCOPIC TOTAL HYSTERECTOMY      Family History  Problem Relation Age of Onset  . Cancer Mother   . Diabetes Mother   . Cancer Father   . Diabetes Father   . Breast cancer Neg Hx     Social History   Socioeconomic History  . Marital status: Divorced    Spouse name: Not on file  . Number of children: 2  . Years of education: Not on file  . Highest education level: High school graduate  Occupational History  . Not on file  Social Needs  . Financial resource strain: Somewhat hard  . Food insecurity:    Worry: Never true    Inability: Never true  .  Transportation needs:    Medical: No    Non-medical: No  Tobacco Use  . Smoking status: Never Smoker  . Smokeless tobacco: Never Used  Substance and Sexual Activity  . Alcohol use: No    Alcohol/week: 0.0 standard drinks  . Drug use: No  . Sexual activity: Yes    Partners: Male  Lifestyle  . Physical activity:    Days per week: 0 days    Minutes per session: 0 min  . Stress: Only a little  Relationships  . Social connections:    Talks on phone: Once a week    Gets together: Once a week    Attends  religious service: More than 4 times per year    Active member of club or organization: Yes    Attends meetings of clubs or organizations: More than 4 times per year    Relationship status: Divorced  . Intimate partner violence:    Fear of current or ex partner: No    Emotionally abused: No    Physically abused: No    Forced sexual activity: No  Other Topics Concern  . Not on file  Social History Narrative   Divorced   Dating Fayrene FearingJames since 2015   Lost her job at [employer] and is looking for another job    Her youngest child - son, has disability since birth, MR      Current Outpatient Medications:  .  acetaminophen (TYLENOL) 500 MG chewable tablet, Chew 500 mg by mouth every 6 (six) hours as needed., Disp: , Rfl:  .  ALPRAZolam (XANAX XR) 0.5 MG 24 hr tablet, Take 1 tablet (0.5 mg total) by mouth daily., Disp: 30 tablet, Rfl: 2 .  aspirin 81 MG tablet, Take 81 mg by mouth as needed. , Disp: , Rfl:  .  butalbital-acetaminophen-caffeine (FIORICET WITH CODEINE) 50-325-40-30 MG capsule, TAKE ONE CAPSULE BY MOUTH EVERY 4 HOURS AS NEEDED FOR HEADACHE, Disp: 30 capsule, Rfl: 0 .  Cholecalciferol (VITAMIN D) 2000 UNITS tablet, Take 2,000 Units by mouth daily., Disp: , Rfl:  .  linaclotide (LINZESS) 72 MCG capsule, Take 1 capsule (72 mcg total) by mouth daily. (Patient taking differently: Take 72 mcg by mouth daily as needed. ), Disp: 30 capsule, Rfl: 2 .  metoprolol tartrate (LOPRESSOR) 25 MG tablet, TAKE 1 TABLET (25 MG TOTAL) BY MOUTH 2 (TWO) TIMES DAILY., Disp: 60 tablet, Rfl: 3 .  naproxen sodium (ANAPROX) 550 MG tablet, naproxen sodium 550 mg tablet  Take 1 tablet every 12 hours by oral route as needed., Disp: , Rfl:  .  triamcinolone cream (KENALOG) 0.1 %, Apply 1 application topically 2 (two) times daily. Mix with Eucerin at home and apply on both inner legs, Disp: 80 g, Rfl: 0 .  citalopram (CELEXA) 20 MG tablet, Take 1 tablet (20 mg total) by mouth daily., Disp: 30 tablet, Rfl:  2  No Known Allergies  I personally reviewed active problem list, medication list, allergies, family history, social history with the patient/caregiver today.   ROS  Constitutional: Negative for fever or weight change.  Respiratory: Negative for cough and shortness of breath.   Cardiovascular: Negative for chest pain or palpitations.  Gastrointestinal: Negative for abdominal pain, no bowel changes.  Musculoskeletal: Negative for gait problem or joint swelling.  Skin: Negative for rash.  Neurological: Negative for dizziness or headache.  No other specific complaints in a complete review of systems (except as listed in HPI above).   Objective  There were  no vitals filed for this visit.  There is no height or weight on file to calculate BMI.  Physical Exam  Constitutional: Patient appears well-developed and well-nourished.No distress.  HEENT: head atraumatic, normocephalic, pupils equal and reactive to light,  neck supple, throat within normal limits Cardiovascular: Normal rate, regular rhythm and normal heart sounds.  No murmur heard. No BLE edema. Pulmonary/Chest: Effort normal and breath sounds normal. No respiratory distress. Abdominal: Soft.  There is no tenderness. Psychiatric: Patient has a normal mood and affect. behavior is normal. Judgment and thought content normal.  PHQ2/9: [facility] screen [facility] 09/13/2018 07/01/2018 06/12/2018 03/11/2018 12/10/2017  Decreased Interest 0 0 2 0 2  Down, Depressed, Hopeless 0 0 2 0 2  PHQ - 2 Score 0 0 4 0 4  Altered sleeping 0 0 0 0 0  Tired, decreased energy 2 0 2 2 3   Change in appetite 0 0 0 0 0  Feeling bad or failure about yourself  0 0 0 0 0  Trouble concentrating 0 0 0 0 0  Moving slowly or fidgety/restless 0 0 0 0 0  Suicidal thoughts 0 0 0 0 -  PHQ-9 Score 2 0 6 2 7   Difficult doing work/chores - Not difficult at all Not difficult at all Not difficult at all -   GAD 7 : Generalized Anxiety Score 09/13/2018 07/01/2018  06/12/2018 03/11/2018  Nervous, Anxious, on Edge 2 1 0 2  Control/stop worrying 0 1 0 2  Worry too much - different things 2 1 0 2  Trouble relaxing 0 0 2 2  Restless 0 0 0 2  Easily annoyed or irritable 0 0 0 2  Afraid - awful might happen 0 0 0 0  Total GAD 7 Score 4 3 2 12   Anxiety Difficulty - Not difficult at all Not difficult at all Very difficult     Fall Risk: Fall Risk  09/13/2018 07/01/2018 06/12/2018 03/11/2018 12/10/2017  Falls in the past year? 0 1 Yes Yes No  Number falls in past yr: 0 1 1 1  -  Comment - - Jan 03, 2018 - -  Injury with Fall? 0 1 Yes Yes -  Comment - - Left Knee cap Left Knee -     Assessment & Plan  1. Anxiety, generalized  - ALPRAZolam (XANAX XR) 0.5 MG 24 hr tablet; Take 1 tablet (0.5 mg total) by mouth daily.  Dispense: 30 tablet; Refill: 2 - citalopram (CELEXA) 20 MG tablet; Take 1 tablet (20 mg total) by mouth daily.  Dispense: 30 tablet; Refill: 2   2. Mild major depression (HCC)  - citalopram (CELEXA) 20 MG tablet; Take 1 tablet (20 mg total) by mouth daily.  Dispense: 30 tablet; Refill: 2 We will change to citalopram from lexapro since she lost her job, it will be easier for her to afford medication   3. PSVT (paroxysmal supraventricular tachycardia) (HCC)  Controlled with metoprolol   4. Chronic tension-type headache, intractable  Taking fioricet prn only   5. Gastro-esophageal reflux disease without esophagitis  Controlled   6. Pre-diabetes  On life style modification   7. Vitamin D deficiency  Taking otc supplementation   8. History of patellar fracture  States pain is under control   9. Irritable bowel syndrome with constipation  Taking Linzess

## 2018-11-01 ENCOUNTER — Other Ambulatory Visit: Payer: Self-pay

## 2018-11-01 DIAGNOSIS — I471 Supraventricular tachycardia: Secondary | ICD-10-CM

## 2018-11-01 MED ORDER — METOPROLOL TARTRATE 25 MG PO TABS: 25.0000 mg | ORAL_TABLET | Freq: Two times a day (BID) | ORAL | 1 refills | Status: DC

## 2018-11-01 NOTE — Telephone Encounter (Signed)
Refill request for Hypertension medication:  Metolprolol 25 mg  Last office visit pertaining to hypertension: 09/13/2018  BP Readings from Last 3 Encounters:  09/13/18 120/80  07/09/18 126/64  07/01/18 (!) 142/92   Lab Results  Component Value Date   CREATININE 0.92 12/10/2017   BUN 16 12/10/2017   NA 139 12/10/2017   K 4.0 12/10/2017   CL 104 12/10/2017   CO2 31 12/10/2017   Follow-ups on file. None indicated with you. She needs this medication until her follow up with Cardiology on 11/21/18. Many of the specialty offices have closed.

## 2018-11-11 ENCOUNTER — Telehealth: Payer: Self-pay

## 2018-11-11 NOTE — Telephone Encounter (Addendum)
Telephone E-visit Consent obtained verbally.  YOUR CARDIOLOGY TEAM HAS ARRANGED FOR AN E-VISIT FOR YOUR APPOINTMENT - PLEASE REVIEW IMPORTANT INFORMATION BELOW SEVERAL DAYS PRIOR TO YOUR APPOINTMENT  Due to the recent COVID-19 pandemic, we are transitioning in-person office visits to tele-medicine visits in an effort to decrease unnecessary exposure to our patients and staff. Medicare and most insurances are covering these visits without a copay needed. You will need a smartphone if possible. We also encourage you to sign up for MyChart. For patients that do not have this, we can still complete the visit using a regular telephone but do prefer a smartphone to enable video when possible. You may have a close family member that can help. If possible, we also ask that you have a blood pressure cuff and scale at home to measure your blood pressure, heart rate and weight prior to your scheduled appointment. Patients with clinical needs that need an in-person evaluation and testing will still be able to come to the office if absolutely necessary. If you have any questions, feel free to call our office.    IF YOU HAVE A SMARTPHONE, PLEASE DOWNLOAD THE WEBEX APP TO YOUR SMARTPHONE  - If Apple, go to [employer] and type in WebEx in the search bar. Download [employer], the blue/green circle. The app is free but as with any other app download, your phone may require you to verify saved payment information or Apple password. You do NOT have to create a WebEx account.  - If Android, go to [employer] and type in [employer] in the search bar. Download [employer], the blue/green circle. The app is free but as with any other app download, your phone may require you to verify saved payment information or Android password. You do NOT have to create a WebEx account.  It is very helpful to have this downloaded before your visit.    2-3 DAYS BEFORE YOUR APPOINTMENT  You will receive a telephone  call from one of our [facility] team members - your caller ID may say "Unknown caller." If this is a video visit, we will confirm that you have been able to download the WebEx app. We will remind you check your blood pressure, heart rate and weight prior to your scheduled appointment. If you have an Apple Watch or Kardia, please upload any pertinent ECG strips the day before or morning of your appointment to MyChart. Our staff will also make sure you have reviewed the consent and agree to move forward with your scheduled tele-health visit.     THE DAY OF YOUR APPOINTMENT  Approximately 15 minutes prior to your scheduled appointment, you will receive a telephone call from one of Heart[facility] team - your caller ID may say "Unknown caller."  Our staff will confirm medications, vital signs for the day and any symptoms you may be experiencing. Please have this information available prior to the time of visit start. It may also be helpful for you to have a pad of paper and pen handy for any instructions given during your visit. They will also walk you through joining the WebEx smartphone meeting if this is a video visit.    CONSENT FOR TELE-HEALTH VISIT - PLEASE RVIEW  I hereby voluntarily request, consent and authorize [facility] and its employed or contracted physicians, physician assistants, nurse practitioners or other licensed health care professionals (the Practitioner), to provide me with telemedicine health care services (the "Services") as deemed necessary by the treating  Practitioner. I acknowledge and consent to receive the Services by the Practitioner via telemedicine. I understand that the telemedicine visit will involve communicating with the Practitioner through live audiovisual communication technology and the disclosure of certain medical information by electronic transmission. I acknowledge that I have been given the opportunity to request an in-person assessment or other available  alternative prior to the telemedicine visit and am voluntarily participating in the telemedicine visit.  I understand that I have the right to withhold or withdraw my consent to the use of telemedicine in the course of my care at any time, without affecting my right to future care or treatment, and that the Practitioner or I may terminate the telemedicine visit at any time. I understand that I have the right to inspect all information obtained and/or recorded in the course of the telemedicine visit and may receive copies of available information for a reasonable fee.  I understand that some of the potential risks of receiving the Services via telemedicine include:  Marland Kitchen Delay or interruption in medical evaluation due to technological equipment failure or disruption; . Information transmitted may not be sufficient (e.g. poor resolution of images) to allow for appropriate medical decision making by the Practitioner; and/or  . In rare instances, security protocols could fail, causing a breach of personal health information.  Furthermore, I acknowledge that it is my responsibility to provide information about my medical history, conditions and care that is complete and accurate to the best of my ability. I acknowledge that Practitioner's advice, recommendations, and/or decision may be based on factors not within their control, such as incomplete or inaccurate data provided by me or distortions of diagnostic images or specimens that may result from electronic transmissions. I understand that the practice of medicine is not an exact science and that Practitioner makes no warranties or guarantees regarding treatment outcomes. I acknowledge that I will receive a copy of this consent concurrently upon execution via email to the email address I last provided but may also request a printed copy by calling the office of [facility].    I understand that my insurance will be billed for this visit.   I have read or had  this consent read to me. . I understand the contents of this consent, which adequately explains the benefits and risks of the Services being provided via telemedicine.  . I have been provided ample opportunity to ask questions regarding this consent and the Services and have had my questions answered to my satisfaction. . I give my informed consent for the services to be provided through the use of telemedicine in my medical care  By participating in this telemedicine visit I agree to the above. yes

## 2018-11-14 ENCOUNTER — Telehealth: Payer: Self-pay | Admitting: Cardiovascular Disease

## 2018-11-14 ENCOUNTER — Other Ambulatory Visit: Payer: Self-pay

## 2018-11-14 ENCOUNTER — Ambulatory Visit: Payer: Self-pay | Admitting: Cardiovascular Disease

## 2018-11-14 ENCOUNTER — Encounter: Payer: Self-pay | Admitting: Cardiovascular Disease

## 2018-11-14 ENCOUNTER — Telehealth (INDEPENDENT_AMBULATORY_CARE_PROVIDER_SITE_OTHER): Payer: Self-pay | Admitting: Cardiovascular Disease

## 2018-11-14 DIAGNOSIS — I471 Supraventricular tachycardia: Secondary | ICD-10-CM

## 2018-11-14 MED ORDER — METOPROLOL TARTRATE 25 MG PO TABS: 25.0000 mg | ORAL_TABLET | Freq: Two times a day (BID) | ORAL | 3 refills | Status: DC

## 2018-11-14 NOTE — Progress Notes (Signed)
Virtual Visit via Telephone Note    Evaluation Performed:  Follow-up visit  This visit type was conducted due to national recommendations for restrictions regarding the COVID-19 Pandemic (e.g. social distancing).  This format is felt to be most appropriate for this patient at this time.  All issues noted in this document were discussed and addressed.  No physical exam was performed (except for noted visual exam findings with Video Visits).  Please refer to the patient's chart (MyChart message for video visits and phone note for telephone visits) for the patient's consent to telehealth for [facility].  Date:  11/14/2018   ID:  Chelsea Decker, DOB 1961/08/01, MRN [id number]  Patient Location:    Provider location:   [facility]  PCP:  Alba Cory, MD  Cardiologist:  Lorine Bears, MD  Electrophysiologist:  None   Chief Complaint: Follow-up visit  History of Present Illness:    Chelsea Decker is a 58 y.o. female who presents via audio conferencing for a telehealth visit today.   She has known history of paroxysmal supraventricular tachycardia.   She was seen in 2013 for chest pain and palpitations. She underwent a treadmill stress test which showed no evidence of ischemia. However, at peak stress, she went into supraventricular tachycardia with a heart rate of 220 beats per minute which was brief and terminated spontaneously. She had a Holter monitor done which showed PVCs without other arrhythmia. Echocardiogram was normal.  Symptoms of tachycardia improved with metoprolol. She was also evaluated for an episode of syncope which was vasovagal in nature. Pharmacologic nuclear stress test was done in June of 2016 due to recurrent episodes of chest pain. It showed no evidence of ischemia with normal ejection fraction.   She has been doing well overall with no recent palpitations, shortness of breath or chest pain.  The patient does not have symptoms concerning for  COVID-19 infection (fever, chills, cough, or new shortness of breath).    Prior CV studies:   The following studies were reviewed today:    Past Medical History:  Diagnosis Date  . Anxiety   . Asthma   . External hemorrhoids without mention of complication   . GERD (gastroesophageal reflux disease)   . Heart palpitations   . IBS (irritable bowel syndrome)   . Lump or mass in breast   . Palpitations   . Patellar sleeve fracture of left knee 01/03/2018  . PSVT (paroxysmal supraventricular tachycardia) (HCC) 04/12/2012  . Unspecified vitamin D deficiency    Past Surgical History:  Procedure Laterality Date  . ABDOMINAL HYSTERECTOMY     2013  . BREAST BIOPSY Left 2013   core w/clip - neg  . COLONOSCOPY WITH PROPOFOL N/A 06/27/2018   Procedure: COLONOSCOPY WITH PROPOFOL;  Surgeon: Toney Reil, MD;  Location: [facility] ENDOSCOPY;  Service: Gastroenterology;  Laterality: N/A;  . LAPAROSCOPIC TOTAL HYSTERECTOMY       Current Meds  Medication Sig  . acetaminophen (TYLENOL) 500 MG chewable tablet Chew 500 mg by mouth every 6 (six) hours as needed.  . ALPRAZolam (XANAX XR) 0.5 MG 24 hr tablet Take 1 tablet (0.5 mg total) by mouth daily.  Marland Kitchen aspirin 81 MG tablet Take 81 mg by mouth as needed.   . butalbital-acetaminophen-caffeine (FIORICET WITH CODEINE) 50-325-40-30 MG capsule TAKE ONE CAPSULE BY MOUTH EVERY 4 HOURS AS NEEDED FOR HEADACHE  . Cholecalciferol (VITAMIN D) 2000 UNITS tablet Take 2,000 Units by mouth daily.  . citalopram (CELEXA) 20 MG tablet Take  1 tablet (20 mg total) by mouth daily.  Marland Kitchen linaclotide (LINZESS) 72 MCG capsule Take 1 capsule (72 mcg total) by mouth daily. (Patient taking differently: Take 72 mcg by mouth daily as needed. )  . metoprolol tartrate (LOPRESSOR) 25 MG tablet Take 1 tablet (25 mg total) by mouth 2 (two) times daily.  Marland Kitchen triamcinolone cream (KENALOG) 0.1 % Apply 1 application topically 2 (two) times daily. Mix with Eucerin at home and apply on both  inner legs     Allergies:   Patient has no known allergies.   Social History   Tobacco Use  . Smoking status: Never Smoker  . Smokeless tobacco: Never Used  Substance Use Topics  . Alcohol use: No    Alcohol/week: 0.0 standard drinks  . Drug use: No     Family Hx: The patient's family history includes Cancer in her father and mother; Diabetes in her father and mother. There is no history of Breast cancer.  ROS:   Please see the history of present illness.     All other systems reviewed and are negative.   Labs/Other Tests and Data Reviewed:    Recent Labs: 12/10/2017: ALT 18; BUN 16; Creat 0.92; Hemoglobin 12.4; Platelets 175; Potassium 4.0; Sodium 139   Recent Lipid Panel Lab Results  Component Value Date/Time   CHOL 196 12/10/2017 03:10 PM   CHOL 231 (H) 11/05/2015 10:54 AM   TRIG 84 12/10/2017 03:10 PM   HDL 67 12/10/2017 03:10 PM   HDL 80 11/05/2015 10:54 AM   CHOLHDL 2.9 12/10/2017 03:10 PM   LDLCALC 112 (H) 12/10/2017 03:10 PM    Wt Readings from Last 3 Encounters:  11/14/18 135 lb (61.2 kg)  09/13/18 137 lb (62.1 kg)  07/01/18 136 lb 12.8 oz (62.1 kg)     Objective:    Vital Signs:  BP (!) 145/88 (BP Location: Left Arm, Patient Position: Sitting, Cuff Size: Normal)   Pulse (!) 56   Ht 5\' 4"  (1.626 m)   Wt 135 lb (61.2 kg)   BMI 23.17 kg/m      ASSESSMENT & PLAN:    1.  Paroxysmal supraventricular tachycardia: Well-controlled on current dose of metoprolol.  This was refilled today.  2.  Elevated blood pressure without hypertension: Her blood pressure is usually in the normal range.  She thinks that reading was not accurate at home and she is going to get a new blood pressure machine.  Continue to monitor for now.  COVID-19 Education: The signs and symptoms of COVID-19 were discussed with the patient and how to seek care for testing (follow up with PCP or arrange E-visit).  The importance of social distancing was discussed today.  Patient Risk:    After full review of this patient's clinical status, I feel that they are at least moderate risk at this time.  Time:   Today, I have spent 15 minutes with the patient with telehealth technology discussing .     Medication Adjustments/Labs and Tests Ordered: Current medicines are reviewed at length with the patient today.  Concerns regarding medicines are outlined above.  Tests Ordered: No orders of the defined types were placed in this encounter.  Medication Changes: No orders of the defined types were placed in this encounter.   Disposition:  Follow up in 1 year(s)  Signed, Lorine Bears, MD  11/14/2018 11:03 AM    [facility]

## 2018-11-14 NOTE — Patient Instructions (Signed)
Medication Instructions:  Continue same medications If you need a refill on your cardiac medications before your next appointment, please call your pharmacy.   Lab work: None If you have labs (blood work) drawn today and your tests are completely normal, you will receive your results only by: Marland Kitchen MyChart Message (if you have MyChart) OR . A paper copy in the mail If you have any lab test that is abnormal or we need to change your treatment, we will call you to review the results.  Testing/Procedures: None  Follow-Up: At [facility], you and your health needs are our priority.  As part of our continuing mission to provide you with exceptional heart care, we have created designated Provider Care Teams.  These Care Teams include your primary Cardiologist (physician) and Advanced Practice Providers (APPs -  Physician Assistants and Nurse Practitioners) who all work together to provide you with the care you need, when you need it. You will need a follow up appointment in 1 years.  Please call our office 2 months in advance to schedule this appointment.  You may see Lorine Bears, MD or one of the following Advanced Practice Providers on your designated Care Team:   Nicolasa Ducking, NP Eula Listen, PA-C . Marisue Ivan, PA-C  Any Other Special Instructions Will Be Listed Below (If Applicable).

## 2018-11-21 ENCOUNTER — Ambulatory Visit: Payer: Self-pay | Admitting: Cardiovascular Disease

## 2019-02-12 ENCOUNTER — Other Ambulatory Visit: Payer: Self-pay | Admitting: Family Medicine

## 2019-02-12 DIAGNOSIS — F411 Generalized anxiety disorder: Secondary | ICD-10-CM

## 2019-02-12 NOTE — Telephone Encounter (Signed)
Pt is asking for refill on Alprazolam pharm is [facility] [address]

## 2019-02-12 NOTE — Telephone Encounter (Signed)
Refill request for general medication. Alprazolam   Last office visit  09/13/2018   No follow-ups on file.

## 2019-02-13 MED ORDER — ALPRAZOLAM ER 0.5 MG PO TB24: 0.5000 mg | ORAL_TABLET | Freq: Every day | ORAL | 0 refills | Status: DC

## 2019-02-13 NOTE — Telephone Encounter (Signed)
appt scheduled for 7.6.2020

## 2019-02-17 ENCOUNTER — Ambulatory Visit (INDEPENDENT_AMBULATORY_CARE_PROVIDER_SITE_OTHER): Payer: Self-pay | Admitting: Family Medicine

## 2019-02-17 ENCOUNTER — Encounter: Payer: Self-pay | Admitting: Family Medicine

## 2019-02-17 ENCOUNTER — Other Ambulatory Visit: Payer: Self-pay

## 2019-02-17 VITALS — BP 124/68 | HR 74 | Temp 98.6°F | Resp 16 | Ht 64.0 in | Wt 142.4 lb

## 2019-02-17 DIAGNOSIS — R7303 Prediabetes: Secondary | ICD-10-CM

## 2019-02-17 DIAGNOSIS — G44221 Chronic tension-type headache, intractable: Secondary | ICD-10-CM

## 2019-02-17 DIAGNOSIS — I471 Supraventricular tachycardia, unspecified: Secondary | ICD-10-CM

## 2019-02-17 DIAGNOSIS — K219 Gastro-esophageal reflux disease without esophagitis: Secondary | ICD-10-CM

## 2019-02-17 DIAGNOSIS — F411 Generalized anxiety disorder: Secondary | ICD-10-CM

## 2019-02-17 DIAGNOSIS — F320 Major depressive disorder, single episode, mild: Secondary | ICD-10-CM

## 2019-02-17 DIAGNOSIS — Z8781 Personal history of (healed) traumatic fracture: Secondary | ICD-10-CM

## 2019-02-17 DIAGNOSIS — F325 Major depressive disorder, single episode, in full remission: Secondary | ICD-10-CM

## 2019-02-17 MED ORDER — CITALOPRAM HYDROBROMIDE 20 MG PO TABS: 20.0000 mg | ORAL_TABLET | Freq: Every day | ORAL | 1 refills | Status: DC

## 2019-02-17 MED ORDER — BUTALBITAL-APAP-CAFF-COD 50-325-40-30 MG PO CAPS: 1.0000 | ORAL_CAPSULE | Freq: Four times a day (QID) | ORAL | 0 refills | Status: DC | PRN

## 2019-02-17 MED ORDER — ALPRAZOLAM ER 0.5 MG PO TB24: 0.5000 mg | ORAL_TABLET | Freq: Every day | ORAL | 3 refills | Status: DC

## 2019-02-17 NOTE — Progress Notes (Signed)
Name: Chelsea Decker   MRN: [id number]    DOB: Oct 26, 1960   Date:02/17/2019       Progress Note  Subjective  Chief Complaint  Chief Complaint  Patient presents with  . Medication Refill  . Depression  . SVT  . GAD  . Gastroesophageal Reflux  . IBS with constipation    HPI  Tension Headaches: she has episodes of headaches pain is described as a throbbing or sharp sensation, and radiates to right side of neck, sometimes on left side, at times on temporal area. No photophobia or phonophobia, or focal deficits. Lasts about one hour. Episodes about twice a week and she has medication at home   IBS with constipation: symptoms have improved with Linzess, she takes it a couple of times a week and her bowel movements daily and usually [place] can vary from a 4 to a 7, occasionally has abdominal pain, but doing much better at home   GERD:she is off medication, she states occasionally has epigastric pain but and has noticed some heartburn, she states she is eating more lately and has gained weight    SVT: seen by Dr. Fletcher Decker, stress test in 2013 showed HR went up to 220 with maximum activity,she is avoiding chocolate and caffeine.She is on Metoprolol. Last visit toto EC was in July 2018and itwas secondary to panic attack causing chest pain. No recent episodes of tachycardia lately based on her fibit Saw Dr. Fletcher Decker April 2020   GAD: Chelsea Decker had to take some time off work back in September 2016 because of severe anxiety associated with her work. .She is taking Citalopram and Alprazolam, she needs refills of her medications.   Pre-diabetes: no polyphagia, polydipsia or polyuria, last hgbA1Cwas 5.8%. She is eating more and has gained weight, she has been walking 3 times a week, but not as active as when she used to work    Patient Active Problem List   Diagnosis Date Noted  . Mild major depression ([place]) 09/13/2018  . Encounter for screening colonoscopy   . History of shingles  04/17/2017  . Post-menopause on HRT (hormone replacement therapy) 11/05/2015  . Fasting hyperglycemia 08/10/2015  . Anxiety, generalized 03/20/2015  . Bradycardia 03/20/2015  . Chronic tension-type headache, intractable 03/20/2015  . Gastro-esophageal reflux disease without esophagitis 03/20/2015  . IBS (irritable bowel syndrome) 03/20/2015  . Menopause 03/20/2015  . Vitamin D deficiency 03/20/2015  . Vasovagal syncope 08/18/2014  . PSVT (paroxysmal supraventricular tachycardia) ([place]) 04/12/2012  . External hemorrhoids 07/15/2007    Past Surgical History:  Procedure Laterality Date  . ABDOMINAL HYSTERECTOMY     2013  . BREAST BIOPSY Left 2013   core w/clip - neg  . COLONOSCOPY WITH PROPOFOL N/A 06/27/2018   Procedure: COLONOSCOPY WITH PROPOFOL;  Surgeon: Lin Landsman, MD;  Location: [facility] ENDOSCOPY;  Service: Gastroenterology;  Laterality: N/A;  . LAPAROSCOPIC TOTAL HYSTERECTOMY      Family History  Problem Relation Age of Onset  . Cancer Mother   . Diabetes Mother   . Cancer Father   . Diabetes Father   . Breast cancer Neg Hx     Social History   Socioeconomic History  . Marital status: Divorced    Spouse name: Not on file  . Number of children: 2  . Years of education: Not on file  . Highest education level: High school graduate  Occupational History  . Not on file  Social Needs  . Financial resource strain: Somewhat hard  . Food insecurity  Worry: Never true    Inability: Never true  . Transportation needs    Medical: No    Non-medical: No  Tobacco Use  . Smoking status: Never Smoker  . Smokeless tobacco: Never Used  Substance and Sexual Activity  . Alcohol use: No    Alcohol/week: 0.0 standard drinks  . Drug use: No  . Sexual activity: Yes    Partners: Male  Lifestyle  . Physical activity    Days per week: 0 days    Minutes per session: 0 min  . Stress: Only a little  Relationships  . Social Musicianconnections    Talks on phone: Once a week     Gets together: Once a week    Attends religious service: More than 4 times per year    Active member of club or organization: Yes    Attends meetings of clubs or organizations: More than 4 times per year    Relationship status: Divorced  . Intimate partner violence    Fear of current or ex partner: No    Emotionally abused: No    Physically abused: No    Forced sexual activity: No  Other Topics Concern  . Not on file  Social History Narrative   Divorced   Dating Fayrene FearingJames since 2015   Lost her job at [employer] and is looking for another job    Her youngest child - son, has disability since birth, MR      Current Outpatient Medications:  .  acetaminophen (TYLENOL) 500 MG chewable tablet, Chew 500 mg by mouth every 6 (six) hours as needed., Disp: , Rfl:  .  ALPRAZolam (XANAX XR) 0.5 MG 24 hr tablet, Take 1 tablet (0.5 mg total) by mouth daily., Disp: 5 tablet, Rfl: 0 .  aspirin 81 MG tablet, Take 81 mg by mouth as needed. , Disp: , Rfl:  .  butalbital-acetaminophen-caffeine (FIORICET WITH CODEINE) 50-325-40-30 MG capsule, TAKE ONE CAPSULE BY MOUTH EVERY 4 HOURS AS NEEDED FOR HEADACHE, Disp: 30 capsule, Rfl: 0 .  Cholecalciferol (VITAMIN D) 2000 UNITS tablet, Take 2,000 Units by mouth daily., Disp: , Rfl:  .  citalopram (CELEXA) 20 MG tablet, Take 1 tablet (20 mg total) by mouth daily., Disp: 30 tablet, Rfl: 2 .  linaclotide (LINZESS) 72 MCG capsule, Take 1 capsule (72 mcg total) by mouth daily. (Patient taking differently: Take 72 mcg by mouth daily as needed. ), Disp: 30 capsule, Rfl: 2 .  metoprolol tartrate (LOPRESSOR) 25 MG tablet, Take 1 tablet (25 mg total) by mouth 2 (two) times daily., Disp: 180 tablet, Rfl: 3 .  naproxen sodium (ANAPROX) 550 MG tablet, naproxen sodium 550 mg tablet  Take 1 tablet every 12 hours by oral route as needed., Disp: , Rfl:  .  traMADol (ULTRAM) 50 MG tablet, Take 50 mg by mouth every 6 (six) hours as needed for moderate pain (knee pain)., Disp:  , Rfl:  .  triamcinolone cream (KENALOG) 0.1 %, Apply 1 application topically 2 (two) times daily. Mix with Eucerin at home and apply on both inner legs, Disp: 80 g, Rfl: 0  No Known Allergies  I personally reviewed active problem list, medication list, allergies, family history, social history with the patient/caregiver today.   ROS  Constitutional: Negative for fever , positive for weight change.  Respiratory: Negative for cough and shortness of breath.   Cardiovascular: Negative for chest pain or palpitations.  Gastrointestinal: Negative for abdominal pain, no bowel changes.  Musculoskeletal: Negative for  gait problem or joint swelling.  Skin: Negative for rash.  Neurological: Negative for dizziness or headache.  No other specific complaints in a complete review of systems (except as listed in HPI above).  Objective  Vitals:   02/17/19 1407  BP: 124/68  Pulse: 74  Resp: 16  Temp: 98.6 F (37 C)  TempSrc: Oral  SpO2: 99%  Weight: 142 lb 6.4 oz (64.6 kg)  Height: 5\' 4"  (1.626 m)    Body mass index is 24.44 kg/m.  Physical Exam  Constitutional: Patient appears well-developed and well-nourished.  No distress.  HEENT: head atraumatic, normocephalic, pupils equal and reactive to light,  neck supple Cardiovascular: Normal rate, regular rhythm and normal heart sounds.  No murmur heard. No BLE edema. Pulmonary/Chest: Effort normal and breath sounds normal. No respiratory distress. Abdominal: Soft.  There is no tenderness. Psychiatric: Patient has a normal mood and affect. behavior is normal. Judgment and thought content normal.  PHQ2/9: Depression [facility] 2/9 02/17/2019 09/13/2018 07/01/2018 06/12/2018 03/11/2018  Decreased Interest 0 0 0 2 0  Down, Depressed, Hopeless 0 0 0 2 0  PHQ - 2 Score 0 0 0 4 0  Altered sleeping 0 0 0 0 0  Tired, decreased energy 1 2 0 2 2  Change in appetite 0 0 0 0 0  Feeling bad or failure about yourself  0 0 0 0 0  Trouble concentrating 0  0 0 0 0  Moving slowly or fidgety/restless 0 0 0 0 0  Suicidal thoughts 0 0 0 0 0  PHQ-9 Score 1 2 0 6 2  Difficult doing work/chores Not difficult at all - Not difficult at all Not difficult at all Not difficult at all  Some recent data might be hidden    phq 9 is negative    Fall Risk: Fall Risk  02/17/2019 09/13/2018 07/01/2018 06/12/2018 03/11/2018  Falls in the past year? 0 0 1 Yes Yes  Comment Fell on Jan 03, 2018 left knee pain - - - -  Number falls in past yr: 0 0 1 1 1   Comment - - - Jan 03, 2018 -  Injury with Fall? 0 0 1 Yes Yes  Comment - - - Left Knee cap Left Knee      Functional Status Survey: Is the patient deaf or have difficulty hearing?: No Does the patient have difficulty seeing, even when wearing glasses/contacts?: Yes Does the patient have difficulty concentrating, remembering, or making decisions?: No Does the patient have difficulty walking or climbing stairs?: No Does the patient have difficulty dressing or bathing?: No Does the patient have difficulty doing errands alone such as visiting a doctor's office or shopping?: No    Assessment & Plan  1. Anxiety, generalized  - ALPRAZolam (XANAX XR) 0.5 MG 24 hr tablet; Take 1 tablet (0.5 mg total) by mouth daily.  Dispense: 30 tablet; Refill: 3 - citalopram (CELEXA) 20 MG tablet; Take 1 tablet (20 mg total) by mouth daily.  Dispense: 90 tablet; Refill: 1  2. PSVT (paroxysmal supraventricular tachycardia) (HCC)  Under the care of Dr. Kirke CorinArida  3. Major depression in remission (HCC)  - citalopram (CELEXA) 20 MG tablet; Take 1 tablet (20 mg total) by mouth daily.  Dispense: 90 tablet; Refill: 1  4. Pre-diabetes   5. Chronic tension-type headache, intractable  - butalbital-acetaminophen-caffeine (FIORICET WITH CODEINE) 50-325-40-30 MG capsule; Take 1 capsule by mouth every 6 (six) hours as needed for headache.  Dispense: 30 capsule; Refill: 0  6.  Gastro-esophageal reflux disease without  esophagitis  Resume  Healthier diet   7. History of patellar fracture  She will follow up with ortho, having left knee pain again

## 2019-05-21 ENCOUNTER — Ambulatory Visit: Payer: Self-pay | Admitting: Family Medicine

## 2019-06-20 ENCOUNTER — Ambulatory Visit: Payer: Self-pay | Admitting: Family Medicine

## 2019-10-15 ENCOUNTER — Ambulatory Visit (INDEPENDENT_AMBULATORY_CARE_PROVIDER_SITE_OTHER): Payer: Self-pay | Admitting: Family Medicine

## 2019-10-15 ENCOUNTER — Encounter: Payer: Self-pay | Admitting: Family Medicine

## 2019-10-15 DIAGNOSIS — F325 Major depressive disorder, single episode, in full remission: Secondary | ICD-10-CM

## 2019-10-15 DIAGNOSIS — G44221 Chronic tension-type headache, intractable: Secondary | ICD-10-CM

## 2019-10-15 DIAGNOSIS — F411 Generalized anxiety disorder: Secondary | ICD-10-CM

## 2019-10-15 MED ORDER — CITALOPRAM HYDROBROMIDE 20 MG PO TABS: 20.0000 mg | ORAL_TABLET | Freq: Every day | ORAL | 1 refills | Status: DC

## 2019-10-15 MED ORDER — BUTALBITAL-APAP-CAFF-COD 50-325-40-30 MG PO CAPS: 1.0000 | ORAL_CAPSULE | Freq: Four times a day (QID) | ORAL | 0 refills | Status: DC | PRN

## 2019-10-15 MED ORDER — ALPRAZOLAM ER 0.5 MG PO TB24: 0.5000 mg | ORAL_TABLET | Freq: Every day | ORAL | 3 refills | Status: DC

## 2019-10-15 NOTE — Progress Notes (Signed)
Name: Chelsea Decker   MRN: [id number]    DOB: [birth year]   Date:10/15/2019       Progress Note  Subjective  Chief Complaint  Chief Complaint  Patient presents with  . Medication Refill    Aspirin 81 mg, Vitamin D3, Alprazolam, Citalopram, Fiorcet, Tramadol  . Loss of Consciousness    She passed out after she got home from taking the 2nd Covid vaccine.    I connected with  RUTHY FORRY on 10/15/19 at 10:00 AM EST by telephone and verified that I am speaking with the correct person using two identifiers.  I discussed the limitations, risks, security and privacy concerns of performing an evaluation and management service by telephone and the availability of in person appointments. Staff also discussed with the patient that there may be a patient responsible charge related to this service. 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 She is not as stressed now. She feels appreciated . She is not feeling depressed. She is taking Celexa and alprazolam daily and it helps with her depression and anxiety. No side effects   Tension Headaches: she has episodes of headaches pain is described as a throbbing or sharp sensation, and radiates to right side of neck, sometimes on left side, at times on temporal area. No photophobia or phonophobia, or focal deficits. Lasts about one hour. Episodes are about twice a month, she still has a few pills left but needs a refill   Syncope: she had a syncopal episode when she arrived at home after COVID-19 vaccine. She states it was very brief, she was able to hear her boyfriend but everything was black for a while, she did not call 911, her sister with contact vaccine [employer] to report it . She is back to normal now.   Patient Active Problem List   Diagnosis Date Noted    . Mild major depression (H[facility]) 09/13/2018  . Encounter for screening colonoscopy   . History of shingles 04/17/2017  . Post-menopause on HRT (hormone replacement therapy) 11/05/2015  . Fasting hyperglycemia 08/10/2015  . Anxiety, generalized 03/20/2015  . Bradycardia 03/20/2015  . Chronic tension-type headache, intractable 03/20/2015  . Gastro-esophageal reflux disease without esophagitis 03/20/2015  . IBS (irritable bowel syndrome) 03/20/2015  . Menopause 03/20/2015  . Vitamin D deficiency 03/20/2015  . Vasovagal syncope 08/18/2014  . PSVT (paroxysmal supraventricular tachycardia) ([facility]) 04/12/2012  . External hemorrhoids 07/15/2007    Past Surgical History:  Procedure Laterality Date  . ABDOMINAL HYSTERECTOMY     2013  . BREAST BIOPSY Left 2013   core w/clip - neg  . COLONOSCOPY WITH PROPOFOL N/A 06/27/2018   Procedure: COLONOSCOPY WITH PROPOFOL;  Surgeon: Toney Reil, MD;  Location: [facility] ENDOSCOPY;  Service: Gastroenterology;  Laterality: N/A;  . LAPAROSCOPIC TOTAL HYSTERECTOMY      Family History  Problem Relation Age of Onset  . Cancer Mother   . Diabetes Mother   . Cancer Father   . Diabetes Father   . Breast cancer Neg Hx     Social History   Tobacco Use  . Smoking status: Never Smoker  . Smokeless tobacco: Never Used  Substance Use Topics  . Alcohol use: No    Alcohol/week: 0.0 standard drinks    Current Outpatient Medications:  .  acetaminophen (TYLENOL) 500 MG chewable  tablet, Chew 500 mg by mouth every 6 (six) hours as needed., Disp: , Rfl:  .  ALPRAZolam (XANAX XR) 0.5 MG 24 hr tablet, Take 1 tablet (0.5 mg total) by mouth daily., Disp: 30 tablet, Rfl: 3 .  aspirin 81 MG tablet, Take 81 mg by mouth as needed. , Disp: , Rfl:  .  butalbital-acetaminophen-caffeine (FIORICET WITH CODEINE) 50-325-40-30 MG capsule, Take 1 capsule by mouth every 6 (six) hours as needed for headache., Disp: 30 capsule, Rfl: 0 .  Cholecalciferol (VITAMIN D) 2000 UNITS  tablet, Take 2,000 Units by mouth daily., Disp: , Rfl:  .  citalopram (CELEXA) 20 MG tablet, Take 1 tablet (20 mg total) by mouth daily., Disp: 90 tablet, Rfl: 1 .  linaclotide (LINZESS) 72 MCG capsule, Take 1 capsule (72 mcg total) by mouth daily. (Patient taking differently: Take 72 mcg by mouth daily as needed. ), Disp: 30 capsule, Rfl: 2 .  metoprolol tartrate (LOPRESSOR) 25 MG tablet, Take 1 tablet (25 mg total) by mouth 2 (two) times daily., Disp: 180 tablet, Rfl: 3 .  naproxen sodium (ANAPROX) 550 MG tablet, naproxen sodium 550 mg tablet  Take 1 tablet every 12 hours by oral route as needed., Disp: , Rfl:  .  traMADol (ULTRAM) 50 MG tablet, Take 50 mg by mouth every 6 (six) hours as needed for moderate pain (knee pain)., Disp: , Rfl:  .  triamcinolone cream (KENALOG) 0.1 %, Apply 1 application topically 2 (two) times daily. Mix with Eucerin at home and apply on both inner legs, Disp: 80 g, Rfl: 0  No Known Allergies  I personally reviewed active problem list, medication list, allergies, family history, social history with the patient/caregiver today.   ROS  Ten systems reviewed and is negative except as mentioned in HPI   Objective  Virtual encounter, vitals not obtained.  There is no height or weight on file to calculate BMI.  Physical Exam  Awake, alert and oriented   PHQ2/9: Depression screen [facility] 2/9 10/15/2019 02/17/2019 09/13/2018 07/01/2018 06/12/2018  Decreased Interest 0 0 0 0 2  Down, Depressed, Hopeless 0 0 0 0 2  PHQ - 2 Score 0 0 0 0 4  Altered sleeping 0 0 0 0 0  Tired, decreased energy 0 1 2 0 2  Change in appetite 0 0 0 0 0  Feeling bad or failure about yourself  0 0 0 0 0  Trouble concentrating 0 0 0 0 0  Moving slowly or fidgety/restless 0 0 0 0 0  Suicidal thoughts 0 0 0 0 0  PHQ-9 Score 0 1 2 0 6  Difficult doing work/chores - Not difficult at all - Not difficult at all Not difficult at all  Some recent data might be hidden   PHQ-2/9 Result is negative.     Fall Risk: Fall Risk  10/15/2019 02/17/2019 09/13/2018 07/01/2018 06/12/2018  Falls in the past year? 1 0 0 1 Yes  Comment - Fell on Jan 03, 2018 left knee pain - - -  Number falls in past yr: 0 0 0 1 1  Comment - - - - Jan 03, 2018  Injury with Fall? 1 0 0 1 Yes  Comment - - - - Left Knee cap     Assessment & Plan  1. Anxiety, generalized  - citalopram (CELEXA) 20 MG tablet; Take 1 tablet (20 mg total) by mouth daily.  Dispense: 90 tablet; Refill: 1 - ALPRAZolam (XANAX XR) 0.5 MG 24 hr tablet; Take 1  tablet (0.5 mg total) by mouth daily.  Dispense: 30 tablet; Refill: 3  2. Major depression in remission (HCC)  - citalopram (CELEXA) 20 MG tablet; Take 1 tablet (20 mg total) by mouth daily.  Dispense: 90 tablet; Refill: 1  3. Chronic tension-type headache, intractable  - butalbital-acetaminophen-caffeine (FIORICET WITH CODEINE) 50-325-40-30 MG capsule; Take 1 capsule by mouth every 6 (six) hours as needed for headache.  Dispense: 30 capsule; Refill: 0  I discussed the assessment and treatment plan with the patient. The patient was provided an opportunity to ask questions and all were answered. The patient agreed with the plan and demonstrated an understanding of the instructions.   The patient was advised to call back or seek an in-person evaluation if the symptoms worsen or if the condition fails to improve as anticipated.  I provided 15  minutes of non-face-to-face time during this encounter.  Ruel Favors, MD

## 2020-09-13 ENCOUNTER — Telehealth: Payer: Self-pay | Admitting: Cardiovascular Disease

## 2020-09-13 NOTE — Telephone Encounter (Signed)
4 attempts to schedule fu appt from recall list.   Deleting recall.   

## 2020-10-05 NOTE — Progress Notes (Signed)
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 She is not as stressed now. She feels appreciated . She is not feeling depressed. She is taking Celexa a but has not been taking alprazolam XR, we will discontinue that and give her 20 pills of alprazolam to take prn only  Tension Headaches: she has episodes of headaches pain is described as a throbbing or sharp sensation, and radiates to right side of neck, sometimes on left side, at times on temporal area. No photophobia or phonophobia, or focal deficits. Lasts about one hour. Episodes are about twice a month and takes prn medication.   PSVT: she does not have insurance at this time, going to cardiologist is too expensive, I will fill rx of lopressor to take BID. She has intermittent episodes of palpitation , at most twice a month it lasts 20 minutes and resolves by itself, no chest pain or diaphoresis. She states doing better   GERD: states symptoms of heartburn is under control  Pre-diabetes: last A1C was elevated, discussed low sugar diet, no polyphagia, polydipsia or polyuria    Patient Active Problem List   Diagnosis Date Noted  . Mild major depression (HCC) 09/13/2018  . Encounter for screening colonoscopy   . History of shingles 04/17/2017  . Post-menopause on HRT (hormone replacement therapy) 11/05/2015  . Fasting hyperglycemia 08/10/2015  . Anxiety, generalized 03/20/2015  . Bradycardia 03/20/2015  . Chronic tension-type headache, intractable 03/20/2015  . Gastro-esophageal reflux disease without esophagitis 03/20/2015  . IBS (irritable bowel syndrome) 03/20/2015  . Menopause 03/20/2015  . Vitamin D deficiency 03/20/2015  . Vasovagal syncope 08/18/2014  . PSVT (paroxysmal supraventricular tachycardia) (HCC)  04/12/2012  . External hemorrhoids 07/15/2007    Past Surgical History:  Procedure Laterality Date  . ABDOMINAL HYSTERECTOMY     2013  . BREAST BIOPSY Left 2013   core w/clip - neg  . COLONOSCOPY WITH PROPOFOL N/A 06/27/2018   Procedure: COLONOSCOPY WITH PROPOFOL;  Surgeon: Toney Reil, MD;  Location: [facility] ENDOSCOPY;  Service: Gastroenterology;  Laterality: N/A;  . LAPAROSCOPIC TOTAL HYSTERECTOMY      Family History  Problem Relation Age of Onset  . Cancer Mother   . Diabetes Mother   . Cancer Father   . Diabetes Father   . Breast cancer Neg Hx     Social History   Tobacco Use  . Smoking status: Never Smoker  . Smokeless tobacco: Never Used  Substance Use Topics  . Alcohol use: No    Alcohol/week: 0.0 standard drinks     Current Outpatient Medications:  .  acetaminophen (TYLENOL) 500 MG chewable tablet, Chew 500 mg by mouth every 6 (six) hours as needed., Disp: , Rfl:  .  ALPRAZolam (XANAX XR) 0.5 MG 24 hr tablet, Take 1 tablet (0.5 mg total) by mouth daily., Disp: 30 tablet, Rfl: 3 .  aspirin 81 MG tablet, Take 81 mg by mouth as needed. , Disp: , Rfl:  .  butalbital-acetaminophen-caffeine (FIORICET WITH CODEINE) 50-325-40-30 MG capsule, Take 1 capsule by mouth every 6 (six) hours as needed for headache., Disp: 30 capsule, Rfl: 0 .  Cholecalciferol (VITAMIN D) 2000 UNITS tablet, Take 2,000 Units by mouth daily., Disp: , Rfl:  .  citalopram (  CELEXA) 20 MG tablet, Take 1 tablet (20 mg total) by mouth daily., Disp: 90 tablet, Rfl: 1 .  linaclotide (LINZESS) 72 MCG capsule, Take 1 capsule (72 mcg total) by mouth daily. (Patient taking differently: Take 72 mcg by mouth daily as needed.), Disp: 30 capsule, Rfl: 2 .  metoprolol tartrate (LOPRESSOR) 25 MG tablet, Take 1 tablet (25 mg total) by mouth 2 (two) times daily., Disp: 180 tablet, Rfl: 3 .  naproxen sodium (ANAPROX) 550 MG tablet, naproxen sodium 550 mg tablet  Take 1 tablet every 12 hours by oral route as needed.,  Disp: , Rfl:  .  triamcinolone cream (KENALOG) 0.1 %, Apply 1 application topically 2 (two) times daily. Mix with Eucerin at home and apply on both inner legs, Disp: 80 g, Rfl: 0  No Known Allergies  I personally reviewed active problem list, medication list, allergies, family history, social history, health maintenance with the patient/caregiver today.   ROS  Constitutional: Negative for fever or weight change.  Respiratory: Negative for cough and shortness of breath.   Cardiovascular: Negative for chest pain or palpitations.  Gastrointestinal: Negative for abdominal pain, no bowel changes.  Musculoskeletal: Negative for gait problem or joint swelling.  Skin: Negative for rash.  Neurological: Negative for dizziness or headache.  No other specific complaints in a complete review of systems (except as listed in HPI above).  Objective  Vitals:   10/06/20 1124  BP: 112/72  Pulse: 70  Resp: 14  Temp: 98 F (36.7 C)  TempSrc: Oral  SpO2: 97%  Weight: 142 lb 6.4 oz (64.6 kg)  Height: 5\' 4"  (1.626 m)    Body mass index is 24.44 kg/m.  Physical Exam  Constitutional: Patient appears well-developed and well-nourished. No distress.  HEENT: head atraumatic, normocephalic, pupils equal and reactive to light, eaneck supple Cardiovascular: Normal rate, regular rhythm and normal heart sounds.  No murmur heard. No BLE edema. Pulmonary/Chest: Effort normal and breath sounds normal. No respiratory distress. Abdominal: Soft.  There is no tenderness. Psychiatric: Patient has a normal mood and affect. behavior is normal. Judgment and thought content  normal.  PHQ2/9: Depression screen [facility] 2/9 10/06/2020 10/15/2019 02/17/2019 09/13/2018 07/01/2018  Decreased Interest 0 0 0 0 0  Down, Depressed, Hopeless 0 0 0 0 0  PHQ - 2 Score 0 0 0 0 0  Altered sleeping 0 0 0 0 0  Tired, decreased energy 0 0 1 2 0  Change in appetite 0 0 0 0 0  Feeling bad or failure about yourself  0 0 0 0 0  Trouble  concentrating 0 0 0 0 0  Moving slowly or fidgety/restless 0 0 0 0 0  Suicidal thoughts 0 0 0 0 0  PHQ-9 Score 0 0 1 2 0  Difficult doing work/chores - - Not difficult at all - Not difficult at all  Some recent data might be hidden    phq 9 is negative   Fall Risk: Fall Risk  10/06/2020 10/15/2019 02/17/2019 09/13/2018 07/01/2018  Falls in the past year? 0 1 0 0 1  Comment - - 07/03/2018 on Jan 03, 2018 left knee pain - -  Number falls in past yr: 0 0 0 0 1  Comment - - - - -  Injury with Fall? 0 1 0 0 1  Comment - - - - -    Functional Status Survey: Is the patient deaf or have difficulty hearing?: No Does the patient have difficulty seeing, even when wearing glasses/contacts?:  No Does the patient have difficulty concentrating, remembering, or making decisions?: No Does the patient have difficulty walking or climbing stairs?: No Does the patient have difficulty dressing or bathing?: No Does the patient have difficulty doing errands alone such as visiting a doctor's office or shopping?: No    Assessment & Plan  1. Major depression in remission (HCC)  - citalopram (CELEXA) 20 MG tablet; Take 1 tablet (20 mg total) by mouth daily.  Dispense: 90 tablet; Refill: 1  2. Anxiety, generalized  - citalopram (CELEXA) 20 MG tablet; Take 1 tablet (20 mg total) by mouth daily.  Dispense: 90 tablet; Refill: 1 - ALPRAZolam (XANAX) 0.25 MG tablet; Take 1 tablet (0.25 mg total) by mouth 2 (two) times daily as needed for anxiety.  Dispense: 20 tablet; Refill: 0  3. Need for Tdap vaccination  - Tdap vaccine greater than or equal to 7yo IM  4. Breast cancer screening by mammogram  - MM 3D SCREEN BREAST BILATERAL; Future  5. Need for influenza vaccination  - Flu Vaccine QUAD 6+ mos PF IM (Fluarix Quad PF)  6. Chronic tension-type headache, intractable  - butalbital-acetaminophen-caffeine (FIORICET WITH CODEINE) 50-325-40-30 MG capsule; Take 1 capsule by mouth every 6 (six) hours as needed for  headache.  Dispense: 30 capsule; Refill: 0  7. Gastro-esophageal reflux disease without esophagitis   8. Pre-diabetes   9. PSVT (paroxysmal supraventricular tachycardia) (HCC)  - metoprolol tartrate (LOPRESSOR) 25 MG tablet; Take 1 tablet (25 mg total) by mouth 2 (two) times daily.  Dispense: 180 tablet; Refill: 1  10. Dermatitis  - triamcinolone (KENALOG) 0.1 %; Apply 1 application topically 2 (two) times daily. Mix with Eucerin at home and apply on both inner legs  Dispense: 80 g; Refill: 0

## 2020-10-06 ENCOUNTER — Ambulatory Visit: Payer: Self-pay | Admitting: Family Medicine

## 2020-10-06 ENCOUNTER — Other Ambulatory Visit: Payer: Self-pay

## 2020-10-06 ENCOUNTER — Encounter: Payer: Self-pay | Admitting: Family Medicine

## 2020-10-06 VITALS — BP 112/72 | HR 70 | Temp 98.0°F | Resp 14 | Ht 64.0 in | Wt 142.4 lb

## 2020-10-06 DIAGNOSIS — K219 Gastro-esophageal reflux disease without esophagitis: Secondary | ICD-10-CM

## 2020-10-06 DIAGNOSIS — Z23 Encounter for immunization: Secondary | ICD-10-CM

## 2020-10-06 DIAGNOSIS — L309 Dermatitis, unspecified: Secondary | ICD-10-CM

## 2020-10-06 DIAGNOSIS — I471 Supraventricular tachycardia: Secondary | ICD-10-CM

## 2020-10-06 DIAGNOSIS — F411 Generalized anxiety disorder: Secondary | ICD-10-CM

## 2020-10-06 DIAGNOSIS — G44221 Chronic tension-type headache, intractable: Secondary | ICD-10-CM

## 2020-10-06 DIAGNOSIS — Z1231 Encounter for screening mammogram for malignant neoplasm of breast: Secondary | ICD-10-CM

## 2020-10-06 DIAGNOSIS — F325 Major depressive disorder, single episode, in full remission: Secondary | ICD-10-CM

## 2020-10-06 DIAGNOSIS — R7303 Prediabetes: Secondary | ICD-10-CM

## 2020-10-06 MED ORDER — CITALOPRAM HYDROBROMIDE 20 MG PO TABS: 20.0000 mg | ORAL_TABLET | Freq: Every day | ORAL | 1 refills | Status: DC

## 2020-10-06 MED ORDER — METOPROLOL TARTRATE 25 MG PO TABS: 25.0000 mg | ORAL_TABLET | Freq: Two times a day (BID) | ORAL | 1 refills | Status: DC

## 2020-10-06 MED ORDER — TRIAMCINOLONE ACETONIDE 0.1 % EX CREA: 1.0000 "application " | TOPICAL_CREAM | Freq: Two times a day (BID) | CUTANEOUS | 0 refills | Status: DC

## 2020-10-06 MED ORDER — BUTALBITAL-APAP-CAFF-COD 50-325-40-30 MG PO CAPS: 1.0000 | ORAL_CAPSULE | Freq: Four times a day (QID) | ORAL | 0 refills | Status: DC | PRN

## 2020-10-06 MED ORDER — ALPRAZOLAM 0.25 MG PO TABS: 0.2500 mg | ORAL_TABLET | Freq: Two times a day (BID) | ORAL | 0 refills | Status: DC | PRN

## 2020-10-21 ENCOUNTER — Ambulatory Visit: Payer: Self-pay | Admitting: Cardiovascular Disease

## 2020-11-02 ENCOUNTER — Other Ambulatory Visit: Payer: Self-pay

## 2020-11-02 ENCOUNTER — Ambulatory Visit
Admission: RE | Admit: 2020-11-02 | Discharge: 2020-11-02 | Disposition: A | Discharge status: Home / Self Care | Payer: Self-pay | Source: Ambulatory Visit | Attending: Oncology | Admitting: Oncology

## 2020-11-02 ENCOUNTER — Ambulatory Visit: Payer: Self-pay | Attending: Oncology

## 2020-11-02 VITALS — BP 111/78 | HR 54 | Temp 95.8°F | Ht 66.5 in | Wt 146.5 lb

## 2020-11-02 DIAGNOSIS — Z0000 Encounter for general adult medical examination without abnormal findings: Secondary | ICD-10-CM

## 2020-11-02 NOTE — Progress Notes (Signed)
°  Subjective:     Patient ID: Chelsea Decker, female   DOB: 10/03/1960, 60 y.o.   MRN: [id number]  HPI   Review of Systems     Objective:   Physical Exam Chest:  Breasts:     Right: No swelling, bleeding, inverted nipple, mass, nipple discharge, skin change or tenderness.     Left: No swelling, bleeding, inverted nipple, mass, nipple discharge, skin change or tenderness.          Assessment:     60 year old patient presents for [facility] clinic visit. Patient screened, and meets BCCCP eligibility.  Patient does not have insurance, Medicare or Medicaid. Instructed patient on breast self awareness using teach back method.  Clinical breast exam unremarkable. No mass or lump palpated.   Risk Assessment    Risk Scores      11/02/2020   Last edited by: Jim Like, RN   5-year risk: 1.4 %   Lifetime risk: 7.1 %        Sent for bilateral screening mammogram.    Plan:    Sent for bilateral screening mammogram.

## 2020-11-03 NOTE — Progress Notes (Signed)
Letter mailed from [facility] to notify of normal mammogram results.  Patient to return in one year for annual screening.  Copy to [facility]. 

## 2021-04-05 NOTE — Progress Notes (Signed)
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 She is managing her stress.  She was taking Celexa daiy but ran out of medication and did not get the refill that is alrady at the pharmacy. She is taking the alprazolam seldom now, only has prn dose   Tension Headaches: She has episodes of headaches  pain is described as a throbbing or sharp sensation, and radiates to right side of neck, sometimes on left side, at times on temporal area. No photophobia or phonophobia, or focal deficits. She says that the headaches last for about 2-3 hours and having them about once a week.  She takes her Fioricet PRN, and is tolerating it well.  She needs a refill today   PSVT: she does not have insurance at this time, going to cardiologist is too expensive, She has been taking her lopressor BID.  She still has palpitations about twice a week.  She denies chest pain or shortness of breath.  Symptoms stable.   GERD: She says that her heartburn is well controlled.   Pre-diabetes: last A1C was 5.8 in 11/2017, She denies any polyphagia, polydipsia or polyuria.  She is following a diabetic diet, she is wiling to have labs done today   Viatamin D deficiency:  She has been taking her vitamin D, daily. We will not check labs since patient does not have insurance and is self pay  Rash on left arm/ left side of abdomen: She was in the yard, doing yard work last week.  Noticed an itchy, blistery rash to her left forearm and now spreading to the left flank area   Patient Active Problem List   Diagnosis Date Noted   Mild major depression (HCC) 09/13/2018   Encounter for screening colonoscopy    History of shingles 04/17/2017   Post-menopause on HRT (hormone replacement therapy) 11/05/2015   Fasting hyperglycemia 08/10/2015    Anxiety, generalized 03/20/2015   Bradycardia 03/20/2015   Chronic tension-type headache, intractable 03/20/2015   Gastro-esophageal reflux disease without esophagitis 03/20/2015   IBS (irritable bowel syndrome) 03/20/2015   Menopause 03/20/2015   Vitamin D deficiency 03/20/2015   Vasovagal syncope 08/18/2014   PSVT (paroxysmal supraventricular tachycardia) (HCC) 04/12/2012   External hemorrhoids 07/15/2007    Past Surgical History:  Procedure Laterality Date   ABDOMINAL HYSTERECTOMY     2013   BREAST BIOPSY Left 2013   core w/clip - neg   COLONOSCOPY WITH PROPOFOL N/A 06/27/2018   Procedure: COLONOSCOPY WITH PROPOFOL;  Surgeon: Toney Reil, MD;  Location: [facility] ENDOSCOPY;  Service: Gastroenterology;  Laterality: N/A;   LAPAROSCOPIC TOTAL HYSTERECTOMY      Family History  Problem Relation Age of Onset   Cancer Mother    Diabetes Mother    Cancer Father    Diabetes Father    Breast cancer Neg Hx     Social History   Tobacco Use   Smoking status: Never   Smokeless tobacco: Never  Substance Use Topics   Alcohol use: No    Alcohol/week: 0.0 standard drinks     Current Outpatient Medications:    acetaminophen (TYLENOL) 500 MG chewable tablet, Chew 500 mg by mouth every 6 (six) hours as needed., Disp: , Rfl:    Cholecalciferol (VITAMIN  D) 2000 UNITS tablet, Take 2,000 Units by mouth daily., Disp: , Rfl:    linaclotide (LINZESS) 72 MCG capsule, Take 1 capsule (72 mcg total) by mouth daily. (Patient taking differently: Take 72 mcg by mouth daily as needed.), Disp: 30 capsule, Rfl: 2   metoprolol tartrate (LOPRESSOR) 25 MG tablet, Take 1 tablet (25 mg total) by mouth 2 (two) times daily., Disp: 180 tablet, Rfl: 1   predniSONE (DELTASONE) 10 MG tablet, Take 1 tablet (10 mg total) by mouth as directed. Day 1 &2 take 6 Day 3&4 take 5 Day 5&6 take 4 Day 7&8 take 3 Day 9& 10 take 2 Day 6 take 1, Disp: 42 tablet, Rfl: 0   triamcinolone (KENALOG) 0.1 %, Apply 1 application  topically 2 (two) times daily. Mix with Eucerin at home and apply on both inner legs, Disp: 80 g, Rfl: 0   ALPRAZolam (XANAX) 0.25 MG tablet, Take 1 tablet (0.25 mg total) by mouth 2 (two) times daily as needed for anxiety., Disp: 20 tablet, Rfl: 0   butalbital-acetaminophen-caffeine (FIORICET WITH CODEINE) 50-325-40-30 MG capsule, Take 1 capsule by mouth every 6 (six) hours as needed for headache., Disp: 30 capsule, Rfl: 0   citalopram (CELEXA) 20 MG tablet, Take 1 tablet (20 mg total) by mouth daily., Disp: 90 tablet, Rfl: 1  No Known Allergies  I personally reviewed active problem list, medication list, allergies, family history, social history, health maintenance with the patient/caregiver today.   ROS  Constitutional: Negative for fever, positive weight change.  Respiratory: Negative for cough and shortness of breath.   Cardiovascular: Negative for chest pain, positive for palpitations.  Gastrointestinal: Negative for abdominal pain, no bowel changes.  Musculoskeletal: Negative for gait problem or joint swelling.  Skin: positive for rash.  Neurological: Negative for dizziness, positive for headache.  No other specific complaints in a complete review of systems (except as listed in HPI above).   Objective  Vitals:   04/06/21 1023  BP: 118/70  Pulse: 60  Resp: 16  Temp: 97.8 F (36.6 C)  SpO2: 97%  Weight: 136 lb (61.7 kg)  Height: 5\' 7"  (1.702 m)    Body mass index is 21.3 kg/m.  Physical Exam  Constitutional: Patient appears well-developed and well-nourished. No distress.  HEENT: head atraumatic, normocephalic, pupils equal and reactive to light, neck supple Cardiovascular: Normal rate, regular rhythm and normal heart sounds.  No murmur heard. No BLE edema. Pulmonary/Chest: Effort normal and breath sounds normal. No respiratory distress. Abdominal: Soft.  There is no tenderness. Skin :red, vesicular rash in clusters with some oozing on to left lower arm, dime sized  rash noted on left side of abdomen.  Psychiatric: Patient has a normal mood and affect. behavior is normal. Judgment and thought content normal.    PHQ2/9: Depression screen [facility] 2/9 04/06/2021 10/06/2020 10/15/2019 02/17/2019 09/13/2018  Decreased Interest 0 0 0 0 0  Down, Depressed, Hopeless 0 0 0 0 0  PHQ - 2 Score 0 0 0 0 0  Altered sleeping 0 0 0 0 0  Tired, decreased energy 1 0 0 1 2  Change in appetite 0 0 0 0 0  Feeling bad or failure about yourself  0 0 0 0 0  Trouble concentrating 0 0 0 0 0  Moving slowly or fidgety/restless 0 0 0 0 0  Suicidal thoughts 0 0 0 0 0  PHQ-9 Score 1 0 0 1 2  Difficult doing work/chores - - - Not difficult at all -  Some recent data might be hidden    phq 9 is negative   Fall Risk: Fall Risk  04/06/2021 10/06/2020 10/15/2019 02/17/2019 09/13/2018  Falls in the past year? 0 0 1 0 0  Comment - - - Larey Seat on Jan 03, 2018 left knee pain -  Number falls in past yr: 0 0 0 0 0  Comment - - - - -  Injury with Fall? 0 0 1 0 0  Comment - - - - -     Functional Status Survey: Is the patient deaf or have difficulty hearing?: No Does the patient have difficulty seeing, even when wearing glasses/contacts?: No Does the patient have difficulty concentrating, remembering, or making decisions?: No Does the patient have difficulty walking or climbing stairs?: No Does the patient have difficulty dressing or bathing?: No Does the patient have difficulty doing errands alone such as visiting a doctor's office or shopping?: No   Assessment & Plan  1. Major depression in remission (HCC)  - citalopram (CELEXA) 20 MG tablet; Take 1 tablet (20 mg total) by mouth daily.  Dispense: 90 tablet; Refill: 1  2. Anxiety, generalized  - ALPRAZolam (XANAX) 0.25 MG tablet; Take 1 tablet (0.25 mg total) by mouth 2 (two) times daily as needed for anxiety.  Dispense: 20 tablet; Refill: 0 - citalopram (CELEXA) 20 MG tablet; Take 1 tablet (20 mg total) by mouth daily.  Dispense: 90  tablet; Refill: 1  3. Chronic tension-type headache, intractable  - butalbital-acetaminophen-caffeine (FIORICET WITH CODEINE) 50-325-40-30 MG capsule; Take 1 capsule by mouth every 6 (six) hours as needed for headache.  Dispense: 30 capsule; Refill: 0  4. PSVT (paroxysmal supraventricular tachycardia) (HCC)  Doing well on beta blocker  5. Gastro-esophageal reflux disease without esophagitis   6. Pre-diabetes  - Hemoglobin A1c  7. Vitamin D deficiency   8. Poison ivy dermatitis  - predniSONE (DELTASONE) 10 MG tablet; Take 1 tablet (10 mg total) by mouth as directed. Day 1 &2 take 6 Day 3&4 take 5 Day 5&6 take 4 Day 7&8 take 3 Day 9& 10 take 2 Day 6 take 1  Dispense: 42 tablet; Refill: 0  9. Long-term use of high-risk medication  - COMPLETE METABOLIC PANEL WITH GFR   I,Analissa Bayless F Meril Dray,acting as a scribe for Ruel Favors, MD.,have documented all relevant documentation on the behalf of Ruel Favors, MD,as directed by  Ruel Favors, MD while in the presence of Ruel Favors, MD.

## 2021-04-06 ENCOUNTER — Ambulatory Visit (INDEPENDENT_AMBULATORY_CARE_PROVIDER_SITE_OTHER): Payer: Self-pay | Admitting: Family Medicine

## 2021-04-06 ENCOUNTER — Encounter: Payer: Self-pay | Admitting: Family Medicine

## 2021-04-06 ENCOUNTER — Other Ambulatory Visit: Payer: Self-pay

## 2021-04-06 VITALS — BP 118/70 | HR 60 | Temp 97.8°F | Resp 16 | Ht 67.0 in | Wt 136.0 lb

## 2021-04-06 DIAGNOSIS — K219 Gastro-esophageal reflux disease without esophagitis: Secondary | ICD-10-CM

## 2021-04-06 DIAGNOSIS — R7303 Prediabetes: Secondary | ICD-10-CM

## 2021-04-06 DIAGNOSIS — F325 Major depressive disorder, single episode, in full remission: Secondary | ICD-10-CM

## 2021-04-06 DIAGNOSIS — G44221 Chronic tension-type headache, intractable: Secondary | ICD-10-CM

## 2021-04-06 DIAGNOSIS — I471 Supraventricular tachycardia: Secondary | ICD-10-CM

## 2021-04-06 DIAGNOSIS — Z79899 Other long term (current) drug therapy: Secondary | ICD-10-CM

## 2021-04-06 DIAGNOSIS — F411 Generalized anxiety disorder: Secondary | ICD-10-CM

## 2021-04-06 DIAGNOSIS — L237 Allergic contact dermatitis due to plants, except food: Secondary | ICD-10-CM

## 2021-04-06 DIAGNOSIS — E559 Vitamin D deficiency, unspecified: Secondary | ICD-10-CM

## 2021-04-06 MED ORDER — BUTALBITAL-APAP-CAFF-COD 50-325-40-30 MG PO CAPS: 1.0000 | ORAL_CAPSULE | Freq: Four times a day (QID) | ORAL | 0 refills | Status: DC | PRN

## 2021-04-06 MED ORDER — CITALOPRAM HYDROBROMIDE 20 MG PO TABS: 20.0000 mg | ORAL_TABLET | Freq: Every day | ORAL | 1 refills | Status: DC

## 2021-04-06 MED ORDER — ALPRAZOLAM 0.25 MG PO TABS: 0.2500 mg | ORAL_TABLET | Freq: Two times a day (BID) | ORAL | 0 refills | Status: DC | PRN

## 2021-04-06 MED ORDER — PREDNISONE 10 MG PO TABS: 10.0000 mg | ORAL_TABLET | ORAL | 0 refills | Status: DC

## 2021-04-07 ENCOUNTER — Telehealth: Payer: Self-pay | Admitting: Family Medicine

## 2021-04-07 DIAGNOSIS — L237 Allergic contact dermatitis due to plants, except food: Secondary | ICD-10-CM

## 2021-04-07 LAB — COMPLETE METABOLIC PANEL WITH GFR
AG Ratio: 1.4 (calc) (ref 1.0–2.5)
ALT: 12 U/L (ref 6–29)
AST: 16 U/L (ref 10–35)
Albumin: 4.3 g/dL (ref 3.6–5.1)
Alkaline phosphatase (APISO): 72 U/L (ref 37–153)
BUN: 16 mg/dL (ref 7–25)
CO2: 29 mmol/L (ref 20–32)
Calcium: 9.9 mg/dL (ref 8.6–10.4)
Chloride: 105 mmol/L (ref 98–110)
Creat: 0.77 mg/dL (ref 0.50–1.03)
Globulin: 3.1 g/dL (calc) (ref 1.9–3.7)
Glucose, Bld: 88 mg/dL (ref 65–99)
Potassium: 4.4 mmol/L (ref 3.5–5.3)
Sodium: 139 mmol/L (ref 135–146)
Total Bilirubin: 0.5 mg/dL (ref 0.2–1.2)
Total Protein: 7.4 g/dL (ref 6.1–8.1)
eGFR: 89 mL/min/{1.73_m2} (ref >=60)

## 2021-04-07 LAB — HEMOGLOBIN A1C
Hgb A1c MFr Bld: 5.7 % of total Hgb — ABNORMAL HIGH (ref <5.7)
Mean Plasma Glucose: 117 mg/dL
eAG (mmol/L): 6.5 mmol/L

## 2021-04-07 NOTE — Telephone Encounter (Signed)
Crystal called from the pharmacy to report that they do not have prednisone in stock

## 2021-04-08 MED ORDER — PREDNISONE 10 MG PO TABS: 10.0000 mg | ORAL_TABLET | ORAL | 0 refills | Status: DC

## 2021-04-08 NOTE — Telephone Encounter (Signed)
Per helen re sent to [facility]

## 2021-09-08 ENCOUNTER — Ambulatory Visit: Payer: Self-pay | Admitting: Family Medicine

## 2021-10-25 ENCOUNTER — Ambulatory Visit (INDEPENDENT_AMBULATORY_CARE_PROVIDER_SITE_OTHER): Payer: 59 | Admitting: Family Medicine

## 2021-10-25 ENCOUNTER — Encounter: Payer: Self-pay | Admitting: Family Medicine

## 2021-10-25 VITALS — BP 126/64 | HR 82 | Resp 16 | Ht 64.0 in | Wt 145.0 lb

## 2021-10-25 DIAGNOSIS — K219 Gastro-esophageal reflux disease without esophagitis: Secondary | ICD-10-CM

## 2021-10-25 DIAGNOSIS — Z1231 Encounter for screening mammogram for malignant neoplasm of breast: Secondary | ICD-10-CM

## 2021-10-25 DIAGNOSIS — F325 Major depressive disorder, single episode, in full remission: Secondary | ICD-10-CM | POA: Diagnosis not present

## 2021-10-25 DIAGNOSIS — I471 Supraventricular tachycardia: Secondary | ICD-10-CM

## 2021-10-25 DIAGNOSIS — E559 Vitamin D deficiency, unspecified: Secondary | ICD-10-CM | POA: Diagnosis not present

## 2021-10-25 DIAGNOSIS — G44221 Chronic tension-type headache, intractable: Secondary | ICD-10-CM

## 2021-10-25 DIAGNOSIS — Z23 Encounter for immunization: Secondary | ICD-10-CM

## 2021-10-25 DIAGNOSIS — F411 Generalized anxiety disorder: Secondary | ICD-10-CM

## 2021-10-25 DIAGNOSIS — R7303 Prediabetes: Secondary | ICD-10-CM

## 2021-10-25 MED ORDER — VITAMIN D (ERGOCALCIFEROL) 1.25 MG (50000 UNIT) PO CAPS: 50000.0000 [IU] | ORAL_CAPSULE | ORAL | 1 refills | Status: DC

## 2021-10-25 MED ORDER — SHINGRIX 50 MCG/0.5ML IM SUSR: 0.5000 mL | Freq: Once | INTRAMUSCULAR | 1 refills | Status: AC

## 2021-10-25 MED ORDER — CITALOPRAM HYDROBROMIDE 20 MG PO TABS: 20.0000 mg | ORAL_TABLET | Freq: Every day | ORAL | 1 refills | Status: DC

## 2021-10-25 MED ORDER — BUTALBITAL-APAP-CAFF-COD 50-325-40-30 MG PO CAPS: 1.0000 | ORAL_CAPSULE | Freq: Four times a day (QID) | ORAL | 0 refills | Status: DC | PRN

## 2021-10-25 NOTE — Patient Instructions (Signed)
Schedule your mammogram ?Get me a copy of flu vaccine ?You can check for shingrix vaccine at [facility] ?

## 2021-10-25 NOTE — Progress Notes (Signed)
Name: Chelsea Decker   MRN: [id number]    DOB: 03/12/1961   Date:10/25/2021 ? ?     Progress Note ? ?Subjective ? ?Chief Complaint ? ?Medication Refill ? ?HPI ? 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 Sleeping well.  ?  ?Tension Headaches: She has episodes of headaches  pain is described as a throbbing or sharp sensation, and radiates to right side of neck, sometimes on left side, at times on temporal area. No photophobia or phonophobia, or focal deficits. She says that the headaches last for about 2-3 hours and having them about once a week.  She takes her Fioricet PRN,, last rx given back in August and just 20 pills, episodes at most twice a week but usually less frequent than that.  ? ?PSVT: she lost insurance last year and lost to follow up with cardiologist She has been taking her lopressor BID prn only. . She states palpitation resolved.  ? ?GERD: She says that her heartburn is well controlled, not currently taking medications .  ? ?Pre-diabetes: last A1C was 5.8 in 11/2017, She denies any polyphagia, polydipsia or polyuria.  She is following a diabetic diet. We will recheck labs yearly last A1C was 5.7 %  ? ?Viatamin D deficiency:  She has been out of vitamin D , she prefers getting a prescription  ? ? ? ?Patient Active Problem List  ? Diagnosis Date Noted  ? Mild major depression ([address]) 09/13/2018  ? Encounter for screening colonoscopy   ? History of shingles 04/17/2017  ? Post-menopause on HRT (hormone replacement therapy) 11/05/2015  ? Fasting hyperglycemia 08/10/2015  ? Anxiety, generalized 03/20/2015  ? Bradycardia 03/20/2015  ? Chronic tension-type headache, intractable 03/20/2015  ? Gastro-esophageal reflux disease without esophagitis 03/20/2015  ? IBS (irritable bowel syndrome) 03/20/2015  ? Menopause 03/20/2015  ? Vitamin D  deficiency 03/20/2015  ? Vasovagal syncope 08/18/2014  ? PSVT (paroxysmal supraventricular tachycardia) ([place]) 04/12/2012  ? External hemorrhoids 07/15/2007  ? ? ?Past Surgical History:  ?Procedure Laterality Date  ? ABDOMINAL HYSTERECTOMY    ? 2013  ? BREAST BIOPSY Left 2013  ? core w/clip - neg  ? COLONOSCOPY WITH PROPOFOL N/A 06/27/2018  ? Procedure: COLONOSCOPY WITH PROPOFOL;  Surgeon: Lin Landsman, MD;  Location: [facility] ENDOSCOPY;  Service: Gastroenterology;  Laterality: N/A;  ? LAPAROSCOPIC TOTAL HYSTERECTOMY    ? ? ?Family History  ?Problem Relation Age of Onset  ? Cancer Mother   ? Diabetes Mother   ? Cancer Father   ? Diabetes Father   ? Breast cancer Neg Hx   ? ? ?Social History  ? ?Tobacco Use  ? Smoking status: Never  ? Smokeless tobacco: Never  ?Substance Use Topics  ? Alcohol use: No  ?  Alcohol/week: 0.0 standard drinks  ? ? ? ?Current Outpatient Medications:  ?  acetaminophen (TYLENOL) 500 MG chewable tablet, Chew 500 mg by mouth every 6 (six) hours as needed., Disp: , Rfl:  ?  ALPRAZolam (XANAX) 0.25 MG tablet, Take 1 tablet (0.25 mg total) by mouth 2 (two) times daily as needed for anxiety., Disp: 20 tablet, Rfl: 0 ?  butalbital-acetaminophen-caffeine (FIORICET WITH CODEINE) 50-325-40-30 MG capsule, Take 1 capsule by mouth every 6 (six) hours as needed for headache., Disp: 30 capsule, Rfl: 0 ?  Cholecalciferol (VITAMIN D) 2000  UNITS tablet, Take 2,000 Units by mouth daily., Disp: , Rfl:  ?  citalopram (CELEXA) 20 MG tablet, Take 1 tablet (20 mg total) by mouth daily., Disp: 90 tablet, Rfl: 1 ?  linaclotide (LINZESS) 72 MCG capsule, Take 1 capsule (72 mcg total) by mouth daily. (Patient taking differently: Take 72 mcg by mouth daily as needed.), Disp: 30 capsule, Rfl: 2 ?  metoprolol tartrate (LOPRESSOR) 25 MG tablet, Take 1 tablet (25 mg total) by mouth 2 (two) times daily., Disp: 180 tablet, Rfl: 1 ?  predniSONE (DELTASONE) 10 MG tablet, Take 1 tablet (10 mg total) by mouth as directed. Day 1  &2 take 6 Day 3&4 take 5 Day 5&6 take 4 Day 7&8 take 3 Day 9& 10 take 2 Day 6 take 1, Disp: 42 tablet, Rfl: 0 ?  triamcinolone (KENALOG) 0.1 %, Apply 1 application topically 2 (two) times daily. Mix with Eucerin at home and apply on both inner legs, Disp: 80 g, Rfl: 0 ? ?No Known Allergies ? ?I personally reviewed active problem list, medication list, allergies, family history, social history, health maintenance with the patient/caregiver today. ? ? ?ROS ? ?Constitutional: Negative for fever or weight change.  ?Respiratory: Negative for cough and shortness of breath.   ?Cardiovascular: Negative for chest pain or palpitations.  ?Gastrointestinal: Negative for abdominal pain, no bowel changes.  ?Musculoskeletal: Negative for gait problem or joint swelling.  ?Skin: Negative for rash.  ?Neurological: Negative for dizziness or headache.  ?No other specific complaints in a complete review of systems (except as listed in HPI above).  ? ?Objective ? ?Vitals:  ? 10/25/21 1434  ?BP: 126/64  ?Pulse: 82  ?Resp: 16  ?SpO2: 98%  ?Weight: 145 lb (65.8 kg)  ?Height: 5\' 4"  (1.626 m)  ? ? ?Body mass index is 24.89 kg/m?. ? ?Physical Exam ? ?Constitutional: Patient appears well-developed and well-nourished.  No distress.  ?HEENT: head atraumatic, normocephalic, pupils equal and reactive to ligh, neck supple ?Cardiovascular: Normal rate, regular rhythm and normal heart sounds.  No murmur heard. No BLE edema. ?Pulmonary/Chest: Effort normal and breath sounds normal. No respiratory distress. ?Abdominal: Soft.  There is no tenderness. ?Psychiatric: Patient has a normal mood and affect. behavior is normal. Judgment and thought content normal.  ? ? ? ?PHQ2/9: ?Depression screen [facility] 2/9 10/25/2021 04/06/2021 10/06/2020 10/15/2019 02/17/2019  ?Decreased Interest 0 0 0 0 0  ?Down, Depressed, Hopeless 0 0 0 0 0  ?PHQ - 2 Score 0 0 0 0 0  ?Altered sleeping 0 0 0 0 0  ?Tired, decreased energy 0 1 0 0 1  ?Change in appetite 0 0 0 0 0  ?Feeling bad or  failure about yourself  0 0 0 0 0  ?Trouble concentrating 0 0 0 0 0  ?Moving slowly or fidgety/restless 0 0 0 0 0  ?Suicidal thoughts 0 0 0 0 0  ?PHQ-9 Score 0 1 0 0 1  ?Difficult doing work/chores - - - - Not difficult at all  ?Some recent data might be hidden  ?  ?phq 9 is negative ? ? ?Fall Risk: ?Fall Risk  10/25/2021 04/06/2021 10/06/2020 10/15/2019 02/17/2019  ?Falls in the past year? 0 0 0 1 0  ?Comment - - [place] on Jan 03, 2018 left knee pain  ?Number falls in past yr: 0 0 0 0 0  ?Comment - - - - -  ?Injury with Fall? 0 0 0 1 0  ?Comment - - - - -  ?Risk  for fall due to : No Fall Risks - - - -  ?Follow up Falls prevention discussed - - - -  ? ? ? ? ?Functional Status Survey: ?Is the patient deaf or have difficulty hearing?: No ?Does the patient have difficulty seeing, even when wearing glasses/contacts?: No ?Does the patient have difficulty concentrating, remembering, or making decisions?: No ?Does the patient have difficulty walking or climbing stairs?: No ?Does the patient have difficulty dressing or bathing?: No ?Does the patient have difficulty doing errands alone such as visiting a doctor's office or shopping?: No ? ? ? ?Assessment & Plan ? ?1. Chronic tension-type headache, intractable ? ?- butalbital-acetaminophen-caffeine (FIORICET WITH CODEINE) 50-325-40-30 MG capsule; Take 1 capsule by mouth every 6 (six) hours as needed for headache.  Dispense: 30 capsule; Refill: 0 ? ?2. Major depression in remission [facility]) ? ?- citalopram (CELEXA) 20 MG tablet; Take 1 tablet (20 mg total) by mouth daily.  Dispense: 90 tablet; Refill: 1 ? ?3. Vitamin D deficiency ? ?- Vitamin D, Ergocalciferol, (DRISDOL) 1.25 MG (50000 UNIT) CAPS capsule; Take 1 capsule (50,000 Units total) by mouth every 7 (seven) days.  Dispense: 12 capsule; Refill: 1 ? ?4. Gastro-esophageal reflux disease without esophagitis ? ? ?5. Anxiety, generalized ? ?- citalopram (CELEXA) 20 MG tablet; Take 1 tablet (20 mg total) by mouth daily.  Dispense: 90  tablet; Refill: 1 ? ?6. PSVT (paroxysmal supraventricular tachycardia) ([place]) ? ? ?7. Pre-diabetes ? ? ?8. Need for shingles vaccine ? ?- Zoster Vaccine Adjuvanted [facility]) injection; Inject 0.5 mLs i

## 2021-11-15 ENCOUNTER — Ambulatory Visit: Payer: Self-pay | Admitting: Family Medicine

## 2022-02-01 ENCOUNTER — Ambulatory Visit
Admission: RE | Admit: 2022-02-01 | Discharge: 2022-02-01 | Disposition: A | Discharge status: Home / Self Care | Payer: BC Managed Care – PPO | Source: Ambulatory Visit | Attending: Family Medicine | Admitting: Family Medicine

## 2022-02-01 DIAGNOSIS — Z1231 Encounter for screening mammogram for malignant neoplasm of breast: Secondary | ICD-10-CM | POA: Insufficient documentation

## 2022-02-28 NOTE — Progress Notes (Unsigned)
Name: Chelsea Decker   MRN: [id number]    DOB: 1961-06-21   Date:03/01/2022       Progress Note  Subjective  Chief Complaint  Annual Exam  HPI  Patient presents for annual CPE.  Diet: balanced Exercise: she has a physical job, continue regular physical activity   [place] Office Visit from 10/06/2020 in [facility]  AUDIT-C Score 0      Depression: Phq 9 is  negative    03/01/2022    3:20 PM 10/25/2021    2:33 PM 04/06/2021   10:23 AM 10/06/2020   11:25 AM 10/15/2019    9:42 AM  Depression screen PHQ 2/9  Decreased Interest 0 0 0 0 0  Down, Depressed, Hopeless 0 0 0 0 0  PHQ - 2 Score 0 0 0 0 0  Altered sleeping 0 0 0 0 0  Tired, decreased energy 0 0 1 0 0  Change in appetite 0 0 0 0 0  Feeling bad or failure about yourself  0 0 0 0 0  Trouble concentrating 0 0 0 0 0  Moving slowly or fidgety/restless 0 0 0 0 0  Suicidal thoughts 0 0 0 0 0  PHQ-9 Score 0 0 1 0 0   Hypertension: BP Readings from Last 3 Encounters:  03/01/22 120/68  10/25/21 126/64  04/06/21 118/70   Obesity: Wt Readings from Last 3 Encounters:  03/01/22 148 lb 4.8 oz (67.3 kg)  10/25/21 145 lb (65.8 kg)  04/06/21 136 lb (61.7 kg)   BMI Readings from Last 3 Encounters:  03/01/22 25.46 kg/m  10/25/21 24.89 kg/m  04/06/21 21.30 kg/m     Vaccines:    Tdap: up to date Shingrix: discussed importance to check coverage  Pneumonia: we will wait for pcv 20 at age 61  Flu: up to date COVID-30: discussed booster    Hep C Screening: 04/29/12 STD testing and prevention (HIV/chl/gon/syphilis): 02/20/17 Intimate partner violence: negative screen  Sexual History : no pain , same partner  Menstrual History/LMP/Abnormal Bleeding: post-menopausal  Discussed importance of follow up if any post-menopausal bleeding: yes  Incontinence Symptoms: negative for symptoms   Breast cancer:  - Last Mammogram: 02/01/22 - BRCA gene screening: N/A  Osteoporosis Prevention : Discussed  high calcium and vitamin D supplementation, weight bearing exercises Bone density: 05/23/12   Cervical cancer screening: N/A  Skin cancer: Discussed monitoring for atypical lesions  Colorectal cancer: 06/27/18  repeat in 2024  Lung cancer:  Low Dose CT Chest recommended if Age 64-80 years, 20 pack-year currently smoking OR have quit w/in 15years. Patient does not qualify for screen   ECG: 09/04/17  Advanced Care Planning: A voluntary discussion about advance care planning including the explanation and discussion of advance directives.  Discussed health care proxy and Living will, and the patient was able to identify a health care proxy as daughter - Rodena Piety and sister Hollie Salk  .  Patient does not have a living will and power of attorney of health care   Lipids: Lab Results  Component Value Date   CHOL 196 12/10/2017   CHOL 189 11/27/2016   CHOL 231 (H) 11/05/2015   Lab Results  Component Value Date   HDL 67 12/10/2017   HDL 65 11/27/2016   HDL 80 11/05/2015   Lab Results  Component Value Date   LDLCALC 112 (H) 12/10/2017   LDLCALC 114 (H) 11/27/2016   LDLCALC 141 (H) 11/05/2015   Lab Results  Component Value  Date   TRIG 84 12/10/2017   TRIG 49 11/27/2016   TRIG 50 11/05/2015   Lab Results  Component Value Date   CHOLHDL 2.9 12/10/2017   CHOLHDL 2.9 11/27/2016   CHOLHDL 2.9 11/05/2015   No results found for: "LDLDIRECT"  Glucose: Glucose  Date Value Ref Range Status  11/26/2014 97 mg/dL Final    Comment:    65-99 NOTE: New Reference Range  10/20/14   03/29/2014 101 (H) 65 - 99 mg/dL Final  01/05/2012 78 65 - 99 mg/dL Final   Glucose, Bld  Date Value Ref Range Status  04/06/2021 88 65 - 99 mg/dL Final    Comment:    .            Fasting reference interval .   12/10/2017 82 65 - 139 mg/dL Final    Comment:    .        Non-fasting reference interval .   02/20/2017 83 65 - 99 mg/dL Final    Patient Active Problem List   Diagnosis Date Noted    Mild major depression ([place]) 09/13/2018   Encounter for screening colonoscopy    History of shingles 04/17/2017   Post-menopause on HRT (hormone replacement therapy) 11/05/2015   Fasting hyperglycemia 08/10/2015   Anxiety, generalized 03/20/2015   Bradycardia 03/20/2015   Chronic tension-type headache, intractable 03/20/2015   Gastro-esophageal reflux disease without esophagitis 03/20/2015   IBS (irritable bowel syndrome) 03/20/2015   Menopause 03/20/2015   Vitamin D deficiency 03/20/2015   Vasovagal syncope 08/18/2014   PSVT (paroxysmal supraventricular tachycardia) ([place]) 04/12/2012   External hemorrhoids 07/15/2007    Past Surgical History:  Procedure Laterality Date   ABDOMINAL HYSTERECTOMY     2013   BREAST BIOPSY Left 2013   core w/clip - neg   COLONOSCOPY WITH PROPOFOL N/A 06/27/2018   Procedure: COLONOSCOPY WITH PROPOFOL;  Surgeon: Lin Landsman, MD;  Location: [place];  Service: Gastroenterology;  Laterality: N/A;   LAPAROSCOPIC TOTAL HYSTERECTOMY      Family History  Problem Relation Age of Onset   Cancer Mother    Diabetes Mother    Cancer Father    Diabetes Father    Breast cancer Neg Hx     Social History   Socioeconomic History   Marital status: Divorced    Spouse name: Not on file   Number of children: 2   Years of education: Not on file   Highest education level: High school graduate  Occupational History   Occupation: house keeping   Tobacco Use   Smoking status: Never   Smokeless tobacco: Never  Vaping Use   Vaping Use: Never used  Substance and Sexual Activity   Alcohol use: No    Alcohol/week: 0.0 standard drinks of alcohol   Drug use: No   Sexual activity: Yes    Partners: Male  Other Topics Concern   Not on file  Social History Narrative   Divorced   Dating Jeneen Rinks since 2015   Lost her job at [employer] and is now working at a nursing home at [employer]    Her youngest child - son, has disability since birth,  MR    Social Determinants of Health   Financial Resource Strain: [place]  (03/01/2022)   Overall Financial Resource Strain (CARDIA)    Difficulty of Paying Living Expenses: Not hard at all  [place]: No [place] (03/01/2022)   Hunger Vital Sign    Worried About Running Out  of Food in the Last Year: Never true    [place] in the Last Year: Never true  Transportation Needs: No Transportation Needs (03/01/2022)   PRAPARE - Hydrologist (Medical): No    Lack of Transportation (Non-Medical): No  Physical Activity: Sufficiently Active (03/01/2022)   Exercise Vital Sign    Days of Exercise per Week: 4 days    Minutes of Exercise per Session: 60 min  Recent Concern: Physical Activity - Insufficiently Active (03/01/2022)   Exercise Vital Sign    Days of Exercise per Week: 1 day    Minutes of Exercise per Session: 30 min  Stress: No Stress Concern Present (03/01/2022)   [place]    Feeling of Stress : Not at all  Social Connections: Moderately Isolated (03/01/2022)   Social Connection and Isolation Panel [NHANES]    Frequency of Communication with Friends and Family: Once a week    Frequency of Social Gatherings with Friends and Family: Once a week    Attends Religious Services: More than 4 times per year    Active Member of [employer] or Organizations: No    Attends Music therapist: More than 4 times per year    Marital Status: Divorced  Human resources officer Violence: Not At Risk (03/01/2022)   Humiliation, Afraid, Rape, and Kick questionnaire    Fear of Current or Ex-Partner: No    Emotionally Abused: No    Physically Abused: No    Sexually Abused: No     Current Outpatient Medications:    acetaminophen (TYLENOL) 500 MG chewable tablet, Chew 500 mg by mouth every 6 (six) hours as needed., Disp: , Rfl:    ALPRAZolam (XANAX) 0.25 MG tablet, Take 1 tablet (0.25 mg  total) by mouth 2 (two) times daily as needed for anxiety., Disp: 20 tablet, Rfl: 0   butalbital-acetaminophen-caffeine (FIORICET WITH CODEINE) 50-325-40-30 MG capsule, Take 1 capsule by mouth every 6 (six) hours as needed for headache., Disp: 30 capsule, Rfl: 0   Cholecalciferol (VITAMIN D) 2000 UNITS tablet, Take 2,000 Units by mouth daily., Disp: , Rfl:    citalopram (CELEXA) 20 MG tablet, Take 1 tablet (20 mg total) by mouth daily., Disp: 90 tablet, Rfl: 1   linaclotide (LINZESS) 72 MCG capsule, Take 1 capsule (72 mcg total) by mouth daily. (Patient taking differently: Take 72 mcg by mouth daily as needed.), Disp: 30 capsule, Rfl: 2   metoprolol tartrate (LOPRESSOR) 25 MG tablet, Take 1 tablet (25 mg total) by mouth 2 (two) times daily., Disp: 180 tablet, Rfl: 1   triamcinolone (KENALOG) 0.1 %, Apply 1 application topically 2 (two) times daily. Mix with Eucerin at home and apply on both inner legs, Disp: 80 g, Rfl: 0   Vitamin D, Ergocalciferol, (DRISDOL) 1.25 MG (50000 UNIT) CAPS capsule, Take 1 capsule (50,000 Units total) by mouth every 7 (seven) days. (Patient not taking: Reported on 03/01/2022), Disp: 12 capsule, Rfl: 1  No Known Allergies   ROS  Constitutional: Negative for fever or weight change.  Respiratory: Negative for cough and shortness of breath.   Cardiovascular: Negative for chest pain or palpitations.  Gastrointestinal: Negative for abdominal pain, no bowel changes.  Musculoskeletal: Negative for gait problem or joint swelling.  Skin: Negative for rash.  Neurological: Negative for dizziness or headache.  No other specific complaints in a complete review of systems (except as listed in HPI above).   Objective  Vitals:   03/01/22 1514  BP: 120/68  Pulse: 82  Resp: 16  Temp: 97.8 F (36.6 C)  TempSrc: Oral  SpO2: 97%  Weight: 148 lb 4.8 oz (67.3 kg)  Height: _0  (1.626 m)    Body mass index is 25.46 kg/m.  Physical Exam  Constitutional: Patient appears  well-developed and well-nourished. No distress.  HENT: Head: Normocephalic and atraumatic. Ears: B TMs ok, no erythema or effusion; Nose: Nose normal. Mouth/Throat: Oropharynx is clear and moist. No oropharyngeal exudate.  Eyes: Conjunctivae and EOM are normal. Pupils are equal, round, and reactive to light. No scleral icterus.  Neck: Normal range of motion. Neck supple. No JVD present. No thyromegaly present.  Cardiovascular: Normal rate, regular rhythm and normal heart sounds.  No murmur heard. No BLE edema. Pulmonary/Chest: Effort normal and breath sounds normal. No respiratory distress. Abdominal: Soft. Bowel sounds are normal, no distension. There is no tenderness. no masses Breast: no lumps or masses, no nipple discharge or rashes FEMALE GENITALIA:  Normal external genitalia, cervix absent, normal bimanual exam  RECTAL: not done  Musculoskeletal: Normal range of motion, no joint effusions. No gross deformities Neurological: he is alert and oriented to person, place, and time. No cranial nerve deficit. Coordination, balance, strength, speech and gait are normal.  Skin: Skin is warm and dry. No rash noted. No erythema.  Psychiatric: Patient has a normal mood and affect. behavior is normal. Judgment and thought content normal.   Fall Risk:    03/01/2022    3:21 PM 10/25/2021    2:33 PM 04/06/2021   10:23 AM 10/06/2020   11:11 AM 10/15/2019    9:41 AM  Fall Risk   Falls in the past year? 0 0 0 0 1  Number falls in past yr:  0 0 0 0  Injury with Fall?  0 0 0 1  Risk for fall due to : No Fall Risks No Fall Risks     Follow up Falls prevention discussed;Education provided;Falls evaluation completed Falls prevention discussed        Functional Status Survey: Is the patient deaf or have difficulty hearing?: No Does the patient have difficulty seeing, even when wearing glasses/contacts?: No Does the patient have difficulty concentrating, remembering, or making decisions?: No Does the  patient have difficulty walking or climbing stairs?: No Does the patient have difficulty dressing or bathing?: No Does the patient have difficulty doing errands alone such as visiting a doctor's office or shopping?: No   Assessment & Plan  1. Well adult exam  - Lipid panel - CBC with Differential/Platelet - COMPLETE METABOLIC PANEL WITH GFR - VITAMIN D 25 Hydroxy (Vit-D Deficiency, Fractures) - Hemoglobin A1c  2. Vitamin D deficiency  - VITAMIN D 25 Hydroxy (Vit-D Deficiency, Fractures)  3. Pre-diabetes  - Hemoglobin A1c  4. Long-term use of high-risk medication  - CBC with Differential/Platelet - COMPLETE METABOLIC PANEL WITH GFR  5. Lipid screening  - Lipid panel   -USPSTF grade A and B recommendations reviewed with patient; age-appropriate recommendations, preventive care, screening tests, etc discussed and encouraged; healthy living encouraged; see AVS for patient education given to patient -Discussed importance of 150 minutes of physical activity weekly, eat two servings of fish weekly, eat one serving of tree nuts ( cashews, pistachios, pecans, almonds.Marland Kitchen) every other day, eat 6 servings of fruit/vegetables daily and drink plenty of water and avoid sweet beverages.   -Reviewed Health Maintenance: Yes.

## 2022-02-28 NOTE — Patient Instructions (Signed)
Preventive Care 61-61 Years Old, Female Preventive care refers to lifestyle choices and visits with your health care provider that can promote health and wellness. Preventive care visits are also called wellness exams. What can I expect for my preventive care visit? Counseling Your health care provider may ask you questions about your: Medical history, including: Past medical problems. Family medical history. Pregnancy history. Current health, including: Menstrual cycle. Method of birth control. Emotional well-being. Home life and relationship well-being. Sexual activity and sexual health. Lifestyle, including: Alcohol, nicotine or tobacco, and drug use. Access to firearms. Diet, exercise, and sleep habits. Work and work environment. Sunscreen use. Safety issues such as seatbelt and bike helmet use. Physical exam Your health care provider will check your: Height and weight. These may be used to calculate your BMI (body mass index). BMI is a measurement that tells if you are at a healthy weight. Waist circumference. This measures the distance around your waistline. This measurement also tells if you are at a healthy weight and may help predict your risk of certain diseases, such as type 2 diabetes and high blood pressure. Heart rate and blood pressure. Body temperature. Skin for abnormal spots. What immunizations do I need?  Vaccines are usually given at various ages, according to a schedule. Your health care provider will recommend vaccines for you based on your age, medical history, and lifestyle or other factors, such as travel or where you work. What tests do I need? Screening Your health care provider may recommend screening tests for certain conditions. This may include: Lipid and cholesterol levels. Diabetes screening. This is done by checking your blood sugar (glucose) after you have not eaten for a while (fasting). Pelvic exam and Pap test. Hepatitis B test. Hepatitis C  test. HIV (human immunodeficiency virus) test. STI (sexually transmitted infection) testing, if you are at risk. Lung cancer screening. Colorectal cancer screening. Mammogram. Talk with your health care provider about when you should start having regular mammograms. This may depend on whether you have a family history of breast cancer. BRCA-related cancer screening. This may be done if you have a family history of breast, ovarian, tubal, or peritoneal cancers. Bone density scan. This is done to screen for osteoporosis. Talk with your health care provider about your test results, treatment options, and if necessary, the need for more tests. Follow these instructions at home: Eating and drinking  Eat a diet that includes fresh fruits and vegetables, whole grains, lean protein, and low-fat dairy products. Take vitamin and mineral supplements as recommended by your health care provider. Do not drink alcohol if: Your health care provider tells you not to drink. You are pregnant, may be pregnant, or are planning to become pregnant. If you drink alcohol: Limit how much you have to 0-1 drink a day. Know how much alcohol is in your drink. In the [place] one drink equals one 12 oz bottle of beer (355 mL), one 5 oz glass of wine (148 mL), or one 1 oz glass of hard liquor (44 mL). Lifestyle Brush your teeth every morning and night with fluoride toothpaste. Floss one time each day. Exercise for at least 30 minutes 5 or more days each week. Do not use any products that contain nicotine or tobacco. These products include cigarettes, chewing tobacco, and vaping devices, such as e-cigarettes. If you need help quitting, ask your health care provider. Do not use drugs. If you are sexually active, practice safe sex. Use a condom or other form of protection to   prevent STIs. If you do not wish to become pregnant, use a form of birth control. If you plan to become pregnant, see your health care provider for a  prepregnancy visit. Take aspirin only as told by your health care provider. Make sure that you understand how much to take and what form to take. Work with your health care provider to find out whether it is safe and beneficial for you to take aspirin daily. Find healthy ways to manage stress, such as: Meditation, yoga, or listening to music. Journaling. Talking to a trusted person. Spending time with friends and family. Minimize exposure to UV radiation to reduce your risk of skin cancer. Safety Always wear your seat belt while driving or riding in a vehicle. Do not drive: If you have been drinking alcohol. Do not ride with someone who has been drinking. When you are tired or distracted. While texting. If you have been using any mind-altering substances or drugs. Wear a helmet and other protective equipment during sports activities. If you have firearms in your house, make sure you follow all gun safety procedures. Seek help if you have been physically or sexually abused. What's next? Visit your health care provider once a year for an annual wellness visit. Ask your health care provider how often you should have your eyes and teeth checked. Stay up to date on all vaccines. This information is not intended to replace advice given to you by your health care provider. Make sure you discuss any questions you have with your health care provider. Document Revised: 01/26/2021 Document Reviewed: 01/26/2021 Elsevier Patient Education  [place].

## 2022-03-01 ENCOUNTER — Encounter: Payer: Self-pay | Admitting: Family Medicine

## 2022-03-01 ENCOUNTER — Ambulatory Visit (INDEPENDENT_AMBULATORY_CARE_PROVIDER_SITE_OTHER): Payer: BC Managed Care – PPO | Admitting: Family Medicine

## 2022-03-01 VITALS — BP 120/68 | HR 82 | Temp 97.8°F | Resp 16 | Ht 64.0 in | Wt 148.3 lb

## 2022-03-01 DIAGNOSIS — E559 Vitamin D deficiency, unspecified: Secondary | ICD-10-CM

## 2022-03-01 DIAGNOSIS — Z0000 Encounter for general adult medical examination without abnormal findings: Secondary | ICD-10-CM | POA: Diagnosis not present

## 2022-03-01 DIAGNOSIS — Z79899 Other long term (current) drug therapy: Secondary | ICD-10-CM | POA: Diagnosis not present

## 2022-03-01 DIAGNOSIS — Z13220 Encounter for screening for lipoid disorders: Secondary | ICD-10-CM

## 2022-03-01 DIAGNOSIS — R7303 Prediabetes: Secondary | ICD-10-CM | POA: Diagnosis not present

## 2022-03-02 LAB — LIPID PANEL
Cholesterol: 214 mg/dL — ABNORMAL HIGH (ref <200)
HDL: 60 mg/dL (ref >=50)
LDL Cholesterol (Calc): 135 mg/dL (calc) — ABNORMAL HIGH
Non-HDL Cholesterol (Calc): 154 mg/dL (calc) — ABNORMAL HIGH (ref <130)
Total CHOL/HDL Ratio: 3.6 (calc) (ref <5.0)
Triglycerides: 89 mg/dL (ref <150)

## 2022-03-02 LAB — CBC WITH DIFFERENTIAL/PLATELET
Absolute Monocytes: 638 cells/uL (ref 200–950)
Basophils Absolute: 45 cells/uL (ref 0–200)
Basophils Relative: 0.4 %
Eosinophils Absolute: 168 cells/uL (ref 15–500)
Eosinophils Relative: 1.5 %
HCT: 38.4 % (ref 35.0–45.0)
Hemoglobin: 12.8 g/dL (ref 11.7–15.5)
Lymphs Abs: 2666 cells/uL (ref 850–3900)
MCH: 30.3 pg (ref 27.0–33.0)
MCHC: 33.3 g/dL (ref 32.0–36.0)
MCV: 90.8 fL (ref 80.0–100.0)
MPV: 12.3 fL (ref 7.5–12.5)
Monocytes Relative: 5.7 %
Neutro Abs: 7683 cells/uL (ref 1500–7800)
Neutrophils Relative %: 68.6 %
Platelets: 208 10*3/uL (ref 140–400)
RBC: 4.23 10*6/uL (ref 3.80–5.10)
RDW: 13.7 % (ref 11.0–15.0)
Total Lymphocyte: 23.8 %
WBC: 11.2 10*3/uL — ABNORMAL HIGH (ref 3.8–10.8)

## 2022-03-02 LAB — COMPLETE METABOLIC PANEL WITH GFR
AG Ratio: 1.3 (calc) (ref 1.0–2.5)
ALT: 12 U/L (ref 6–29)
AST: 15 U/L (ref 10–35)
Albumin: 4.3 g/dL (ref 3.6–5.1)
Alkaline phosphatase (APISO): 86 U/L (ref 37–153)
BUN: 14 mg/dL (ref 7–25)
CO2: 25 mmol/L (ref 20–32)
Calcium: 9.4 mg/dL (ref 8.6–10.4)
Chloride: 107 mmol/L (ref 98–110)
Creat: 0.73 mg/dL (ref 0.50–1.05)
Globulin: 3.2 g/dL (calc) (ref 1.9–3.7)
Glucose, Bld: 80 mg/dL (ref 65–99)
Potassium: 3.9 mmol/L (ref 3.5–5.3)
Sodium: 140 mmol/L (ref 135–146)
Total Bilirubin: 0.3 mg/dL (ref 0.2–1.2)
Total Protein: 7.5 g/dL (ref 6.1–8.1)
eGFR: 94 mL/min/{1.73_m2} (ref >=60)

## 2022-03-02 LAB — HEMOGLOBIN A1C
Hgb A1c MFr Bld: 5.8 % of total Hgb — ABNORMAL HIGH (ref <5.7)
Mean Plasma Glucose: 120 mg/dL
eAG (mmol/L): 6.6 mmol/L

## 2022-03-02 LAB — VITAMIN D 25 HYDROXY (VIT D DEFICIENCY, FRACTURES): Vit D, 25-Hydroxy: 46 ng/mL (ref 30–100)

## 2022-04-03 ENCOUNTER — Other Ambulatory Visit: Payer: Self-pay | Admitting: Family Medicine

## 2022-04-03 DIAGNOSIS — E559 Vitamin D deficiency, unspecified: Secondary | ICD-10-CM

## 2022-04-13 ENCOUNTER — Other Ambulatory Visit: Payer: Self-pay | Admitting: Family Medicine

## 2022-04-13 DIAGNOSIS — F411 Generalized anxiety disorder: Secondary | ICD-10-CM

## 2022-04-13 DIAGNOSIS — F325 Major depressive disorder, single episode, in full remission: Secondary | ICD-10-CM

## 2022-04-25 NOTE — Progress Notes (Unsigned)
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 No photophobia or phonophobia, or focal deficits. She says that the headaches last for about 2-3 hours and having them about once a week.  She takes her Fioricet PRN,, she would like a refill today   PSVT: she lost insurance last year and lost to follow up with cardiologist She has been taking her lopressor BID prn only. . She states palpitation resolved.   GERD: doing well for a while now not currently taking medications .   Pre-diabetes: A1C has been stable at 5.8 % .  She denies any polyphagia, polydipsia or polyuria.  She is following a diabetic diet.  Viatamin D deficiency:  she has been taking supplements and last level was at goal   Dyslipidemia: discussed diet and life style modifications   The 10-year ASCVD risk score (Arnett DK, et al., 2019) is: 4.2%   Values used to calculate the score:     Age: 10 years     Sex: Female     Is Non-Hispanic African American: Yes     Diabetic: No     Tobacco smoker: No     Systolic Blood Pressure: 188 mmHg     Is BP treated: No     HDL Cholesterol: 60 mg/dL     Total Cholesterol: 214 mg/dL   Patient Active Problem List   Diagnosis Date Noted   Mild major depression ([place]) 09/13/2018   Encounter for screening colonoscopy    History of shingles 04/17/2017   Post-menopause on HRT (hormone replacement therapy) 11/05/2015   Fasting hyperglycemia 08/10/2015   Anxiety, generalized 03/20/2015    Bradycardia 03/20/2015   Chronic tension-type headache, intractable 03/20/2015   Gastro-esophageal reflux disease without esophagitis 03/20/2015   IBS (irritable bowel syndrome) 03/20/2015   Menopause 03/20/2015   Vitamin D deficiency 03/20/2015   Vasovagal syncope 08/18/2014   PSVT (paroxysmal supraventricular tachycardia) ([place]) 04/12/2012   External hemorrhoids 07/15/2007    Past Surgical History:  Procedure Laterality Date   ABDOMINAL HYSTERECTOMY     2013   BREAST BIOPSY Left 2013   core w/clip - neg   COLONOSCOPY WITH PROPOFOL N/A 06/27/2018   Procedure: COLONOSCOPY WITH PROPOFOL;  Surgeon: Lin Landsman, MD;  Location: [facility] ENDOSCOPY;  Service: Gastroenterology;  Laterality: N/A;   LAPAROSCOPIC TOTAL HYSTERECTOMY      Family History  Problem Relation Age of Onset   Cancer Mother    Diabetes Mother    Cancer Father    Diabetes Father    Breast cancer Neg Hx     Social History   Tobacco Use   Smoking status: Never   Smokeless tobacco: Never  Substance Use Topics   Alcohol use: No    Alcohol/week: 0.0 standard drinks of alcohol     Current Outpatient Medications:    acetaminophen (TYLENOL) 500 MG chewable tablet, Chew 500 mg by mouth every  6 (six) hours as needed., Disp: , Rfl:    ALPRAZolam (XANAX) 0.25 MG tablet, Take 1 tablet (0.25 mg total) by mouth 2 (two) times daily as needed for anxiety., Disp: 20 tablet, Rfl: 0   butalbital-acetaminophen-caffeine (FIORICET WITH CODEINE) 50-325-40-30 MG capsule, Take 1 capsule by mouth every 6 (six) hours as needed for headache., Disp: 30 capsule, Rfl: 0   Cholecalciferol (VITAMIN D) 2000 UNITS tablet, Take 2,000 Units by mouth daily., Disp: , Rfl:    citalopram (CELEXA) 20 MG tablet, Take 1 tablet by mouth once daily, Disp: 90 tablet, Rfl: 0   linaclotide (LINZESS) 72 MCG capsule, Take 1 capsule (72 mcg total) by mouth daily. (Patient taking differently: Take 72 mcg by mouth daily as needed.), Disp: 30 capsule, Rfl:  2   metoprolol tartrate (LOPRESSOR) 25 MG tablet, Take 1 tablet (25 mg total) by mouth 2 (two) times daily., Disp: 180 tablet, Rfl: 1   triamcinolone (KENALOG) 0.1 %, Apply 1 application topically 2 (two) times daily. Mix with Eucerin at home and apply on both inner legs, Disp: 80 g, Rfl: 0   Vitamin D, Ergocalciferol, (DRISDOL) 1.25 MG (50000 UNIT) CAPS capsule, Take 1 capsule by mouth once a week, Disp: 12 capsule, Rfl: 0  No Known Allergies  I personally reviewed active problem list, medication list, allergies, family history, social history, health maintenance with the patient/caregiver today.   ROS  Constitutional: Negative for fever or weight change.  Respiratory: Negative for cough and shortness of breath.   Cardiovascular: Negative for chest pain or palpitations.  Gastrointestinal: Negative for abdominal pain, no bowel changes.  Musculoskeletal: Negative for gait problem or joint swelling.  Skin: Negative for rash.  Neurological: Negative for dizziness or headache.  No other specific complaints in a complete review of systems (except as listed in HPI above).   Objective  Vitals:   04/26/22 1440  BP: 118/70  Pulse: 72  Resp: 14  Temp: 98.3 F (36.8 C)  TempSrc: Oral  SpO2: 95%  Weight: 149 lb (67.6 kg)  Height: 5' 4"  (1.626 m)    Body mass index is 25.58 kg/m.  Physical Exam  Constitutional: Patient appears well-developed and well-nourished.  No distress.  HEENT: head atraumatic, normocephalic, pupils equal and reactive to light, neck supple Cardiovascular: Normal rate, regular rhythm and normal heart sounds.  No murmur heard. No BLE edema. Pulmonary/Chest: Effort normal and breath sounds normal. No respiratory distress. Abdominal: Soft.  There is no tenderness. Psychiatric: Patient has a normal mood and affect. behavior is normal. Judgment and thought content normal.   Recent Results (from the past 2160 hour(s))  Lipid panel     Status: Abnormal   Collection  Time: 03/01/22  3:54 PM  Result Value Ref Range   Cholesterol 214 (H) <200 mg/dL   HDL 60 > OR = 50 mg/dL   Triglycerides 89 <150 mg/dL   LDL Cholesterol (Calc) 135 (H) mg/dL (calc)    Comment: Reference range: <100 . Desirable range <100 mg/dL for primary prevention;   <70 mg/dL for patients with CHD or diabetic patients  with > or = 2 CHD risk factors. Marland Kitchen LDL-C is now calculated using the Martin-Hopkins  calculation, which is a validated novel method providing  better accuracy than the Friedewald equation in the  estimation of LDL-C.  Cresenciano Genre et al. Annamaria Helling. 8756;433(29): 2061-2068  (http://education.[facility].com/faq/FAQ164)    Total CHOL/HDL Ratio 3.6 <5.0 (calc)   Non-HDL Cholesterol (Calc) 154 (H) <130 mg/dL (calc)  Comment: For patients with diabetes plus 1 major ASCVD risk  factor, treating to a non-HDL-C goal of <100 mg/dL  (LDL-C of <70 mg/dL) is considered a therapeutic  option.   CBC with Differential/Platelet     Status: Abnormal   Collection Time: 03/01/22  3:54 PM  Result Value Ref Range   WBC 11.2 (H) 3.8 - 10.8 Thousand/uL   RBC 4.23 3.80 - 5.10 Million/uL   Hemoglobin 12.8 11.7 - 15.5 g/dL   HCT 38.4 35.0 - 45.0 %   MCV 90.8 80.0 - 100.0 fL   MCH 30.3 27.0 - 33.0 pg   MCHC 33.3 32.0 - 36.0 g/dL   RDW 13.7 11.0 - 15.0 %   Platelets 208 140 - 400 Thousand/uL   MPV 12.3 7.5 - 12.5 fL   Neutro Abs 7,683 1,500 - 7,800 cells/uL   Lymphs Abs 2,666 850 - 3,900 cells/uL   Absolute Monocytes 638 200 - 950 cells/uL   Eosinophils Absolute 168 15 - 500 cells/uL   Basophils Absolute 45 0 - 200 cells/uL   Neutrophils Relative % 68.6 %   Total Lymphocyte 23.8 %   Monocytes Relative 5.7 %   Eosinophils Relative 1.5 %   Basophils Relative 0.4 %  COMPLETE METABOLIC PANEL WITH GFR     Status: None   Collection Time: 03/01/22  3:54 PM  Result Value Ref Range   Glucose, Bld 80 65 - 99 mg/dL    Comment: .            Fasting reference interval .    BUN 14 7 -  25 mg/dL   Creat 0.73 0.50 - 1.05 mg/dL   eGFR 94 > OR = 60 mL/min/1.36m    Comment: The eGFR is based on the CKD-EPI 2021 equation. To calculate  the new eGFR from a previous Creatinine or Cystatin C result, go to https://www.kidney.org/professionals/ kdoqi/gfr%5Fcalculator    BUN/Creatinine Ratio NOT APPLICABLE 6 - 22 (calc)   Sodium 140 135 - 146 mmol/L   Potassium 3.9 3.5 - 5.3 mmol/L   Chloride 107 98 - 110 mmol/L   CO2 25 20 - 32 mmol/L   Calcium 9.4 8.6 - 10.4 mg/dL   Total Protein 7.5 6.1 - 8.1 g/dL   Albumin 4.3 3.6 - 5.1 g/dL   Globulin 3.2 1.9 - 3.7 g/dL (calc)   AG Ratio 1.3 1.0 - 2.5 (calc)   Total Bilirubin 0.3 0.2 - 1.2 mg/dL   Alkaline phosphatase (APISO) 86 37 - 153 U/L   AST 15 10 - 35 U/L   ALT 12 6 - 29 U/L  VITAMIN D 25 Hydroxy (Vit-D Deficiency, Fractures)     Status: None   Collection Time: 03/01/22  3:54 PM  Result Value Ref Range   Vit D, 25-Hydroxy 46 30 - 100 ng/mL    Comment: Vitamin D Status         25-OH Vitamin D: . Deficiency:                    <20 ng/mL Insufficiency:             20 - 29 ng/mL Optimal:                 > or = 30 ng/mL . For 25-OH Vitamin D testing on patients on  D2-supplementation and patients for whom quantitation  of D2 and D3 fractions is required, the [facility](TM) 25-OH VIT D, (D2,D3), LC/MS/MS is recommended: order  code [id number](patients >  104yr). . See Note 1 . Note 1 . For additional information, please refer to  http://education.[facility].com/faq/FAQ199  (This link is being provided for informational/ educational purposes only.)   Hemoglobin A1c     Status: Abnormal   Collection Time: 03/01/22  3:54 PM  Result Value Ref Range   Hgb A1c MFr Bld 5.8 (H) <5.7 % of total Hgb    Comment: For someone without known diabetes, a hemoglobin  A1c value between 5.7% and 6.4% is consistent with prediabetes and should be confirmed with a  follow-up test. . For someone with known diabetes, a value  <7% indicates that their diabetes is well controlled. A1c targets should be individualized based on duration of diabetes, age, comorbid conditions, and other considerations. . This assay result is consistent with an increased risk of diabetes. . Currently, no consensus exists regarding use of hemoglobin A1c for diagnosis of diabetes for children. .    Mean Plasma Glucose 120 mg/dL   eAG (mmol/L) 6.6 mmol/L    PHQ2/9:    04/26/2022    2:39 PM 03/01/2022    3:20 PM 10/25/2021    2:33 PM 04/06/2021   10:23 AM 10/06/2020   11:25 AM  Depression screen PHQ 2/9  Decreased Interest 0 0 0 0 0  Down, Depressed, Hopeless 0 0 0 0 0  PHQ - 2 Score 0 0 0 0 0  Altered sleeping 0 0 0 0 0  Tired, decreased energy 0 0 0 1 0  Change in appetite 0 0 0 0 0  Feeling bad or failure about yourself  0 0 0 0 0  Trouble concentrating 0 0 0 0 0  Moving slowly or fidgety/restless 0 0 0 0 0  Suicidal thoughts 0 0 0 0 0  PHQ-9 Score 0 0 0 1 0    phq 9 is negative   Fall Risk:    04/26/2022    2:39 PM 03/01/2022    3:21 PM 10/25/2021    2:33 PM 04/06/2021   10:23 AM 10/06/2020   11:11 AM  Fall Risk   Falls in the past year? 0 0 0 0 0  Number falls in past yr:   0 0 0  Injury with Fall?   0 0 0  Risk for fall due to : No Fall Risks No Fall Risks No Fall Risks    Follow up Education provided;Falls prevention discussed Falls prevention discussed;Education provided;Falls evaluation completed Falls prevention discussed        Functional Status Survey: Is the patient deaf or have difficulty hearing?: No Does the patient have difficulty seeing, even when wearing glasses/contacts?: No Does the patient have difficulty concentrating, remembering, or making decisions?: No Does the patient have difficulty walking or climbing stairs?: No Does the patient have difficulty dressing or bathing?: No Does the patient have difficulty doing errands alone such as visiting a doctor's office or shopping?:  No    Assessment & Plan  1. Major depression in remission (HCC)  - citalopram (CELEXA) 20 MG tablet; Take 1 tablet (20 mg total) by mouth daily.  Dispense: 90 tablet; Refill: 0  2. Anxiety, generalized  - citalopram (CELEXA) 20 MG tablet; Take 1 tablet (20 mg total) by mouth daily.  Dispense: 90 tablet; Refill: 0  3. Chronic tension-type headache, intractable  - butalbital-apap-caffeine-codeine (FIORICET WITH CODEINE) 50-325-40-30 MG capsule; Take 1 capsule by mouth every 6 (six) hours as needed for headache.  Dispense: 30 capsule; Refill: 0  4. Vitamin D deficiency  -  Vitamin D, Ergocalciferol, (DRISDOL) 1.25 MG (50000 UNIT) CAPS capsule; Take 1 capsule (50,000 Units total) by mouth once a week.  Dispense: 12 capsule; Refill: 0  5. PSVT (paroxysmal supraventricular tachycardia) (HCC)  - metoprolol tartrate (LOPRESSOR) 25 MG tablet; Take 0.5 tablets (12.5 mg total) by mouth daily as needed. Palpitation  Dispense: 30 tablet; Refill: 1   6. Pre-diabetes   7. Gastro-esophageal reflux disease without esophagitis

## 2022-04-26 ENCOUNTER — Ambulatory Visit (INDEPENDENT_AMBULATORY_CARE_PROVIDER_SITE_OTHER): Payer: BC Managed Care – PPO | Admitting: Family Medicine

## 2022-04-26 ENCOUNTER — Encounter: Payer: Self-pay | Admitting: Family Medicine

## 2022-04-26 VITALS — BP 118/70 | HR 72 | Temp 98.3°F | Resp 14 | Ht 64.0 in | Wt 149.0 lb

## 2022-04-26 DIAGNOSIS — F325 Major depressive disorder, single episode, in full remission: Secondary | ICD-10-CM | POA: Diagnosis not present

## 2022-04-26 DIAGNOSIS — G44221 Chronic tension-type headache, intractable: Secondary | ICD-10-CM | POA: Diagnosis not present

## 2022-04-26 DIAGNOSIS — F411 Generalized anxiety disorder: Secondary | ICD-10-CM | POA: Diagnosis not present

## 2022-04-26 DIAGNOSIS — K219 Gastro-esophageal reflux disease without esophagitis: Secondary | ICD-10-CM

## 2022-04-26 DIAGNOSIS — I471 Supraventricular tachycardia: Secondary | ICD-10-CM

## 2022-04-26 DIAGNOSIS — R7303 Prediabetes: Secondary | ICD-10-CM | POA: Diagnosis not present

## 2022-04-26 DIAGNOSIS — E559 Vitamin D deficiency, unspecified: Secondary | ICD-10-CM

## 2022-04-26 DIAGNOSIS — Z23 Encounter for immunization: Secondary | ICD-10-CM | POA: Diagnosis not present

## 2022-04-26 MED ORDER — BUTALBITAL-APAP-CAFF-COD 50-325-40-30 MG PO CAPS: 1.0000 | ORAL_CAPSULE | Freq: Four times a day (QID) | ORAL | 0 refills | Status: DC | PRN

## 2022-04-26 MED ORDER — METOPROLOL TARTRATE 25 MG PO TABS: 12.5000 mg | ORAL_TABLET | Freq: Every day | ORAL | 1 refills | Status: DC | PRN

## 2022-04-26 MED ORDER — VITAMIN D (ERGOCALCIFEROL) 1.25 MG (50000 UNIT) PO CAPS: 50000.0000 [IU] | ORAL_CAPSULE | ORAL | 0 refills | Status: DC

## 2022-04-26 MED ORDER — CITALOPRAM HYDROBROMIDE 20 MG PO TABS: 20.0000 mg | ORAL_TABLET | Freq: Every day | ORAL | 0 refills | Status: DC

## 2022-05-08 ENCOUNTER — Encounter: Payer: Self-pay | Admitting: Family Medicine

## 2022-05-08 ENCOUNTER — Telehealth (INDEPENDENT_AMBULATORY_CARE_PROVIDER_SITE_OTHER): Payer: BC Managed Care – PPO | Admitting: Family Medicine

## 2022-05-08 DIAGNOSIS — U071 COVID-19: Secondary | ICD-10-CM | POA: Diagnosis not present

## 2022-05-08 MED ORDER — BENZONATATE 100 MG PO CAPS: 100.0000 mg | ORAL_CAPSULE | Freq: Three times a day (TID) | ORAL | 0 refills | Status: DC | PRN

## 2022-05-08 NOTE — Addendum Note (Signed)
Addended by: Carlene Coria on: 05/08/2022 01:30 PM   Modules accepted: Orders

## 2022-05-08 NOTE — Progress Notes (Signed)
Name: Chelsea Decker   MRN: [id number]    DOB: 1960-08-23   Date:05/08/2022       Progress Note  Subjective  Chief Complaint  COVID Positive  I connected with  Chelsea Decker  on 05/08/22 at  2:20 PM EDT by telephone and verified that I am speaking with the correct person using two identifiers.  I discussed the limitations of evaluation and management by telemedicine and the availability of in person appointments. The patient expressed understanding and agreed to proceed with the virtual visit  Staff also discussed with the patient that there may be a patient responsible charge related to this service. Patient Location: at home  Provider Location: [facility] Additional Individuals present: alone   HPI  COVID-19: she states symptoms started two days ago, initially had rhinorrhea, nasal congestion. She states she went to work today and tested positive for COVID-19 . She denies SOB, wheezing . She was sent home from work.  She is taking otc cough drops and nyquil at night.   Patient Active Problem List   Diagnosis Date Noted   Mild major depression (HCC) 09/13/2018   Encounter for screening colonoscopy    History of shingles 04/17/2017   Post-menopause on HRT (hormone replacement therapy) 11/05/2015   Fasting hyperglycemia 08/10/2015   Anxiety, generalized 03/20/2015   Chronic tension-type headache, intractable 03/20/2015   Gastro-esophageal reflux disease without esophagitis 03/20/2015   IBS (irritable bowel syndrome) 03/20/2015   Menopause 03/20/2015   Vitamin D deficiency 03/20/2015   Vasovagal syncope 08/18/2014   PSVT (paroxysmal supraventricular tachycardia) (HCC) 04/12/2012   External hemorrhoids 07/15/2007    Past Surgical History:  Procedure Laterality Date   ABDOMINAL HYSTERECTOMY     2013   BREAST BIOPSY Left 2013   core w/clip - neg   COLONOSCOPY WITH PROPOFOL N/A 06/27/2018   Procedure: COLONOSCOPY WITH PROPOFOL;  Surgeon: Toney Reil, MD;  Location:  [facility] ENDOSCOPY;  Service: Gastroenterology;  Laterality: N/A;   LAPAROSCOPIC TOTAL HYSTERECTOMY      Family History  Problem Relation Age of Onset   Cancer Mother    Diabetes Mother    Cancer Father    Diabetes Father    Breast cancer Neg Hx     Social History   Socioeconomic History   Marital status: Divorced    Spouse name: Not on file   Number of children: 2   Years of education: Not on file   Highest education level: High school graduate  Occupational History   Occupation: house keeping   Tobacco Use   Smoking status: Never   Smokeless tobacco: Never  Vaping Use   Vaping Use: Never used  Substance and Sexual Activity   Alcohol use: No    Alcohol/week: 0.0 standard drinks of alcohol   Drug use: No   Sexual activity: Yes    Partners: Male  Other Topics Concern   Not on file  Social History Narrative   Divorced   Dating Fayrene Fearing since 2015   Lost her job at [employer] and is now working at a nursing home at [employer]    Her youngest child - son, has disability since birth, MR    Social Determinants of Health   Financial Resource Strain: Low Risk  (03/01/2022)   Overall Financial Resource Strain (CARDIA)    Difficulty of Paying Living Expenses: Not hard at all  Food Insecurity: No Food Insecurity (03/01/2022)   Hunger Vital Sign    Worried About Running  Out of Food in the Last Year: Never true    Ran Out of Food in the Last Year: Never true  Transportation Needs: No Transportation Needs (03/01/2022)   PRAPARE - Hydrologist (Medical): No    Lack of Transportation (Non-Medical): No  Physical Activity: Sufficiently Active (03/01/2022)   Exercise Vital Sign    Days of Exercise per Week: 4 days    Minutes of Exercise per Session: 60 min  Recent Concern: Physical Activity - Insufficiently Active (03/01/2022)   Exercise Vital Sign    Days of Exercise per Week: 1 day    Minutes of Exercise per Session: 30 min  Stress: No Stress  Concern Present (03/01/2022)   [place]    Feeling of Stress : Not at all  Social Connections: Moderately Isolated (03/01/2022)   Social Connection and Isolation Panel [NHANES]    Frequency of Communication with Friends and Family: Once a week    Frequency of Social Gatherings with Friends and Family: Once a week    Attends Religious Services: More than 4 times per year    Active Member of [employer] or Organizations: No    Attends Music therapist: More than 4 times per year    Marital Status: Divorced  Human resources officer Violence: Not At Risk (03/01/2022)   Humiliation, Afraid, Rape, and Kick questionnaire    Fear of Current or Ex-Partner: No    Emotionally Abused: No    Physically Abused: No    Sexually Abused: No     Current Outpatient Medications:    acetaminophen (TYLENOL) 500 MG chewable tablet, Chew 500 mg by mouth every 6 (six) hours as needed., Disp: , Rfl:    butalbital-apap-caffeine-codeine (FIORICET WITH CODEINE) 50-325-40-30 MG capsule, Take 1 capsule by mouth every 6 (six) hours as needed for headache., Disp: 30 capsule, Rfl: 0   Cholecalciferol (VITAMIN D) 2000 UNITS tablet, Take 2,000 Units by mouth daily., Disp: , Rfl:    citalopram (CELEXA) 20 MG tablet, Take 1 tablet (20 mg total) by mouth daily., Disp: 90 tablet, Rfl: 0   linaclotide (LINZESS) 72 MCG capsule, Take 1 capsule (72 mcg total) by mouth daily. (Patient taking differently: Take 72 mcg by mouth daily as needed.), Disp: 30 capsule, Rfl: 2   metoprolol tartrate (LOPRESSOR) 25 MG tablet, Take 0.5 tablets (12.5 mg total) by mouth daily as needed. Palpitation, Disp: 30 tablet, Rfl: 1   triamcinolone (KENALOG) 0.1 %, Apply 1 application topically 2 (two) times daily. Mix with Eucerin at home and apply on both inner legs, Disp: 80 g, Rfl: 0   Vitamin D, Ergocalciferol, (DRISDOL) 1.25 MG (50000 UNIT) CAPS capsule, Take 1 capsule (50,000 Units  total) by mouth once a week., Disp: 12 capsule, Rfl: 0  No Known Allergies  I personally reviewed active problem list, medication list, allergies, family history, social history, health maintenance with the patient/caregiver today.   ROS  Ten systems reviewed and is negative except as mentioned in HPI   Objective  Virtual encounter, vitals not obtained.  There is no height or weight on file to calculate BMI.  Physical Exam  Awake, alert and oriented   PHQ2/9:    04/26/2022    2:39 PM 03/01/2022    3:20 PM 10/25/2021    2:33 PM 04/06/2021   10:23 AM 10/06/2020   11:25 AM  Depression screen PHQ 2/9  Decreased Interest 0 0 0 0 0  Down,  Depressed, Hopeless 0 0 0 0 0  PHQ - 2 Score 0 0 0 0 0  Altered sleeping 0 0 0 0 0  Tired, decreased energy 0 0 0 1 0  Change in appetite 0 0 0 0 0  Feeling bad or failure about yourself  0 0 0 0 0  Trouble concentrating 0 0 0 0 0  Moving slowly or fidgety/restless 0 0 0 0 0  Suicidal thoughts 0 0 0 0 0  PHQ-9 Score 0 0 0 1 0   PHQ-2/9 Result is negative.    Fall Risk:    04/26/2022    2:39 PM 03/01/2022    3:21 PM 10/25/2021    2:33 PM 04/06/2021   10:23 AM 10/06/2020   11:11 AM  Fall Risk   Falls in the past year? 0 0 0 0 0  Number falls in past yr:   0 0 0  Injury with Fall?   0 0 0  Risk for fall due to : No Fall Risks No Fall Risks No Fall Risks    Follow up Education provided;Falls prevention discussed Falls prevention discussed;Education provided;Falls evaluation completed Falls prevention discussed       Assessment & Plan  1. COVID-19  - benzonatate (TESSALON) 100 MG capsule; Take 1-2 capsules (100-200 mg total) by mouth 3 (three) times daily as needed.  Dispense: 40 capsule; Refill: 0   Stay hydrated, rest, quarantine ,wear a mask for a total of 10 days   I discussed the assessment and treatment plan with the patient. The patient was provided an opportunity to ask questions and all were answered. The patient agreed with  the plan and demonstrated an understanding of the instructions.  The patient was advised to call back or seek an in-person evaluation if the symptoms worsen or if the condition fails to improve as anticipated.  I provided 15  minutes of non-face-to-face time during this encounter.

## 2022-10-24 NOTE — Progress Notes (Unsigned)
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 No photophobia or phonophobia, or focal deficits. She says that the headaches last for about 2-3 hours , it is down to 3-4 episodes per month   PSVT: she lost insurance last year and lost to follow up with cardiologist She has been taking her lopressor BID prn only, she took 60 pills in the past 6 months and needs a refill today.   GERD: doing well for a while now not currently taking medications . She had some sharp/burning substernal pain this morning, she had bacon and eggs for breakfast, she took Tums and symptoms resolved   Pre-diabetes: A1C has been stable at 5.8 % .  She denies any polyphagia, polydipsia or polyuria.  She is cutting down on sweets. Recheck it yearly   Viatamin D deficiency:  she has been taking rx vitamin D and last level was normal   Dyslipidemia: discussed diet and life style modifications , last LDL was 135   The 10-year ASCVD risk score (Arnett DK, et al., 2019) is: 4%   Values used to calculate the score:     Age: 61 years     Sex: Female     Is Non-Hispanic African American: Yes     Diabetic: No     Tobacco smoker: No     Systolic Blood Pressure: XX123456 mmHg     Is BP treated: No     HDL Cholesterol: 60 mg/dL     Total Cholesterol: 214 mg/dL   Patient Active Problem List   Diagnosis Date Noted   Mild major depression ([place]) 09/13/2018   History of shingles 04/17/2017   Post-menopause on HRT (hormone  replacement therapy) 11/05/2015   Fasting hyperglycemia 08/10/2015   Anxiety, generalized 03/20/2015   Chronic tension-type headache, intractable 03/20/2015   Gastro-esophageal reflux disease without esophagitis 03/20/2015   IBS (irritable bowel syndrome) 03/20/2015   Menopause 03/20/2015   Vitamin D deficiency 03/20/2015   Vasovagal syncope 08/18/2014   PSVT (paroxysmal supraventricular tachycardia) 04/12/2012   External hemorrhoids 07/15/2007    Past Surgical History:  Procedure Laterality Date   ABDOMINAL HYSTERECTOMY     2013   BREAST BIOPSY Left 2013   core w/clip - neg   COLONOSCOPY WITH PROPOFOL N/A 06/27/2018   Procedure: COLONOSCOPY WITH PROPOFOL;  Surgeon: Lin Landsman, MD;  Location: [facility] ENDOSCOPY;  Service: Gastroenterology;  Laterality: N/A;   LAPAROSCOPIC TOTAL HYSTERECTOMY      Family History  Problem Relation Age of Onset   Cancer Mother    Diabetes Mother    Cancer Father    Diabetes Father    Breast cancer Neg Hx     Social History   Tobacco Use   Smoking status: Never   Smokeless tobacco: Never  Substance Use Topics   Alcohol use: No    Alcohol/week: 0.0 standard drinks of alcohol  Current Outpatient Medications:    acetaminophen (TYLENOL) 500 MG chewable tablet, Chew 500 mg by mouth every 6 (six) hours as needed., Disp: , Rfl:    citalopram (CELEXA) 20 MG tablet, Take 1 tablet (20 mg total) by mouth daily., Disp: 90 tablet, Rfl: 0   linaclotide (LINZESS) 72 MCG capsule, Take 1 capsule (72 mcg total) by mouth daily. (Patient taking differently: Take 72 mcg by mouth daily as needed.), Disp: 30 capsule, Rfl: 2   triamcinolone (KENALOG) 0.1 %, Apply 1 application topically 2 (two) times daily. Mix with Eucerin at home and apply on both inner legs, Disp: 80 g, Rfl: 0   butalbital-apap-caffeine-codeine (FIORICET WITH CODEINE) 50-325-40-30 MG capsule, Take 1 capsule by mouth every 6 (six) hours as needed for headache., Disp: 30 capsule, Rfl:  0   metoprolol tartrate (LOPRESSOR) 25 MG tablet, Take 0.5 tablets (12.5 mg total) by mouth daily as needed. Palpitation, Disp: 30 tablet, Rfl: 1   Vitamin D, Ergocalciferol, (DRISDOL) 1.25 MG (50000 UNIT) CAPS capsule, Take 1 capsule (50,000 Units total) by mouth once a week., Disp: 12 capsule, Rfl: 0  No Known Allergies  I personally reviewed active problem list, medication list, allergies, family history, social history, health maintenance with the patient/caregiver today.   ROS  Constitutional: Negative for fever or weight change.  Respiratory: Negative for cough and shortness of breath.   Cardiovascular: Negative for chest pain or palpitations.  Gastrointestinal: Negative for abdominal pain, no bowel changes.  Musculoskeletal: Negative for gait problem or joint swelling.  Skin: Negative for rash.  Neurological: Negative for dizziness or headache.  No other specific complaints in a complete review of systems (except as listed in HPI above).   Objective  Vitals:   10/25/22 1446  BP: 112/72  Pulse: 63  Resp: 14  Temp: 97.8 F (36.6 C)  TempSrc: Oral  SpO2: 96%  Weight: 146 lb 6.4 oz (66.4 kg)  Height: '5\' 4"'$  (1.626 m)    Body mass index is 25.13 kg/m.  Physical Exam  Constitutional: Patient appears well-developed and well-nourished.  No distress.  HEENT: head atraumatic, normocephalic, pupils equal and reactive to light, neck supple Cardiovascular: Normal rate, regular rhythm and normal heart sounds.  No murmur heard. No BLE edema. Pulmonary/Chest: Effort normal and breath sounds normal. No respiratory distress. Abdominal: Soft.  There is no tenderness. Psychiatric: Patient has a normal mood and affect. behavior is normal. Judgment and thought content normal.    PHQ2/9:    10/25/2022    3:03 PM 05/08/2022   11:13 AM 04/26/2022    2:39 PM 03/01/2022    3:20 PM 10/25/2021    2:33 PM  Depression screen PHQ 2/9  Decreased Interest 0 0 0 0 0  Down, Depressed, Hopeless  0 0 0 0 0  PHQ - 2 Score 0 0 0 0 0  Altered sleeping 0 0 0 0 0  Tired, decreased energy 0 0 0 0 0  Change in appetite 0 0 0 0 0  Feeling bad or failure about yourself  0 0 0 0 0  Trouble concentrating 0 0 0 0 0  Moving slowly or fidgety/restless 0 0 0 0 0  Suicidal thoughts 0 0 0 0 0  PHQ-9 Score 0 0 0 0 0    phq 9 is negative   Fall Risk:    10/25/2022    3:03 PM 05/08/2022   11:13 AM 04/26/2022    2:39 PM 03/01/2022    3:21 PM 10/25/2021  2:33 PM  Fall Risk   Falls in the past year? 0 0 0 0 0  Number falls in past yr:     0  Injury with Fall?     0  Risk for fall due to : No Fall Risks No Fall Risks No Fall Risks No Fall Risks No Fall Risks  Follow up Falls prevention discussed;Education provided;Falls evaluation completed  Education provided;Falls prevention discussed Falls prevention discussed;Education provided;Falls evaluation completed Falls prevention discussed      Functional Status Survey: Is the patient deaf or have difficulty hearing?: No Does the patient have difficulty seeing, even when wearing glasses/contacts?: No Does the patient have difficulty concentrating, remembering, or making decisions?: No Does the patient have difficulty walking or climbing stairs?: No Does the patient have difficulty dressing or bathing?: No Does the patient have difficulty doing errands alone such as visiting a doctor's office or shopping?: No    Assessment & Plan  1. Major depression in remission [facility])  Doing well , still taking citalopram   2. Anxiety, generalized  Stable  3. Vitamin D deficiency  - Vitamin D, Ergocalciferol, (DRISDOL) 1.25 MG (50000 UNIT) CAPS capsule; Take 1 capsule (50,000 Units total) by mouth once a week.  Dispense: 12 capsule; Refill: 0  4. Gastro-esophageal reflux disease without esophagitis   5. PSVT (paroxysmal supraventricular tachycardia)  - metoprolol tartrate (LOPRESSOR) 25 MG tablet; Take 0.5 tablets (12.5 mg total) by mouth daily  as needed. Palpitation  Dispense: 30 tablet; Refill: 1  6. Pre-diabetes  On life style modification   7. Chronic tension-type headache, intractable  - butalbital-apap-caffeine-codeine (FIORICET WITH CODEINE) 50-325-40-30 MG capsule; Take 1 capsule by mouth every 6 (six) hours as needed for headache.  Dispense: 30 capsule; Refill: 0

## 2022-10-25 ENCOUNTER — Encounter: Payer: Self-pay | Admitting: Family Medicine

## 2022-10-25 ENCOUNTER — Ambulatory Visit (INDEPENDENT_AMBULATORY_CARE_PROVIDER_SITE_OTHER): Payer: BC Managed Care – PPO | Admitting: Family Medicine

## 2022-10-25 VITALS — BP 112/72 | HR 63 | Temp 97.8°F | Resp 14 | Ht 64.0 in | Wt 146.4 lb

## 2022-10-25 DIAGNOSIS — R7303 Prediabetes: Secondary | ICD-10-CM

## 2022-10-25 DIAGNOSIS — K219 Gastro-esophageal reflux disease without esophagitis: Secondary | ICD-10-CM | POA: Diagnosis not present

## 2022-10-25 DIAGNOSIS — E559 Vitamin D deficiency, unspecified: Secondary | ICD-10-CM

## 2022-10-25 DIAGNOSIS — F411 Generalized anxiety disorder: Secondary | ICD-10-CM | POA: Diagnosis not present

## 2022-10-25 DIAGNOSIS — F325 Major depressive disorder, single episode, in full remission: Secondary | ICD-10-CM

## 2022-10-25 DIAGNOSIS — G44221 Chronic tension-type headache, intractable: Secondary | ICD-10-CM

## 2022-10-25 DIAGNOSIS — I471 Supraventricular tachycardia, unspecified: Secondary | ICD-10-CM

## 2022-10-25 MED ORDER — VITAMIN D (ERGOCALCIFEROL) 1.25 MG (50000 UNIT) PO CAPS: 50000.0000 [IU] | ORAL_CAPSULE | ORAL | 0 refills | Status: DC

## 2022-10-25 MED ORDER — BUTALBITAL-APAP-CAFF-COD 50-325-40-30 MG PO CAPS: 1.0000 | ORAL_CAPSULE | Freq: Four times a day (QID) | ORAL | 0 refills | Status: DC | PRN

## 2022-10-25 MED ORDER — METOPROLOL TARTRATE 25 MG PO TABS: 12.5000 mg | ORAL_TABLET | Freq: Every day | ORAL | 1 refills | Status: DC | PRN

## 2022-11-01 DIAGNOSIS — A049 Bacterial intestinal infection, unspecified: Secondary | ICD-10-CM | POA: Diagnosis not present

## 2022-11-01 DIAGNOSIS — R1084 Generalized abdominal pain: Secondary | ICD-10-CM | POA: Diagnosis not present

## 2022-11-01 DIAGNOSIS — Z03818 Encounter for observation for suspected exposure to other biological agents ruled out: Secondary | ICD-10-CM | POA: Diagnosis not present

## 2022-11-01 DIAGNOSIS — R112 Nausea with vomiting, unspecified: Secondary | ICD-10-CM | POA: Diagnosis not present

## 2023-01-31 ENCOUNTER — Other Ambulatory Visit: Payer: Self-pay | Admitting: Family Medicine

## 2023-01-31 DIAGNOSIS — E559 Vitamin D deficiency, unspecified: Secondary | ICD-10-CM

## 2023-03-06 ENCOUNTER — Encounter: Payer: 59 | Admitting: Family Medicine

## 2023-03-09 NOTE — Progress Notes (Unsigned)
Name: Chelsea Decker   MRN: [id number]    DOB: 30-Mar-1961   Date:03/12/2023       Progress Note  Subjective  Chief Complaint  Annual Exam  HPI  Patient presents for annual CPE.  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  AUDIT-C Score 0      Depression: Phq 9 is  negative    03/12/2023    8:32 AM 10/25/2022    3:03 PM 05/08/2022   11:13 AM 04/26/2022    2:39 PM 03/01/2022    3:20 PM  Depression screen PHQ 2/9  Decreased Interest 0 0 0 0 0  Down, Depressed, Hopeless 0 0 0 0 0  PHQ - 2 Score 0 0 0 0 0  Altered sleeping 0 0 0 0 0  Tired, decreased energy 0 0 0 0 0  Change in appetite 0 0 0 0 0  Feeling bad or failure about yourself  0 0 0 0 0  Trouble concentrating 0 0 0 0 0  Moving slowly or fidgety/restless 0 0 0 0 0  Suicidal thoughts 0 0 0 0 0  PHQ-9 Score 0 0 0 0 0   Hypertension: BP Readings from Last 3 Encounters:  03/12/23 116/68  10/25/22 112/72  04/26/22 118/70   Obesity: Wt Readings from Last 3 Encounters:  03/12/23 143 lb (64.9 kg)  10/25/22 146 lb 6.4 oz (66.4 kg)  04/26/22 149 lb (67.6 kg)   BMI Readings from Last 3 Encounters:  03/12/23 24.55 kg/m  10/25/22 25.13 kg/m  04/26/22 25.58 kg/m     Vaccines:   RSV: discussed with patient  Tdap: up to date Shingrix: needs to get it  Pneumonia: N/A Flu: up to date COVID-19: up to date   Hep C Screening: 04/29/12 STD testing and prevention (HIV/chl/gon/syphilis): 06/23/17 Intimate partner violence: negative screen  Sexual History : one partner  Menstrual History/LMP/Abnormal Bleeding: post menopausal  Discussed importance of follow up if any post-menopausal bleeding: yes  Incontinence Symptoms: negative for symptoms   Breast cancer:  - Last Mammogram:  02/01/22 - BRCA gene screening: N/A  Osteoporosis Prevention : Discussed high calcium and vitamin D supplementation, weight bearing exercises Bone density: 05/23/12   Cervical cancer screening: N/A - s/p hysterectomy   Skin cancer: Discussed monitoring for atypical lesions  Colorectal cancer: Due 06/28/23   Lung cancer:  Low Dose CT Chest recommended if Age 53-80 years, 20 pack-year currently smoking OR have quit w/in 15years. Patient does not qualify for screen   ECG: 09/04/17  Advanced Care Planning: A voluntary discussion about advance care planning including the explanation and discussion of advance directives.  Discussed health care proxy and Living will, and the patient was able to identify a health care proxy as [name]  .  Patient does not have a living will and power of attorney of health care   Lipids: Lab Results  Component Value Date   CHOL 214 (H) 03/01/2022   CHOL 196 12/10/2017   CHOL 189 11/27/2016   Lab Results  Component Value Date   HDL 60 03/01/2022   HDL 67 12/10/2017   HDL 65 11/27/2016   Lab Results  Component Value Date   LDLCALC 135 (H) 03/01/2022   LDLCALC 112 (H) 12/10/2017  LDLCALC 114 (H) 11/27/2016   Lab Results  Component Value Date   TRIG 89 03/01/2022   TRIG 84 12/10/2017   TRIG 49 11/27/2016   Lab Results  Component Value Date   CHOLHDL 3.6 03/01/2022   CHOLHDL 2.9 12/10/2017   CHOLHDL 2.9 11/27/2016   No results found for: "LDLDIRECT"  Glucose: Glucose  Date Value Ref Range Status  11/26/2014 97 mg/dL Final    Comment:    40-98 NOTE: New Reference Range  10/20/14   03/29/2014 101 (H) 65 - 99 mg/dL Final  11/91/4782 78 65 - 99 mg/dL Final   Glucose, Bld  Date Value Ref Range Status  03/01/2022 80 65 - 99 mg/dL Final    Comment:    .            Fasting reference interval .   04/06/2021 88 65 - 99 mg/dL Final    Comment:    .            Fasting reference interval .   12/10/2017 82 65 - 139 mg/dL Final     Comment:    .        Non-fasting reference interval .     Patient Active Problem List   Diagnosis Date Noted   Mild major depression (HCC) 09/13/2018   History of shingles 04/17/2017   Post-menopause on HRT (hormone replacement therapy) 11/05/2015   Fasting hyperglycemia 08/10/2015   Anxiety, generalized 03/20/2015   Chronic tension-type headache, intractable 03/20/2015   Gastro-esophageal reflux disease without esophagitis 03/20/2015   IBS (irritable bowel syndrome) 03/20/2015   Menopause 03/20/2015   Vitamin D deficiency 03/20/2015   Vasovagal syncope 08/18/2014   PSVT (paroxysmal supraventricular tachycardia) 04/12/2012   External hemorrhoids 07/15/2007    Past Surgical History:  Procedure Laterality Date   ABDOMINAL HYSTERECTOMY     2013   BREAST BIOPSY Left 2013   core w/clip - neg   COLONOSCOPY WITH PROPOFOL N/A 06/27/2018   Procedure: COLONOSCOPY WITH PROPOFOL;  Surgeon: Toney Reil, MD;  Location: [facility] ENDOSCOPY;  Service: Gastroenterology;  Laterality: N/A;   LAPAROSCOPIC TOTAL HYSTERECTOMY      Family History  Problem Relation Age of Onset   Cancer Mother    Diabetes Mother    Cancer Father    Diabetes Father    Breast cancer Neg Hx     Social History   Socioeconomic History   Marital status: Divorced    Spouse name: Not on file   Number of children: 2   Years of education: Not on file   Highest education level: High school graduate  Occupational History   Occupation: house keeping   Tobacco Use   Smoking status: Never   Smokeless tobacco: Never  Vaping Use   Vaping status: Never Used  Substance and Sexual Activity   Alcohol use: No    Alcohol/week: 0.0 standard drinks of alcohol   Drug use: No   Sexual activity: Yes    Partners: Male  Other Topics Concern   Not on file  Social History Narrative   Divorced   Dating Fayrene Fearing since 2015   Lost her job at [employer] and is now working at a nursing home at [employer]     Her youngest child - son, has disability since birth, MR    Social Determinants of Health   Financial Resource Strain: Low Risk  (03/12/2023)   Overall Financial Resource Strain (CARDIA)    Difficulty of Paying Living Expenses:  Not hard at all  Food Insecurity: No Food Insecurity (03/12/2023)   Hunger Vital Sign    Worried About Running Out of Food in the Last Year: Never true    Ran Out of Food in the Last Year: Never true  Transportation Needs: No Transportation Needs (03/12/2023)   PRAPARE - Administrator, Civil Service (Medical): No    Lack of Transportation (Non-Medical): No  Physical Activity: Insufficiently Active (03/12/2023)   Exercise Vital Sign    Days of Exercise per Week: 2 days    Minutes of Exercise per Session: 30 min  Stress: No Stress Concern Present (03/12/2023)   [employer] of Occupational Health - Occupational Stress Questionnaire    Feeling of Stress : Not at all  Social Connections: Moderately Isolated (03/12/2023)   Social Connection and Isolation Panel [NHANES]    Frequency of Communication with Friends and Family: Once a week    Frequency of Social Gatherings with Friends and Family: Once a week    Attends Religious Services: More than 4 times per year    Active Member of [employer] or Organizations: No    Attends Engineer, structural: More than 4 times per year    Marital Status: Divorced  Catering manager Violence: Not At Risk (03/12/2023)   Humiliation, Afraid, Rape, and Kick questionnaire    Fear of Current or Ex-Partner: No    Emotionally Abused: No    Physically Abused: No    Sexually Abused: No     Current Outpatient Medications:    acetaminophen (TYLENOL) 500 MG chewable tablet, Chew 500 mg by mouth every 6 (six) hours as needed., Disp: , Rfl:    butalbital-apap-caffeine-codeine (FIORICET WITH CODEINE) 50-325-40-30 MG capsule, Take 1 capsule by mouth every 6 (six) hours as needed for headache., Disp: 30 capsule, Rfl: 0    citalopram (CELEXA) 20 MG tablet, Take 1 tablet (20 mg total) by mouth daily., Disp: 90 tablet, Rfl: 0   linaclotide (LINZESS) 72 MCG capsule, Take 1 capsule (72 mcg total) by mouth daily. (Patient taking differently: Take 72 mcg by mouth daily as needed.), Disp: 30 capsule, Rfl: 2   metoprolol tartrate (LOPRESSOR) 25 MG tablet, Take 0.5 tablets (12.5 mg total) by mouth daily as needed. Palpitation, Disp: 30 tablet, Rfl: 1   triamcinolone (KENALOG) 0.1 %, Apply 1 application topically 2 (two) times daily. Mix with Eucerin at home and apply on both inner legs, Disp: 80 g, Rfl: 0   Vitamin D, Ergocalciferol, (DRISDOL) 1.25 MG (50000 UNIT) CAPS capsule, Take 1 capsule by mouth once a week, Disp: 12 capsule, Rfl: 0  No Known Allergies   ROS  Constitutional: Negative for fever or weight change.  Respiratory: Negative for cough and shortness of breath.   Cardiovascular: Negative for chest pain or palpitations.  Gastrointestinal: Negative for abdominal pain, no bowel changes.  Musculoskeletal: Negative for gait problem or joint swelling.  Skin: Negative for rash.  Neurological: Negative for dizziness or headache.  No other specific complaints in a complete review of systems (except as listed in HPI above).   Objective  Vitals:   03/12/23 0844  BP: 116/68  Pulse: 64  Resp: 16  SpO2: 98%  Weight: 143 lb (64.9 kg)  Height: 5\' 4"  (1.626 m)    Body mass index is 24.55 kg/m.  Physical Exam  Constitutional: Patient appears well-developed and well-nourished. No distress.  HENT: Head: Normocephalic and atraumatic. Ears: B TMs ok, no erythema or effusion; Nose: Nose normal.  Mouth/Throat: Oropharynx is clear and moist. No oropharyngeal exudate. Poor dentition Eyes: Conjunctivae and EOM are normal. Pupils are equal, round, and reactive to light. No scleral icterus.  Neck: Normal range of motion. Neck supple. No JVD present. No thyromegaly present.  Cardiovascular: Normal rate, regular rhythm  and normal heart sounds.  No murmur heard. No BLE edema. Pulmonary/Chest: Effort normal and breath sounds normal. No respiratory distress. Abdominal: Soft. Bowel sounds are normal, no distension. There is no tenderness. no masses Breast: no lumps or masses, no nipple discharge or rashes FEMALE GENITALIA:  Not done  RECTAL: not done  Musculoskeletal: Normal range of motion, no joint effusions. No gross deformities Neurological: he is alert and oriented to person, place, and time. No cranial nerve deficit. Coordination, balance, strength, speech and gait are normal.  Skin: Skin is warm and dry. No rash noted. No erythema.  Psychiatric: Patient has a normal mood and affect. behavior is normal. Judgment and thought content normal.    Fall Risk:    03/12/2023    8:32 AM 10/25/2022    3:03 PM 05/08/2022   11:13 AM 04/26/2022    2:39 PM 03/01/2022    3:21 PM  Fall Risk   Falls in the past year? 0 0 0 0 0  Number falls in past yr: 0      Injury with Fall? 0      Risk for fall due to : No Fall Risks No Fall Risks No Fall Risks No Fall Risks No Fall Risks  Follow up Falls prevention discussed Falls prevention discussed;Education provided;Falls evaluation completed  Education provided;Falls prevention discussed Falls prevention discussed;Education provided;Falls evaluation completed     Functional Status Survey: Is the patient deaf or have difficulty hearing?: No Does the patient have difficulty seeing, even when wearing glasses/contacts?: No Does the patient have difficulty concentrating, remembering, or making decisions?: No Does the patient have difficulty walking or climbing stairs?: No Does the patient have difficulty dressing or bathing?: No Does the patient have difficulty doing errands alone such as visiting a doctor's office or shopping?: No   Assessment & Plan  1. Well adult exam  - Lipid panel - CBC with Differential/Platelet - COMPLETE METABOLIC PANEL WITH GFR - MM 3D  SCREENING MAMMOGRAM BILATERAL BREAST; Future - Hemoglobin A1c  2. Pre-diabetes  - Hemoglobin A1c  3. Long-term use of high-risk medication  - CBC with Differential/Platelet - COMPLETE METABOLIC PANEL WITH GFR  4. Lipid screening  - Lipid panel  5. Breast cancer screening by mammogram  - MM 3D SCREENING MAMMOGRAM BILATERAL BREAST; Future  6. Colon cancer screening  - Ambulatory referral to Gastroenterology  7. Osteoporosis screening  - DG Bone Density; Future  8. Ovarian failure  - DG Bone Density; Future    -USPSTF grade A and B recommendations reviewed with patient; age-appropriate recommendations, preventive care, screening tests, etc discussed and encouraged; healthy living encouraged; see AVS for patient education given to patient -Discussed importance of 150 minutes of physical activity weekly, eat two servings of fish weekly, eat one serving of tree nuts ( cashews, pistachios, pecans, almonds.Marland Kitchen) every other day, eat 6 servings of fruit/vegetables daily and drink plenty of water and avoid sweet beverages.   -Reviewed Health Maintenance: Yes.

## 2023-03-12 ENCOUNTER — Encounter: Payer: Self-pay | Admitting: Family Medicine

## 2023-03-12 ENCOUNTER — Ambulatory Visit (INDEPENDENT_AMBULATORY_CARE_PROVIDER_SITE_OTHER): Payer: BC Managed Care – PPO | Admitting: Family Medicine

## 2023-03-12 VITALS — BP 116/68 | HR 64 | Resp 16 | Ht 64.0 in | Wt 143.0 lb

## 2023-03-12 DIAGNOSIS — Z0000 Encounter for general adult medical examination without abnormal findings: Secondary | ICD-10-CM

## 2023-03-12 DIAGNOSIS — Z1211 Encounter for screening for malignant neoplasm of colon: Secondary | ICD-10-CM

## 2023-03-12 DIAGNOSIS — Z1231 Encounter for screening mammogram for malignant neoplasm of breast: Secondary | ICD-10-CM

## 2023-03-12 DIAGNOSIS — R7303 Prediabetes: Secondary | ICD-10-CM | POA: Diagnosis not present

## 2023-03-12 DIAGNOSIS — Z79899 Other long term (current) drug therapy: Secondary | ICD-10-CM | POA: Diagnosis not present

## 2023-03-12 DIAGNOSIS — Z13220 Encounter for screening for lipoid disorders: Secondary | ICD-10-CM

## 2023-03-12 DIAGNOSIS — E2839 Other primary ovarian failure: Secondary | ICD-10-CM

## 2023-03-12 DIAGNOSIS — Z13820 Encounter for screening for osteoporosis: Secondary | ICD-10-CM

## 2023-03-12 LAB — CBC WITH DIFFERENTIAL/PLATELET
Absolute Monocytes: 442 cells/uL (ref 200–950)
Basophils Absolute: 43 cells/uL (ref 0–200)
Basophils Relative: 0.5 %
Eosinophils Absolute: 213 cells/uL (ref 15–500)
Eosinophils Relative: 2.5 %
HCT: 40.4 % (ref 35.0–45.0)
Hemoglobin: 13 g/dL (ref 11.7–15.5)
Lymphs Abs: 2193 cells/uL (ref 850–3900)
MCH: 29.1 pg (ref 27.0–33.0)
MCHC: 32.2 g/dL (ref 32.0–36.0)
MCV: 90.6 fL (ref 80.0–100.0)
MPV: 12.1 fL (ref 7.5–12.5)
Monocytes Relative: 5.2 %
Neutro Abs: 5610 cells/uL (ref 1500–7800)
Neutrophils Relative %: 66 %
Platelets: 203 10*3/uL (ref 140–400)
RBC: 4.46 10*6/uL (ref 3.80–5.10)
RDW: 13.7 % (ref 11.0–15.0)
Total Lymphocyte: 25.8 %
WBC: 8.5 10*3/uL (ref 3.8–10.8)

## 2023-03-12 NOTE — Patient Instructions (Addendum)
You need Shingrix and RSV vaccines at local pharmacy   Schedule mammogram and bone density at the same time at [facility]

## 2023-04-26 NOTE — Progress Notes (Signed)
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   She is less stressed and doing better, not taking celexa as prescribed, taking it prn, we will switch to buspar to take prn    Tension Headaches: She has episodes of headaches  pain is described as a throbbing or sharp sensation, and radiates to right side of neck, sometimes on left side, at times on temporal area. No photophobia or phonophobia, or focal deficits. She says that the headaches last for about 2-3 hours, she states takes Tylenol first and if no improvement takes Fioricet, on average 6 episodes per month   PSVT: she lost insurance last year and lost to follow up with cardiologist She has been taking her lopressor BID prn only, she needs a refill today   GERD: doing well at this time, not taking medications   Pre-diabetes: last A1C was a little higher 6.1 % .  She denies any polyphagia, polydipsia or polyuria.  Reminded her of low carbohydrate diet again   Viatamin D deficiency:  she has been taking rx vitamin D and last level was normal   Dyslipidemia: discussed diet and life style modifications , improved on LDL down to 124   The 10-year ASCVD risk score (Arnett DK, et al., 2019) is: 4.5%   Values used to calculate the score:     Age: 62 years     Sex: Female     Is Non-Hispanic African American: Yes     Diabetic: No     Tobacco smoker: No     Systolic Blood Pressure: 120 mmHg     Is BP treated: No     HDL Cholesterol: 62 mg/dL     Total Cholesterol: 200 mg/dL   Patient Active Problem List   Diagnosis Date Noted   Mild major depression (HCC) 09/13/2018   History of shingles 04/17/2017   Post-menopause on HRT (hormone replacement therapy) 11/05/2015   Fasting hyperglycemia 08/10/2015   Anxiety, generalized 03/20/2015   Chronic  tension-type headache, intractable 03/20/2015   Gastro-esophageal reflux disease without esophagitis 03/20/2015   IBS (irritable bowel syndrome) 03/20/2015   Menopause 03/20/2015   Vitamin D deficiency 03/20/2015   Vasovagal syncope 08/18/2014   PSVT (paroxysmal supraventricular tachycardia) 04/12/2012   External hemorrhoids 07/15/2007    Past Surgical History:  Procedure Laterality Date   ABDOMINAL HYSTERECTOMY     2013   BREAST BIOPSY Left 2013   core w/clip - neg   COLONOSCOPY WITH PROPOFOL N/A 06/27/2018   Procedure: COLONOSCOPY WITH PROPOFOL;  Surgeon: Toney Reil, MD;  Location: [facility] ENDOSCOPY;  Service: Gastroenterology;  Laterality: N/A;   LAPAROSCOPIC TOTAL HYSTERECTOMY      Family History  Problem Relation Age of Onset   Cancer Mother    Diabetes Mother    Cancer Father    Diabetes Father    Breast cancer Neg Hx     Social History   Tobacco Use   Smoking status: Never   Smokeless tobacco: Never  Substance Use Topics   Alcohol use: No    Alcohol/week: 0.0 standard drinks of alcohol     Current Outpatient Medications:    acetaminophen (TYLENOL) 500 MG chewable tablet, Chew 500 mg by mouth every 6 (six) hours as needed.,  Disp: , Rfl:    butalbital-apap-caffeine-codeine (FIORICET WITH CODEINE) 50-325-40-30 MG capsule, Take 1 capsule by mouth every 6 (six) hours as needed for headache., Disp: 30 capsule, Rfl: 0   citalopram (CELEXA) 20 MG tablet, Take 1 tablet (20 mg total) by mouth daily., Disp: 90 tablet, Rfl: 0   linaclotide (LINZESS) 72 MCG capsule, Take 1 capsule (72 mcg total) by mouth daily. (Patient taking differently: Take 72 mcg by mouth daily as needed.), Disp: 30 capsule, Rfl: 2   metoprolol tartrate (LOPRESSOR) 25 MG tablet, Take 0.5 tablets (12.5 mg total) by mouth daily as needed. Palpitation, Disp: 30 tablet, Rfl: 1   triamcinolone (KENALOG) 0.1 %, Apply 1 application topically 2 (two) times daily. Mix with Eucerin at home and apply on both  inner legs, Disp: 80 g, Rfl: 0   Vitamin D, Ergocalciferol, (DRISDOL) 1.25 MG (50000 UNIT) CAPS capsule, Take 1 capsule by mouth once a week, Disp: 12 capsule, Rfl: 0  No Known Allergies  I personally reviewed active problem list, medication list, allergies, family history, social history, health maintenance with the patient/caregiver today.   ROS  Ten systems reviewed and is negative except as mentioned in HPI    Objective  Vitals:   04/27/23 1451  BP: 120/68  Pulse: 78  Resp: 16  Temp: 97.9 F (36.6 C)  SpO2: 99%  Weight: 145 lb (65.8 kg)  Height: 5\' 4"  (1.626 m)    Body mass index is 24.89 kg/m.  Physical Exam  Constitutional: Patient appears well-developed and well-nourished.  No distress.  HEENT: head atraumatic, normocephalic, pupils equal and reactive to light, neck supple Cardiovascular: Normal rate, regular rhythm and normal heart sounds.  No murmur heard. No BLE edema. Pulmonary/Chest: Effort normal and breath sounds normal. No respiratory distress. Abdominal: Soft.  There is no tenderness. Psychiatric: Patient has a normal mood and affect. behavior is normal. Judgment and thought content normal.    PHQ2/9:    04/27/2023    2:49 PM 03/12/2023    8:32 AM 10/25/2022    3:03 PM 05/08/2022   11:13 AM 04/26/2022    2:39 PM  Depression screen PHQ 2/9  Decreased Interest 0 0 0 0 0  Down, Depressed, Hopeless 0 0 0 0 0  PHQ - 2 Score 0 0 0 0 0  Altered sleeping 0 0 0 0 0  Tired, decreased energy 0 0 0 0 0  Change in appetite 0 0 0 0 0  Feeling bad or failure about yourself  0 0 0 0 0  Trouble concentrating 0 0 0 0 0  Moving slowly or fidgety/restless 0 0 0 0 0  Suicidal thoughts 0 0 0 0 0  PHQ-9 Score 0 0 0 0 0    phq 9 is negative   Fall Risk:    04/27/2023    2:49 PM 03/12/2023    8:32 AM 10/25/2022    3:03 PM 05/08/2022   11:13 AM 04/26/2022    2:39 PM  Fall Risk   Falls in the past year? 0 0 0 0 0  Number falls in past yr: 0 0     Injury with Fall?  0 0     Risk for fall due to : No Fall Risks No Fall Risks No Fall Risks No Fall Risks No Fall Risks  Follow up Falls prevention discussed Falls prevention discussed Falls prevention discussed;Education provided;Falls evaluation completed  Education provided;Falls prevention discussed      Functional Status Survey: Is the  patient deaf or have difficulty hearing?: No Does the patient have difficulty seeing, even when wearing glasses/contacts?: No Does the patient have difficulty concentrating, remembering, or making decisions?: No Does the patient have difficulty walking or climbing stairs?: No Does the patient have difficulty dressing or bathing?: No Does the patient have difficulty doing errands alone such as visiting a doctor's office or shopping?: No    Assessment & Plan  1. Major depression in remission [facility])  Doing well   2. Anxiety, generalized  - busPIRone (BUSPAR) 5 MG tablet; Take 1 tablet (5 mg total) by mouth 2 (two) times daily as needed.  Dispense: 60 tablet; Refill: 0  3. Vitamin D deficiency  - Vitamin D, Ergocalciferol, (DRISDOL) 1.25 MG (50000 UNIT) CAPS capsule; Take 1 capsule (50,000 Units total) by mouth once a week.  Dispense: 12 capsule; Refill: 0  4. Chronic tension-type headache, intractable  - butalbital-acetaminophen-caffeine (FIORICET) 50-325-40 MG tablet; Take 1 tablet by mouth every 6 (six) hours as needed for headache.  Dispense: 30 tablet; Refill: 0  5. Needs flu shot  - Flu vaccine trivalent PF, 6mos and older(Flulaval,Afluria,Fluarix,Fluzone)   6. Need for shingles vaccine  - Varicella-zoster vaccine IM

## 2023-04-27 ENCOUNTER — Ambulatory Visit (INDEPENDENT_AMBULATORY_CARE_PROVIDER_SITE_OTHER): Payer: BC Managed Care – PPO | Admitting: Family Medicine

## 2023-04-27 ENCOUNTER — Encounter: Payer: Self-pay | Admitting: Family Medicine

## 2023-04-27 VITALS — BP 120/68 | HR 78 | Temp 97.9°F | Resp 16 | Ht 64.0 in | Wt 145.0 lb

## 2023-04-27 DIAGNOSIS — E559 Vitamin D deficiency, unspecified: Secondary | ICD-10-CM

## 2023-04-27 DIAGNOSIS — Z23 Encounter for immunization: Secondary | ICD-10-CM | POA: Diagnosis not present

## 2023-04-27 DIAGNOSIS — G44221 Chronic tension-type headache, intractable: Secondary | ICD-10-CM

## 2023-04-27 DIAGNOSIS — F411 Generalized anxiety disorder: Secondary | ICD-10-CM

## 2023-04-27 DIAGNOSIS — F325 Major depressive disorder, single episode, in full remission: Secondary | ICD-10-CM

## 2023-04-27 MED ORDER — VITAMIN D (ERGOCALCIFEROL) 1.25 MG (50000 UNIT) PO CAPS: 50000.0000 [IU] | ORAL_CAPSULE | ORAL | 0 refills | Status: DC

## 2023-04-27 MED ORDER — BUTALBITAL-APAP-CAFFEINE 50-325-40 MG PO TABS
1.0000 | ORAL_TABLET | Freq: Four times a day (QID) | ORAL | 0 refills | Status: DC | PRN
Start: 2023-04-27 — End: 2023-09-18

## 2023-04-27 MED ORDER — BUSPIRONE HCL 5 MG PO TABS
5.0000 mg | ORAL_TABLET | Freq: Two times a day (BID) | ORAL | 0 refills | Status: DC | PRN
Start: 2023-04-27 — End: 2023-12-07

## 2023-04-27 NOTE — Patient Instructions (Signed)
[facility]

## 2023-04-30 ENCOUNTER — Telehealth: Payer: Self-pay | Admitting: Family Medicine

## 2023-04-30 NOTE — Telephone Encounter (Signed)
Advised patient 2nd shingles is 2 months from 1st. Patient verbalized understanding she isn't due until Nov.

## 2023-04-30 NOTE — Telephone Encounter (Signed)
Copied from CRM [id number]. Topic: Appointment Scheduling - Scheduling Inquiry for Clinic >> Apr 30, 2023 10:47 AM Everette C wrote: Reason for CRM: The patient would like to be contacted to discuss their second shingles vaccination and potentially schedule their second shingles vaccine appt   Please contact further when possible

## 2023-05-10 DIAGNOSIS — Z23 Encounter for immunization: Secondary | ICD-10-CM | POA: Diagnosis not present

## 2023-06-05 ENCOUNTER — Ambulatory Visit
Admission: RE | Admit: 2023-06-05 | Discharge: 2023-06-05 | Disposition: A | Discharge status: Home / Self Care | Payer: BC Managed Care – PPO | Source: Ambulatory Visit | Attending: Family Medicine | Admitting: Family Medicine

## 2023-06-05 DIAGNOSIS — E2839 Other primary ovarian failure: Secondary | ICD-10-CM | POA: Diagnosis not present

## 2023-06-05 DIAGNOSIS — Z1231 Encounter for screening mammogram for malignant neoplasm of breast: Secondary | ICD-10-CM | POA: Diagnosis not present

## 2023-06-05 DIAGNOSIS — Z13820 Encounter for screening for osteoporosis: Secondary | ICD-10-CM | POA: Insufficient documentation

## 2023-06-05 DIAGNOSIS — Z0000 Encounter for general adult medical examination without abnormal findings: Secondary | ICD-10-CM

## 2023-06-05 DIAGNOSIS — Z780 Asymptomatic menopausal state: Secondary | ICD-10-CM | POA: Diagnosis not present

## 2023-06-05 DIAGNOSIS — M85851 Other specified disorders of bone density and structure, right thigh: Secondary | ICD-10-CM | POA: Diagnosis not present

## 2023-09-18 ENCOUNTER — Other Ambulatory Visit: Payer: Self-pay | Admitting: Family Medicine

## 2023-09-18 DIAGNOSIS — I471 Supraventricular tachycardia, unspecified: Secondary | ICD-10-CM

## 2023-09-18 DIAGNOSIS — G44221 Chronic tension-type headache, intractable: Secondary | ICD-10-CM

## 2023-09-18 MED ORDER — BUTALBITAL-APAP-CAFFEINE 50-325-40 MG PO TABS: 1.0000 | ORAL_TABLET | Freq: Four times a day (QID) | ORAL | 0 refills | Status: DC | PRN

## 2023-09-18 MED ORDER — METOPROLOL TARTRATE 25 MG PO TABS: 12.5000 mg | ORAL_TABLET | Freq: Every day | ORAL | 0 refills | Status: DC | PRN

## 2023-09-18 NOTE — Telephone Encounter (Signed)
Last follow up on 04/27/23, citalopram not on current med list

## 2023-09-18 NOTE — Telephone Encounter (Signed)
Requesting refill on metoprolal, butalbital and citelopram.

## 2023-10-26 ENCOUNTER — Ambulatory Visit: Payer: Self-pay | Admitting: Family Medicine

## 2023-12-07 ENCOUNTER — Ambulatory Visit: Admitting: Family Medicine

## 2023-12-07 ENCOUNTER — Encounter: Payer: Self-pay | Admitting: Family Medicine

## 2023-12-07 VITALS — BP 122/76 | HR 59 | Resp 16 | Ht 64.0 in | Wt 136.2 lb

## 2023-12-07 DIAGNOSIS — K219 Gastro-esophageal reflux disease without esophagitis: Secondary | ICD-10-CM

## 2023-12-07 DIAGNOSIS — F411 Generalized anxiety disorder: Secondary | ICD-10-CM | POA: Diagnosis not present

## 2023-12-07 DIAGNOSIS — R002 Palpitations: Secondary | ICD-10-CM | POA: Insufficient documentation

## 2023-12-07 DIAGNOSIS — E559 Vitamin D deficiency, unspecified: Secondary | ICD-10-CM | POA: Diagnosis not present

## 2023-12-07 DIAGNOSIS — R7303 Prediabetes: Secondary | ICD-10-CM

## 2023-12-07 DIAGNOSIS — I471 Supraventricular tachycardia, unspecified: Secondary | ICD-10-CM

## 2023-12-07 DIAGNOSIS — G44209 Tension-type headache, unspecified, not intractable: Secondary | ICD-10-CM

## 2023-12-07 DIAGNOSIS — Z1211 Encounter for screening for malignant neoplasm of colon: Secondary | ICD-10-CM

## 2023-12-07 DIAGNOSIS — Z23 Encounter for immunization: Secondary | ICD-10-CM | POA: Diagnosis not present

## 2023-12-07 DIAGNOSIS — M5431 Sciatica, right side: Secondary | ICD-10-CM

## 2023-12-07 MED ORDER — BUSPIRONE HCL 5 MG PO TABS: 5.0000 mg | ORAL_TABLET | Freq: Two times a day (BID) | ORAL | 0 refills | Status: DC | PRN

## 2023-12-07 MED ORDER — VITAMIN D (ERGOCALCIFEROL) 1.25 MG (50000 UNIT) PO CAPS: 50000.0000 [IU] | ORAL_CAPSULE | ORAL | 1 refills | Status: DC

## 2023-12-07 MED ORDER — CELECOXIB 100 MG PO CAPS: 100.0000 mg | ORAL_CAPSULE | Freq: Two times a day (BID) | ORAL | 0 refills | Status: AC

## 2023-12-07 MED ORDER — BUTALBITAL-APAP-CAFFEINE 50-325-40 MG PO TABS: 1.0000 | ORAL_TABLET | Freq: Four times a day (QID) | ORAL | 1 refills | Status: DC | PRN

## 2023-12-07 NOTE — Progress Notes (Signed)
 Name: Chelsea Decker   MRN: [id number]    DOB: 10-Jan-1961   Date:12/07/2023       Progress Note  Subjective  Chief Complaint  Chief Complaint  Patient presents with   Medical Management of Chronic Issues   Discussed the use of AI scribe software for clinical note transcription with the patient, who gave verbal consent to proceed.  History of Present Illness Chelsea Decker is a 63 year old female who presents for a six-month follow-up visit.  She had her teeth extracted yesterday and is awaiting further dental work for partial dentures. The healing process is progressing well, and she is managing her diet by consuming soft foods like chicken noodle soup and crackers.  She has prediabetes with a last recorded A1c of 6.1. She is not on medication for this condition and is managing it through dietary changes, such as reducing intake of pasta, bread, and sweet beverages.  She experiences supraventricular tachycardia and takes metoprolol  as needed, approximately two to three times a week. She has palpitations but no chest pain or shortness of breath.  She has tension headaches about twice a week, which are effectively alleviated by butalbital . The pain is sharp and located in the temporal and frontal areas.  She has a history of anxiety and is currently taking buspirone  as needed. She previously took citalopram  but has since switched medications due to only taking medication prn   She reports chronic idiopathic constipation but manages well without taking Linzess  at work due to its effect - diarrhea .  She experiences occasional heartburn and manages it with Tums as needed.  She reports new onset low back pain with radiation down her leg for the past three days. The pain is sharp and shooting, affecting her sleep and work activities. No bowel or bladder incontinence.    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   History of shingles 04/17/2017   Post-menopause on HRT (hormone replacement therapy) 11/05/2015   Fasting hyperglycemia 08/10/2015   Anxiety, generalized 03/20/2015   Chronic tension-type headache, intractable 03/20/2015   Gastro-esophageal reflux disease without esophagitis 03/20/2015   IBS (irritable bowel syndrome) 03/20/2015   Menopause 03/20/2015   Vitamin D  deficiency 03/20/2015   Vasovagal syncope 08/18/2014   PSVT (paroxysmal supraventricular tachycardia) (HCC) 04/12/2012   External hemorrhoids 07/15/2007    Past Surgical History:  Procedure Laterality Date   ABDOMINAL HYSTERECTOMY     2013   BREAST BIOPSY Left 2013   core w/clip - neg   COLONOSCOPY WITH PROPOFOL  N/A 06/27/2018   Procedure: COLONOSCOPY WITH PROPOFOL ;  Surgeon: Selena Daily, MD;  Location: [facility];  Service: Gastroenterology;  Laterality: N/A;   LAPAROSCOPIC TOTAL HYSTERECTOMY      Family History  Problem Relation Age of Onset   Cancer Mother    Diabetes Mother    Cancer Father    Diabetes Father    Breast cancer Neg Hx     Social History   Tobacco Use   Smoking status: Never   Smokeless tobacco: Never  Substance Use Topics   Alcohol use: No    Alcohol/week: 0.0 standard drinks of alcohol     Current Outpatient Medications:    acetaminophen  (TYLENOL ) 500 MG chewable tablet, Chew 500 mg by mouth every 6 (six) hours as needed., Disp: , Rfl:    calcium carbonate (TUMS - DOSED IN MG ELEMENTAL CALCIUM)  500 MG chewable tablet, Chew 1 tablet by mouth daily., Disp: , Rfl:    celecoxib (CELEBREX) 100 MG capsule, Take 1 capsule (100 mg total) by mouth 2 (two) times daily., Disp: 60 capsule, Rfl: 0   linaclotide  (LINZESS ) 72 MCG capsule, Take 1 capsule (72 mcg total) by mouth daily., Disp: 30 capsule, Rfl: 2   metoprolol  tartrate (LOPRESSOR ) 25 MG tablet, Take 0.5 tablets (12.5 mg total) by mouth daily as needed. Palpitation, Disp: 30 tablet, Rfl: 0    busPIRone  (BUSPAR ) 5 MG tablet, Take 1 tablet (5 mg total) by mouth 2 (two) times daily as needed. For anxiety, Disp: 60 tablet, Rfl: 0   butalbital -acetaminophen -caffeine  (FIORICET) 50-325-40 MG tablet, Take 1 tablet by mouth every 6 (six) hours as needed for headache., Disp: 30 tablet, Rfl: 1   Vitamin D , Ergocalciferol , (DRISDOL ) 1.25 MG (50000 UNIT) CAPS capsule, Take 1 capsule (50,000 Units total) by mouth once a week., Disp: 12 capsule, Rfl: 1  No Known Allergies  I personally reviewed active problem list, medication list, allergies, family history with the patient/caregiver today.   ROS  Ten systems reviewed and is negative except as mentioned in HPI    Objective Physical Exam CONSTITUTIONAL: Patient appears well-developed and well-nourished. No distress. HEENT: Head atraumatic, normocephalic, neck supple. Gums well-healed. CARDIOVASCULAR: Normal rate, regular rhythm and normal heart sounds. No murmur heard. No BLE edema. PULMONARY: Effort normal and breath sounds normal. Lungs clear to auscultation bilaterally. No respiratory distress. ABDOMINAL: There is no tenderness or distention. MUSCULOSKELETAL: Normal gait. Without gross motor or sensory deficit. Back normal on examination. Right hip tenderness, possible trochanteric bursitis. PSYCHIATRIC: Patient has a normal mood and affect. Behavior is normal. Judgment and thought content normal.  Vitals:   12/07/23 0944  BP: 122/76  Pulse: (!) 59  Resp: 16  SpO2: 99%  Weight: 136 lb 3.2 oz (61.8 kg)  Height: 5\' 4"  (1.626 m)    Body mass index is 23.38 kg/m.    PHQ2/9:    12/07/2023    9:40 AM 04/27/2023    2:49 PM 03/12/2023    8:32 AM 10/25/2022    3:03 PM 05/08/2022   11:13 AM  Depression screen PHQ 2/9  Decreased Interest 0 0 0 0 0  Down, Depressed, Hopeless 0 0 0 0 0  PHQ - 2 Score 0 0 0 0 0  Altered sleeping 0 0 0 0 0  Tired, decreased energy 0 0 0 0 0  Change in appetite 0 0 0 0 0  Feeling bad or failure about  yourself  0 0 0 0 0  Trouble concentrating 0 0 0 0 0  Moving slowly or fidgety/restless 0 0 0 0 0  Suicidal thoughts 0 0 0 0 0  PHQ-9 Score 0 0 0 0 0  Difficult doing work/chores Not difficult at all        phq 9 is negative  Fall Risk:    04/27/2023    2:49 PM 03/12/2023    8:32 AM 10/25/2022    3:03 PM 05/08/2022   11:13 AM 04/26/2022    2:39 PM  Fall Risk   Falls in the past year? 0 0 0 0 0  Number falls in past yr: 0 0     Injury with Fall? 0 0     Risk for fall due to : No Fall Risks No Fall Risks No Fall Risks No Fall Risks No Fall Risks  Follow up Falls prevention discussed Falls prevention discussed Falls  prevention discussed;Education provided;Falls evaluation completed  Education provided;Falls prevention discussed     Assessment & Plan Sciatica New onset sciatica with sharp, shooting pain from buttocks down the leg for three days. No bowel or bladder incontinence. Pain not severe enough for prednisone . Discussed Celebrex for pain management. - Prescribe Celebrex twice daily for 3-4 days. - Advise to report if symptoms persist or worsen.  Trochanteric bursitis Pain and tenderness over lateral hip, likely trochanteric bursitis. Pain worsens when lying on affected side. Discussed potential corticosteroid injection if symptoms persist. - Advise stretching and icing. - Consider corticosteroid injection if symptoms persist.  Anxiety Long-standing anxiety managed with buspirone  as needed, with improvement. Discontinue citalopram . - Continue buspirone  as needed.  Tension-type headache Intermittent tension headaches twice a week, relieved by butalbital . No nausea. Encouraged hydration. - Continue butalbital  as needed. - Encourage hydration.  Prediabetes A1c at 6.1 indicates prediabetes. Discussed dietary modifications to manage blood sugar. - Encourage dietary modifications to reduce intake of pasta, bread, sweet beverages, and high-sugar foods.  Gastroesophageal reflux  disease (GERD) Occasional heartburn managed with Tums. No frequent or severe symptoms. - Continue Tums as needed.  Supraventricular tachycardia Paroxysmal supraventricular tachycardia managed with metoprolol  as needed. No recent increase in episodes. Continue current management. - Continue metoprolol  as needed.  Wellness Visit Routine six-month follow-up. Discussed dental health and upcoming procedures. Reviewed medical conditions and management. - Schedule physical exam when due. - Encourage participation in physical activities such as 5K runs.  General Health Maintenance Due for colon cancer screening and shingles vaccination. Discussed importance of screenings and vaccinations. Confirmed insurance coverage for shingles vaccine. - Refer for colonoscopy. - Administer second dose of shingles vaccine. - Prescribe vitamin D  50,000 IU weekly.  Follow-up Plan for follow-up in six months for routine evaluation and management of chronic conditions. - Schedule follow-up appointment in six months.

## 2024-03-13 ENCOUNTER — Encounter: Payer: Self-pay | Admitting: Family Medicine

## 2024-03-13 ENCOUNTER — Ambulatory Visit (INDEPENDENT_AMBULATORY_CARE_PROVIDER_SITE_OTHER): Payer: Self-pay | Admitting: Family Medicine

## 2024-03-13 VITALS — BP 114/74 | HR 61 | Resp 16 | Ht 65.0 in | Wt 138.3 lb

## 2024-03-13 DIAGNOSIS — E559 Vitamin D deficiency, unspecified: Secondary | ICD-10-CM

## 2024-03-13 DIAGNOSIS — Z0000 Encounter for general adult medical examination without abnormal findings: Secondary | ICD-10-CM

## 2024-03-13 DIAGNOSIS — I471 Supraventricular tachycardia, unspecified: Secondary | ICD-10-CM | POA: Diagnosis not present

## 2024-03-13 DIAGNOSIS — R7303 Prediabetes: Secondary | ICD-10-CM | POA: Diagnosis not present

## 2024-03-13 DIAGNOSIS — Z13220 Encounter for screening for lipoid disorders: Secondary | ICD-10-CM

## 2024-03-13 DIAGNOSIS — Z1231 Encounter for screening mammogram for malignant neoplasm of breast: Secondary | ICD-10-CM | POA: Diagnosis not present

## 2024-03-13 DIAGNOSIS — Z79899 Other long term (current) drug therapy: Secondary | ICD-10-CM

## 2024-03-13 DIAGNOSIS — N6322 Unspecified lump in the left breast, upper inner quadrant: Secondary | ICD-10-CM

## 2024-03-13 LAB — VITAMIN D 25 HYDROXY (VIT D DEFICIENCY, FRACTURES): Vit D, 25-Hydroxy: 87 ng/mL (ref 30–100)

## 2024-03-13 LAB — COMPREHENSIVE METABOLIC PANEL WITH GFR
AG Ratio: 1.4 (calc) (ref 1.0–2.5)
ALT: 10 U/L (ref 6–29)
AST: 13 U/L (ref 10–35)
Albumin: 4.4 g/dL (ref 3.6–5.1)
Alkaline phosphatase (APISO): 87 U/L (ref 37–153)
BUN: 12 mg/dL (ref 7–25)
CO2: 28 mmol/L (ref 20–32)
Calcium: 9.9 mg/dL (ref 8.6–10.4)
Chloride: 105 mmol/L (ref 98–110)
Creat: 0.77 mg/dL (ref 0.50–1.05)
Globulin: 3.1 g/dL (ref 1.9–3.7)
Glucose, Bld: 82 mg/dL (ref 65–99)
Potassium: 4.2 mmol/L (ref 3.5–5.3)
Sodium: 140 mmol/L (ref 135–146)
Total Bilirubin: 0.4 mg/dL (ref 0.2–1.2)
Total Protein: 7.5 g/dL (ref 6.1–8.1)
eGFR: 87 mL/min/1.73m2 (ref >=60)

## 2024-03-13 LAB — CBC WITH DIFFERENTIAL/PLATELET
Absolute Lymphocytes: 2257 {cells}/uL (ref 850–3900)
Absolute Monocytes: 418 {cells}/uL (ref 200–950)
Basophils Absolute: 53 {cells}/uL (ref 0–200)
Basophils Relative: 0.7 %
Eosinophils Absolute: 258 {cells}/uL (ref 15–500)
Eosinophils Relative: 3.4 %
HCT: 38.8 % (ref 35.0–45.0)
Hemoglobin: 12.4 g/dL (ref 11.7–15.5)
MCH: 29 pg (ref 27.0–33.0)
MCHC: 32 g/dL (ref 32.0–36.0)
MCV: 90.7 fL (ref 80.0–100.0)
MPV: 11.6 fL (ref 7.5–12.5)
Monocytes Relative: 5.5 %
Neutro Abs: 4613 {cells}/uL (ref 1500–7800)
Neutrophils Relative %: 60.7 %
Platelets: 200 Thousand/uL (ref 140–400)
RBC: 4.28 Million/uL (ref 3.80–5.10)
RDW: 13.8 % (ref 11.0–15.0)
Total Lymphocyte: 29.7 %
WBC: 7.6 Thousand/uL (ref 3.8–10.8)

## 2024-03-13 LAB — LIPID PANEL
Cholesterol: 202 mg/dL — ABNORMAL HIGH (ref <200)
HDL: 56 mg/dL (ref >=50)
LDL Cholesterol (Calc): 132 mg/dL — ABNORMAL HIGH
Non-HDL Cholesterol (Calc): 146 mg/dL — ABNORMAL HIGH (ref <130)
Total CHOL/HDL Ratio: 3.6 (calc) (ref <5.0)
Triglycerides: 58 mg/dL (ref <150)

## 2024-03-13 LAB — HEMOGLOBIN A1C
Hgb A1c MFr Bld: 6.1 % — ABNORMAL HIGH (ref <5.7)
Mean Plasma Glucose: 128 mg/dL
eAG (mmol/L): 7.1 mmol/L

## 2024-03-13 MED ORDER — METOPROLOL TARTRATE 25 MG PO TABS: 12.5000 mg | ORAL_TABLET | Freq: Every day | ORAL | 0 refills | Status: AC | PRN

## 2024-03-13 NOTE — Patient Instructions (Addendum)
 Go to [facility] and ask to speak to scheduler from Gastroenterologist Department to re-schedule your colonoscopy. Dr. Therisa is your provider   Preventive Care 56-63 Years Old, Female Preventive care refers to lifestyle choices and visits with your health care provider that can promote health and wellness. Preventive care visits are also called wellness exams. What can I expect for my preventive care visit? Counseling Your health care provider may ask you questions about your: Medical history, including: Past medical problems. Family medical history. Pregnancy history. Current health, including: Menstrual cycle. Method of birth control. Emotional well-being. Home life and relationship well-being. Sexual activity and sexual health. Lifestyle, including: Alcohol, nicotine or tobacco, and drug use. Access to firearms. Diet, exercise, and sleep habits. Work and work Astronomer. Sunscreen use. Safety issues such as seatbelt and bike helmet use. Physical exam Your health care provider will check your: Height and weight. These may be used to calculate your BMI (body mass index). BMI is a measurement that tells if you are at a healthy weight. Waist circumference. This measures the distance around your waistline. This measurement also tells if you are at a healthy weight and may help predict your risk of certain diseases, such as type 2 diabetes and high blood pressure. Heart rate and blood pressure. Body temperature. Skin for abnormal spots. What immunizations do I need?  Vaccines are usually given at various ages, according to a schedule. Your health care provider will recommend vaccines for you based on your age, medical history, and lifestyle or other factors, such as travel or where you work. What tests do I need? Screening Your health care provider may recommend screening tests for certain conditions. This may include: Lipid and cholesterol levels. Diabetes screening. This is  done by checking your blood sugar (glucose) after you have not eaten for a while (fasting). Pelvic exam and Pap test. Hepatitis B test. Hepatitis C test. HIV (human immunodeficiency virus) test. STI (sexually transmitted infection) testing, if you are at risk. Lung cancer screening. Colorectal cancer screening. Mammogram. Talk with your health care provider about when you should start having regular mammograms. This may depend on whether you have a family history of breast cancer. BRCA-related cancer screening. This may be done if you have a family history of breast, ovarian, tubal, or peritoneal cancers. Bone density scan. This is done to screen for osteoporosis. Talk with your health care provider about your test results, treatment options, and if necessary, the need for more tests. Follow these instructions at home: Eating and drinking  Eat a diet that includes fresh fruits and vegetables, whole grains, lean protein, and low-fat dairy products. Take vitamin and mineral supplements as recommended by your health care provider. Do not drink alcohol if: Your health care provider tells you not to drink. You are pregnant, may be pregnant, or are planning to become pregnant. If you drink alcohol: Limit how much you have to 0-1 drink a day. Know how much alcohol is in your drink. In the [place] one drink equals one 12 oz bottle of beer (355 mL), one 5 oz glass of wine (148 mL), or one 1 oz glass of hard liquor (44 mL). Lifestyle Brush your teeth every morning and night with fluoride toothpaste. Floss one time each day. Exercise for at least 30 minutes 5 or more days each week. Do not use any products that contain nicotine or tobacco. These products include cigarettes, chewing tobacco, and vaping devices, such as e-cigarettes. If you need help quitting, ask your  health care provider. Do not use drugs. If you are sexually active, practice safe sex. Use a condom or other form of protection to  prevent STIs. If you do not wish to become pregnant, use a form of birth control. If you plan to become pregnant, see your health care provider for a prepregnancy visit. Take aspirin  only as told by your health care provider. Make sure that you understand how much to take and what form to take. Work with your health care provider to find out whether it is safe and beneficial for you to take aspirin  daily. Find healthy ways to manage stress, such as: Meditation, yoga, or listening to music. Journaling. Talking to a trusted person. Spending time with friends and family. Minimize exposure to UV radiation to reduce your risk of skin cancer. Safety Always wear your seat belt while driving or riding in a vehicle. Do not drive: If you have been drinking alcohol. Do not ride with someone who has been drinking. When you are tired or distracted. While texting. If you have been using any mind-altering substances or drugs. Wear a helmet and other protective equipment during sports activities. If you have firearms in your house, make sure you follow all gun safety procedures. Seek help if you have been physically or sexually abused. What's next? Visit your health care provider once a year for an annual wellness visit. Ask your health care provider how often you should have your eyes and teeth checked. Stay up to date on all vaccines. This information is not intended to replace advice given to you by your health care provider. Make sure you discuss any questions you have with your health care provider. Document Revised: 01/26/2021 Document Reviewed: 01/26/2021 Elsevier Patient Education  2024 [employer].

## 2024-03-13 NOTE — Progress Notes (Signed)
 Name: Chelsea Decker   MRN: [id number]    DOB: [birth year]   Date:03/13/2024       Progress Note  Subjective  Chief Complaint  Chief Complaint  Patient presents with   Annual Exam    HPI  Patient presents for annual CPE.  Diet: cutting down on sugary drinks , cutting down on desserts, small portions Exercise:  continue regular activity  Last Eye Exam: encouraged to complete Last Dental Exam: encouraged to complete  Flowsheet Row Office Visit from 03/13/2024 in [facility]  AUDIT-C Score 0   Depression: Phq 9 is  negative    03/13/2024    9:09 AM 12/07/2023    9:40 AM 04/27/2023    2:49 PM 03/12/2023    8:32 AM 10/25/2022    3:03 PM  Depression screen PHQ 2/9  Decreased Interest 0 0 0 0 0  Down, Depressed, Hopeless 0 0 0 0 0  PHQ - 2 Score 0 0 0 0 0  Altered sleeping 0 0 0 0 0  Tired, decreased energy 0 0 0 0 0  Change in appetite 0 0 0 0 0  Feeling bad or failure about yourself  0 0 0 0 0  Trouble concentrating 0 0 0 0 0  Moving slowly or fidgety/restless 0 0 0 0 0  Suicidal thoughts 0 0 0 0 0  PHQ-9 Score 0 0 0 0 0  Difficult doing work/chores Not difficult at all Not difficult at all      Hypertension: BP Readings from Last 3 Encounters:  03/13/24 114/74  12/07/23 122/76  04/27/23 120/68   Obesity: Wt Readings from Last 3 Encounters:  03/13/24 138 lb 4.8 oz (62.7 kg)  12/07/23 136 lb 3.2 oz (61.8 kg)  04/27/23 145 lb (65.8 kg)   BMI Readings from Last 3 Encounters:  03/13/24 23.01 kg/m  12/07/23 23.38 kg/m  04/27/23 24.89 kg/m     Vaccines: reviewed with the patient.   Hep C Screening: completed STD testing and prevention (HIV/chl/gon/syphilis): N/A Intimate partner violence: negative screen  Sexual History :  partner had a MVA and unable to have intercourse  Menstrual History/LMP/Abnormal Bleeding: s/p hysterectomy  Discussed importance of follow up if any post-menopausal bleeding: not applicable  Incontinence  Symptoms: positive for mild stress incontinence  symptoms   Breast cancer:  - Last Mammogram: needs to schedule it  - BRCA gene screening: one sister has breast cancer   Osteoporosis Prevention : Discussed high calcium and vitamin D  supplementation, weight bearing exercises Bone density : discussed high calcium diet and vitamin D  supplementation, osteopenia but low FRAX score   Cervical cancer screening: not applicable due to hysterectomy  Skin cancer: Discussed monitoring for atypical lesions  Colorectal cancer: scheduled for August    Lung cancer:  Low Dose CT Chest recommended if Age 81-80 years, 20 pack-year currently smoking OR have quit w/in 15years. Patient does not qualify for screen   ECG: 2019  Advanced Care Planning: A voluntary discussion about advance care planning including the explanation and discussion of advance directives.  Discussed health care proxy and Living will, and the patient was able to identify a health care proxy as her daughter .  Patient does not have a living will and power of attorney of health care   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/13/2018   History of shingles 04/17/2017   Anxiety, generalized 03/20/2015  Gastro-esophageal reflux disease without esophagitis 03/20/2015   IBS (irritable bowel syndrome) 03/20/2015   Menopause 03/20/2015   Vitamin D  deficiency 03/20/2015   Vasovagal syncope 08/18/2014   PSVT (paroxysmal supraventricular tachycardia) (HCC) 04/12/2012   External hemorrhoids 07/15/2007    Past Surgical History:  Procedure Laterality Date   ABDOMINAL HYSTERECTOMY     2013   BREAST BIOPSY Left 2013   core w/clip - neg   COLONOSCOPY WITH PROPOFOL  N/A 06/27/2018   Procedure: COLONOSCOPY WITH PROPOFOL ;  Surgeon: Unk Corinn Skiff, MD;  Location: [facility] ENDOSCOPY;  Service: Gastroenterology;  Laterality: N/A;   LAPAROSCOPIC TOTAL HYSTERECTOMY      Family  History  Problem Relation Age of Onset   Cancer Mother    Diabetes Mother    Cancer Father    Diabetes Father    Breast cancer Neg Hx     Social History   Socioeconomic History   Marital status: Divorced    Spouse name: Not on file   Number of children: 2   Years of education: Not on file   Highest education level: High school graduate  Occupational History   Occupation: house keeping   Tobacco Use   Smoking status: Never   Smokeless tobacco: Never  Vaping Use   Vaping status: Never Used  Substance and Sexual Activity   Alcohol use: No    Alcohol/week: 0.0 standard drinks of alcohol   Drug use: No   Sexual activity: Not Currently    Partners: Male  Other Topics Concern   Not on file  Social History Narrative   Divorced   Dating [place] since 2015   Lost her job at [employer] and is now working at a nursing home at [employer]    Her youngest child - son, has disability since birth, MR    Social Drivers of Corporate investment banker Strain: Low Risk  (03/13/2024)   Overall Financial Resource Strain (CARDIA)    Difficulty of Paying Living Expenses: Not hard at all  Food Insecurity: No Food Insecurity (03/13/2024)   Hunger Vital Sign    Worried About Running Out of Food in the Last Year: Never true    Ran Out of Food in the Last Year: Never true  Transportation Needs: No Transportation Needs (03/13/2024)   PRAPARE - Administrator, Civil Service (Medical): No    Lack of Transportation (Non-Medical): No  Physical Activity: Sufficiently Active (03/13/2024)   Exercise Vital Sign    Days of Exercise per Week: 5 days    Minutes of Exercise per Session: 40 min  Stress: No Stress Concern Present (03/13/2024)   [employer] of Occupational Health - Occupational Stress Questionnaire    Feeling of Stress: Not at all  Social Connections: Moderately Isolated (03/13/2024)   Social Connection and Isolation Panel    Frequency of Communication with  Friends and Family: Once a week    Frequency of Social Gatherings with Friends and Family: Once a week    Attends Religious Services: More than 4 times per year    Active Member of [employer] or Organizations: No    Attends Engineer, structural: More than 4 times per year    Marital Status: Divorced  Intimate Partner Violence: Not At Risk (03/13/2024)   Humiliation, Afraid, Rape, and Kick questionnaire    Fear of Current or Ex-Partner: No    Emotionally Abused: No    Physically Abused: No    Sexually Abused: No  Current Outpatient Medications:    acetaminophen  (TYLENOL ) 500 MG chewable tablet, Chew 500 mg by mouth every 6 (six) hours as needed., Disp: , Rfl:    busPIRone  (BUSPAR ) 5 MG tablet, Take 1 tablet (5 mg total) by mouth 2 (two) times daily as needed. For anxiety, Disp: 60 tablet, Rfl: 0   butalbital -acetaminophen -caffeine  (FIORICET) 50-325-40 MG tablet, Take 1 tablet by mouth every 6 (six) hours as needed for headache., Disp: 30 tablet, Rfl: 1   celecoxib  (CELEBREX ) 100 MG capsule, Take 1 capsule (100 mg total) by mouth 2 (two) times daily., Disp: 60 capsule, Rfl: 0   linaclotide  (LINZESS ) 72 MCG capsule, Take 1 capsule (72 mcg total) by mouth daily., Disp: 30 capsule, Rfl: 2   Vitamin D , Ergocalciferol , (DRISDOL ) 1.25 MG (50000 UNIT) CAPS capsule, Take 1 capsule (50,000 Units total) by mouth once a week., Disp: 12 capsule, Rfl: 1   calcium carbonate (TUMS - DOSED IN MG ELEMENTAL CALCIUM) 500 MG chewable tablet, Chew 1 tablet by mouth daily. (Patient not taking: Reported on 03/13/2024), Disp: , Rfl:    metoprolol  tartrate (LOPRESSOR ) 25 MG tablet, Take 0.5 tablets (12.5 mg total) by mouth daily as needed. Palpitation, Disp: 30 tablet, Rfl: 0  No Known Allergies   ROS  Constitutional: Negative for fever or weight change.  Respiratory: Negative for cough and shortness of breath.   Cardiovascular: Negative for chest pain or palpitations.  Gastrointestinal: Negative for  abdominal pain, no bowel changes.  Musculoskeletal: Negative for gait problem or joint swelling.  Skin: Negative for rash.  Neurological: Negative for dizziness or headache.  No other specific complaints in a complete review of systems (except as listed in HPI above).   Objective  Vitals:   03/13/24 0913  BP: 114/74  Pulse: 61  Resp: 16  SpO2: 97%  Weight: 138 lb 4.8 oz (62.7 kg)  Height: 5' 5 (1.651 m)    Body mass index is 23.01 kg/m.  Physical Exam  Constitutional: Patient appears well-developed and well-nourished. No distress.  HENT: Head: Normocephalic and atraumatic. Ears: B TMs ok, no erythema or effusion; Nose: Nose normal. Mouth/Throat: Oropharynx is clear and moist, missing teeth, poor dentition . No oropharyngeal exudate.  Eyes: Conjunctivae and EOM are normal. Pupils are equal, round, and reactive to light. No scleral icterus.  Neck: Normal range of motion. Neck supple. No JVD present. No thyromegaly present.  Cardiovascular: Normal rate, regular rhythm and normal heart sounds.  No murmur heard. No BLE edema. Pulmonary/Chest: Effort normal and breath sounds normal. No respiratory distress. Abdominal: Soft. Bowel sounds are normal, no distension. There is no tenderness. no masses Breast: lump at 11 o'clock left breast  no nipple discharge or rashes FEMALE GENITALIA:  Not done  RECTAL: not done  Musculoskeletal: Normal range of motion, no joint effusions. No gross deformities Neurological: he is alert and oriented to person, place, and time. No cranial nerve deficit. Coordination, balance, strength, speech and gait are normal.  Skin: Skin is warm and dry. No rash noted. No erythema.  Psychiatric: Patient has a normal mood and affect. behavior is normal. Judgment and thought content normal.     Assessment & Plan  1. Well adult exam (Primary)  - MM 3D SCREENING MAMMOGRAM BILATERAL BREAST; Future - metoprolol  tartrate (LOPRESSOR ) 25 MG tablet; Take 0.5 tablets  (12.5 mg total) by mouth daily as needed. Palpitation  Dispense: 30 tablet; Refill: 0 - Lipid panel - CBC with Differential/Platelet - Comprehensive metabolic panel with GFR - VITAMIN D   25 Hydroxy (Vit-D Deficiency, Fractures) - Hemoglobin A1c  2. Encounter for screening mammogram for malignant neoplasm of breast  Needs to schedule   3. PSVT (paroxysmal supraventricular tachycardia) (HCC)  - metoprolol  tartrate (LOPRESSOR ) 25 MG tablet; Take 0.5 tablets (12.5 mg total) by mouth daily as needed. Palpitation  Dispense: 30 tablet; Refill: 0  4. Vitamin D  deficiency  - VITAMIN D  25 Hydroxy (Vit-D Deficiency, Fractures)  5. Pre-diabetes  - Hemoglobin A1c  6. Lipid screening  - Lipid panel  7. Long-term use of high-risk medication  - CBC with Differential/Platelet - Comprehensive metabolic panel with GFR   8. Breast lump on left side at 11 o'clock position  - MM 3D DIAGNOSTIC MAMMOGRAM BILATERAL BREAST; Future - US  LIMITED ULTRASOUND INCLUDING AXILLA LEFT BREAST ; Future - US  LIMITED ULTRASOUND INCLUDING AXILLA RIGHT BREAST; Future  -USPSTF grade A and B recommendations reviewed with patient; age-appropriate recommendations, preventive care, screening tests, etc discussed and encouraged; healthy living encouraged; see AVS for patient education given to patient -Discussed importance of 150 minutes of physical activity weekly, eat two servings of fish weekly, eat one serving of tree nuts ( cashews, pistachios, pecans, almonds.SABRA) every other day, eat 6 servings of fruit/vegetables daily and drink plenty of water and avoid sweet beverages.   -Reviewed Health Maintenance: Yes.

## 2024-03-14 ENCOUNTER — Ambulatory Visit: Payer: Self-pay | Admitting: Family Medicine

## 2024-03-14 ENCOUNTER — Telehealth: Payer: Self-pay

## 2024-03-14 NOTE — Telephone Encounter (Signed)
 Copied from CRM [id number]. Topic: Clinical - Lab/Test Results >> Mar 14, 2024  2:30 PM DeAngela L wrote: Reason for CRM: pt returned missed phone call read notes from the provider to the patient, patient had no questions at this time

## 2024-03-18 ENCOUNTER — Encounter

## 2024-03-18 ENCOUNTER — Other Ambulatory Visit

## 2024-03-19 ENCOUNTER — Ambulatory Visit
Admission: RE | Admit: 2024-03-19 | Discharge: 2024-03-19 | Disposition: A | Discharge status: Home / Self Care | Source: Ambulatory Visit | Attending: Family Medicine | Admitting: Family Medicine

## 2024-03-19 DIAGNOSIS — R92333 Mammographic heterogeneous density, bilateral breasts: Secondary | ICD-10-CM | POA: Diagnosis not present

## 2024-03-19 DIAGNOSIS — N6322 Unspecified lump in the left breast, upper inner quadrant: Secondary | ICD-10-CM | POA: Insufficient documentation

## 2024-03-19 DIAGNOSIS — N644 Mastodynia: Secondary | ICD-10-CM | POA: Diagnosis not present

## 2024-03-19 DIAGNOSIS — N6342 Unspecified lump in left breast, subareolar: Secondary | ICD-10-CM | POA: Diagnosis not present

## 2024-03-28 DIAGNOSIS — S0181XA Laceration without foreign body of other part of head, initial encounter: Secondary | ICD-10-CM | POA: Diagnosis not present

## 2024-04-01 ENCOUNTER — Ambulatory Visit: Admitting: Family Medicine

## 2024-04-01 ENCOUNTER — Encounter: Payer: Self-pay | Admitting: Family Medicine

## 2024-04-01 VITALS — BP 130/74 | HR 72 | Resp 16 | Ht 65.0 in | Wt 136.6 lb

## 2024-04-01 DIAGNOSIS — Z9181 History of falling: Secondary | ICD-10-CM | POA: Diagnosis not present

## 2024-04-01 DIAGNOSIS — S0993XD Unspecified injury of face, subsequent encounter: Secondary | ICD-10-CM | POA: Diagnosis not present

## 2024-04-01 NOTE — Progress Notes (Signed)
 Name: Chelsea Decker   MRN: [id number]    DOB: Dec 30, 1960   Date:04/01/2024       Progress Note  Subjective  Chief Complaint  Chief Complaint  Patient presents with   Fall    Seen at [facility] currently taking cephalexin, hit her eye    Discussed the use of AI scribe software for clinical note transcription with the patient, who gave verbal consent to proceed.  History of Present Illness Chelsea Decker is a 63 year old female who presents with facial bruising and swelling following a fall.  On August 15, she experienced a fall at her sister's house, resulting in facial bruising and swelling. She missed a step while descending into the living room, causing her to fall and hit her face on the crook of her arm, with her glasses smashing into her face. The fall occurred on a cement surface inside the house, which was under remodeling.  Following the fall, she visited an urgent care facility where she was prescribed Keflex, an antibiotic, to be taken four times a day for five days. However, she has been taking it twice a day. She reports no current pain, although she experienced significant bruising under her eye and swelling of her right lower  lip, which is now improving. She did not lose consciousness during the fall and was able to get up immediately. No neck pain or vision problems post-fall.  Her glasses were damaged in the fall, and she has since repaired them. She has been using ice to reduce swelling. She reports that the swelling and bruising have significantly decreased since the incident.    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/13/2018   History of shingles 04/17/2017   Anxiety, generalized 03/20/2015   Gastro-esophageal reflux disease without esophagitis 03/20/2015   IBS (irritable bowel syndrome) 03/20/2015   Menopause 03/20/2015   Vitamin D  deficiency 03/20/2015   Vasovagal syncope 08/18/2014   PSVT  (paroxysmal supraventricular tachycardia) (HCC) 04/12/2012   External hemorrhoids 07/15/2007    Social History   Tobacco Use   Smoking status: Never   Smokeless tobacco: Never  Substance Use Topics   Alcohol use: No    Alcohol/week: 0.0 standard drinks of alcohol     Current Outpatient Medications:    acetaminophen  (TYLENOL ) 500 MG chewable tablet, Chew 500 mg by mouth every 6 (six) hours as needed., Disp: , Rfl:    busPIRone  (BUSPAR ) 5 MG tablet, Take 1 tablet (5 mg total) by mouth 2 (two) times daily as needed. For anxiety, Disp: 60 tablet, Rfl: 0   butalbital -acetaminophen -caffeine  (FIORICET) 50-325-40 MG tablet, Take 1 tablet by mouth every 6 (six) hours as needed for headache., Disp: 30 tablet, Rfl: 1   celecoxib  (CELEBREX ) 100 MG capsule, Take 1 capsule (100 mg total) by mouth 2 (two) times daily., Disp: 60 capsule, Rfl: 0   cephALEXin (KEFLEX) 500 MG capsule, Take 500 mg by mouth 4 (four) times daily., Disp: , Rfl:    linaclotide  (LINZESS ) 72 MCG capsule, Take 1 capsule (72 mcg total) by mouth daily., Disp: 30 capsule, Rfl: 2   metoprolol  tartrate (LOPRESSOR ) 25 MG tablet, Take 0.5 tablets (12.5 mg total) by mouth daily as needed. Palpitation, Disp: 30 tablet, Rfl: 0   Vitamin D , Ergocalciferol , (DRISDOL ) 1.25 MG (50000 UNIT) CAPS capsule, Take 1 capsule (50,000 Units total) by mouth once a week., Disp: 12 capsule, Rfl: 1  No Known Allergies  ROS  Ten systems reviewed and is negative except as mentioned in HPI    Objective  Vitals:   04/01/24 1348  BP: 130/74  Pulse: 72  Resp: 16  SpO2: 100%  Weight: 136 lb 9.6 oz (62 kg)  Height: 5' 5 (1.651 m)    Body mass index is 22.73 kg/m.  Physical Exam CONSTITUTIONAL: Patient appears well-developed and well-nourished. No distress. HEENT: Head atraumatic, normocephalic, neck supple. Extraocular movements intact. No pain with eye movement. Bruising on face. Swelling of lips. CARDIOVASCULAR: Normal rate, regular rhythm  and normal heart sounds. No murmur heard. No BLE edema. PULMONARY: Effort normal and breath sounds normal. No respiratory distress. MUSCULOSKELETAL: Normal gait. Excoriation of right anterior knee PSYCHIATRIC: Patient has a normal mood and affect. Behavior is normal. Judgment and thought content normal.     Recent Results (from the past 2160 hours)  Lipid panel     Status: Abnormal   Collection Time: 03/13/24 10:01 AM  Result Value Ref Range   Cholesterol 202 (H) <200 mg/dL   HDL 56 > OR = 50 mg/dL   Triglycerides 58 <849 mg/dL   LDL Cholesterol (Calc) 132 (H) mg/dL (calc)    Comment: Reference range: <100 . Desirable range <100 mg/dL for primary prevention;   <70 mg/dL for patients with CHD or diabetic patients  with > or = 2 CHD risk factors. SABRA LDL-C is now calculated using the Martin-Hopkins  calculation, which is a validated novel method providing  better accuracy than the Friedewald equation in the  estimation of LDL-C.  Gladis APPLETHWAITE et al. SANDREA. 7986;689(80): 2061-2068  (http://education.[facility].com/faq/FAQ164)    Total CHOL/HDL Ratio 3.6 <5.0 (calc)   Non-HDL Cholesterol (Calc) 146 (H) <130 mg/dL (calc)    Comment: For patients with diabetes plus 1 major ASCVD risk  factor, treating to a non-HDL-C goal of <100 mg/dL  (LDL-C of <29 mg/dL) is considered a therapeutic  option.   CBC with Differential/Platelet     Status: None   Collection Time: 03/13/24 10:01 AM  Result Value Ref Range   WBC 7.6 3.8 - 10.8 Thousand/uL   RBC 4.28 3.80 - 5.10 Million/uL   Hemoglobin 12.4 11.7 - 15.5 g/dL   HCT 61.1 64.9 - 54.9 %   MCV 90.7 80.0 - 100.0 fL   MCH 29.0 27.0 - 33.0 pg   MCHC 32.0 32.0 - 36.0 g/dL    Comment: For adults, a slight decrease in the calculated MCHC value (in the range of 30 to 32 g/dL) is most likely not clinically significant; however, it should be interpreted with caution in correlation with other red cell parameters and the patient's  clinical condition.    RDW 13.8 11.0 - 15.0 %   Platelets 200 140 - 400 Thousand/uL   MPV 11.6 7.5 - 12.5 fL   Neutro Abs 4,613 1,500 - 7,800 cells/uL   Absolute Lymphocytes 2,257 850 - 3,900 cells/uL   Absolute Monocytes 418 200 - 950 cells/uL   Eosinophils Absolute 258 15 - 500 cells/uL   Basophils Absolute 53 0 - 200 cells/uL   Neutrophils Relative % 60.7 %   Total Lymphocyte 29.7 %   Monocytes Relative 5.5 %   Eosinophils Relative 3.4 %   Basophils Relative 0.7 %  Comprehensive metabolic panel with GFR     Status: None   Collection Time: 03/13/24 10:01 AM  Result Value Ref Range   Glucose, Bld 82 65 - 99 mg/dL    Comment: .  Fasting reference interval .    BUN 12 7 - 25 mg/dL   Creat 9.22 9.49 - 8.94 mg/dL   eGFR 87 > OR = 60 fO/fpw/8.26f7   BUN/Creatinine Ratio SEE NOTE: 6 - 22 (calc)    Comment:    Not Reported: BUN and Creatinine are within    reference range. .    Sodium 140 135 - 146 mmol/L   Potassium 4.2 3.5 - 5.3 mmol/L   Chloride 105 98 - 110 mmol/L   CO2 28 20 - 32 mmol/L   Calcium 9.9 8.6 - 10.4 mg/dL   Total Protein 7.5 6.1 - 8.1 g/dL   Albumin 4.4 3.6 - 5.1 g/dL   Globulin 3.1 1.9 - 3.7 g/dL (calc)   AG Ratio 1.4 1.0 - 2.5 (calc)   Total Bilirubin 0.4 0.2 - 1.2 mg/dL   Alkaline phosphatase (APISO) 87 37 - 153 U/L   AST 13 10 - 35 U/L   ALT 10 6 - 29 U/L  VITAMIN D  25 Hydroxy (Vit-D Deficiency, Fractures)     Status: None   Collection Time: 03/13/24 10:01 AM  Result Value Ref Range   Vit D, 25-Hydroxy 87 30 - 100 ng/mL    Comment: Vitamin D  Status         25-OH Vitamin D : . Deficiency:                    <20 ng/mL Insufficiency:             20 - 29 ng/mL Optimal:                 > or = 30 ng/mL . For 25-OH Vitamin D  testing on patients on  D2-supplementation and patients for whom quantitation  of D2 and D3 fractions is required, the [facility](TM) 25-OH VIT D, (D2,D3), LC/MS/MS is recommended: order  code 07111 (patients  >67yrs). . See Note 1 . Note 1 . For additional information, please refer to  http://education.[facility].com/faq/FAQ199  (This link is being provided for informational/ educational purposes only.)   Hemoglobin A1c     Status: Abnormal   Collection Time: 03/13/24 10:01 AM  Result Value Ref Range   Hgb A1c MFr Bld 6.1 (H) <5.7 %    Comment: For someone without known diabetes, a hemoglobin  A1c value between 5.7% and 6.4% is consistent with prediabetes and should be confirmed with a  follow-up test. . For someone with known diabetes, a value <7% indicates that their diabetes is well controlled. A1c targets should be individualized based on duration of diabetes, age, comorbid conditions, and other considerations. . This assay result is consistent with an increased risk of diabetes. . Currently, no consensus exists regarding use of hemoglobin A1c for diagnosis of diabetes for children. .    Mean Plasma Glucose 128 mg/dL   eAG (mmol/L) 7.1 mmol/L     Assessment & Plan Facial contusion and swelling after fall Significant improvement in swelling and bruising. No infection or fracture. Imaging unnecessary. - Discontinue Keflex after fifth day of treatment. - Advised caution to prevent future falls.

## 2024-04-23 ENCOUNTER — Ambulatory Visit (INDEPENDENT_AMBULATORY_CARE_PROVIDER_SITE_OTHER): Admitting: Family Medicine

## 2024-04-23 ENCOUNTER — Encounter: Payer: Self-pay | Admitting: Family Medicine

## 2024-04-23 VITALS — BP 114/70 | HR 77 | Resp 16 | Ht 65.0 in | Wt 138.8 lb

## 2024-04-23 DIAGNOSIS — G44221 Chronic tension-type headache, intractable: Secondary | ICD-10-CM

## 2024-04-23 DIAGNOSIS — R051 Acute cough: Secondary | ICD-10-CM | POA: Diagnosis not present

## 2024-04-23 LAB — POC COVID19/FLU A&B COMBO
Covid Antigen, POC: NEGATIVE
Influenza A Antigen, POC: NEGATIVE
Influenza B Antigen, POC: NEGATIVE

## 2024-04-23 MED ORDER — BENZONATATE 100 MG PO CAPS: 100.0000 mg | ORAL_CAPSULE | Freq: Three times a day (TID) | ORAL | 0 refills | Status: DC | PRN

## 2024-04-23 NOTE — Progress Notes (Signed)
 Name: Chelsea Decker   MRN: [id number]    DOB: 01-Jun-1961   Date:04/23/2024       Progress Note  Subjective  Chief Complaint  Chief Complaint  Patient presents with   Headache   Cough    X 1 week    Discussed the use of AI scribe software for clinical note transcription with the patient, who gave verbal consent to proceed.  History of Present Illness Chelsea Decker is a 63 year old female who presents with a persistent cough.  She has been experiencing a cough for approximately one week. The cough is intermittent, occurring sporadically rather than daily. Initially, it was productive with some phlegm, particularly noted on Monday of the previous week. Over the past two days, the cough has improved.  No associated symptoms such as rhinorrhea, congestion, dyspnea, wheezing, or fever. She experiences headaches, which are managed with Fioricet, effectively alleviating them when they occur. She also has allergies, which are currently managed with allergy medication.  For the cough, she has been using over-the-counter remedies, specifically Nyquil at night and cough drops during the day, which she finds helpful in reducing the frequency of her cough.    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/13/2018   History of shingles 04/17/2017   Anxiety, generalized 03/20/2015   Gastro-esophageal reflux disease without esophagitis 03/20/2015   IBS (irritable bowel syndrome) 03/20/2015   Menopause 03/20/2015   Vitamin D  deficiency 03/20/2015   Vasovagal syncope 08/18/2014   PSVT (paroxysmal supraventricular tachycardia) (HCC) 04/12/2012   External hemorrhoids 07/15/2007    Social History   Tobacco Use   Smoking status: Never   Smokeless tobacco: Never  Substance Use Topics   Alcohol use: No    Alcohol/week: 0.0 standard drinks of alcohol     Current Outpatient Medications:    acetaminophen  (TYLENOL ) 500 MG  chewable tablet, Chew 500 mg by mouth every 6 (six) hours as needed., Disp: , Rfl:    busPIRone  (BUSPAR ) 5 MG tablet, Take 1 tablet (5 mg total) by mouth 2 (two) times daily as needed. For anxiety, Disp: 60 tablet, Rfl: 0   butalbital -acetaminophen -caffeine  (FIORICET) 50-325-40 MG tablet, Take 1 tablet by mouth every 6 (six) hours as needed for headache., Disp: 30 tablet, Rfl: 1   celecoxib  (CELEBREX ) 100 MG capsule, Take 1 capsule (100 mg total) by mouth 2 (two) times daily., Disp: 60 capsule, Rfl: 0   cephALEXin (KEFLEX) 500 MG capsule, Take 500 mg by mouth 4 (four) times daily., Disp: , Rfl:    linaclotide  (LINZESS ) 72 MCG capsule, Take 1 capsule (72 mcg total) by mouth daily., Disp: 30 capsule, Rfl: 2   metoprolol  tartrate (LOPRESSOR ) 25 MG tablet, Take 0.5 tablets (12.5 mg total) by mouth daily as needed. Palpitation, Disp: 30 tablet, Rfl: 0   Vitamin D , Ergocalciferol , (DRISDOL ) 1.25 MG (50000 UNIT) CAPS capsule, Take 1 capsule (50,000 Units total) by mouth once a week., Disp: 12 capsule, Rfl: 1  No Known Allergies  ROS  Ten systems reviewed and is negative except as mentioned in HPI    Objective  Vitals:   04/23/24 1130  BP: 114/70  Pulse: 77  Resp: 16  SpO2: 99%  Weight: 138 lb 12.8 oz (63 kg)  Height: 5' 5 (1.651 m)    Body mass index is 23.1 kg/m.    Physical Exam CONSTITUTIONAL: Patient appears well-developed and well-nourished. No distress. HEENT: Head atraumatic, normocephalic,  neck supple. Throat normal. Ears normal. CARDIOVASCULAR: Normal rate, regular rhythm and normal heart sounds. No murmur heard. No BLE edema. PULMONARY: Effort normal and breath sounds normal. No respiratory distress. Lungs clear to auscultation, no wheeze. MUSCULOSKELETAL: Normal gait. Without gross motor or sensory deficit. PSYCHIATRIC: Patient has a normal mood and affect. Behavior is normal. Judgment and thought content normal.  Recent Results (from the past 2160 hours)  Lipid panel      Status: Abnormal   Collection Time: 03/13/24 10:01 AM  Result Value Ref Range   Cholesterol 202 (H) <200 mg/dL   HDL 56 > OR = 50 mg/dL   Triglycerides 58 <849 mg/dL   LDL Cholesterol (Calc) 132 (H) mg/dL (calc)    Comment: Reference range: <100 . Desirable range <100 mg/dL for primary prevention;   <70 mg/dL for patients with CHD or diabetic patients  with > or = 2 CHD risk factors. SABRA LDL-C is now calculated using the Martin-Hopkins  calculation, which is a validated novel method providing  better accuracy than the Friedewald equation in the  estimation of LDL-C.  Gladis APPLETHWAITE et al. SANDREA. 7986;689(80): 2061-2068  (http://education.[facility].com/faq/FAQ164)    Total CHOL/HDL Ratio 3.6 <5.0 (calc)   Non-HDL Cholesterol (Calc) 146 (H) <130 mg/dL (calc)    Comment: For patients with diabetes plus 1 major ASCVD risk  factor, treating to a non-HDL-C goal of <100 mg/dL  (LDL-C of <29 mg/dL) is considered a therapeutic  option.   CBC with Differential/Platelet     Status: None   Collection Time: 03/13/24 10:01 AM  Result Value Ref Range   WBC 7.6 3.8 - 10.8 Thousand/uL   RBC 4.28 3.80 - 5.10 Million/uL   Hemoglobin 12.4 11.7 - 15.5 g/dL   HCT 61.1 64.9 - 54.9 %   MCV 90.7 80.0 - 100.0 fL   MCH 29.0 27.0 - 33.0 pg   MCHC 32.0 32.0 - 36.0 g/dL    Comment: For adults, a slight decrease in the calculated MCHC value (in the range of 30 to 32 g/dL) is most likely not clinically significant; however, it should be interpreted with caution in correlation with other red cell parameters and the patient's clinical condition.    RDW 13.8 11.0 - 15.0 %   Platelets 200 140 - 400 Thousand/uL   MPV 11.6 7.5 - 12.5 fL   Neutro Abs 4,613 1,500 - 7,800 cells/uL   Absolute Lymphocytes 2,257 850 - 3,900 cells/uL   Absolute Monocytes 418 200 - 950 cells/uL   Eosinophils Absolute 258 15 - 500 cells/uL   Basophils Absolute 53 0 - 200 cells/uL   Neutrophils Relative % 60.7 %   Total  Lymphocyte 29.7 %   Monocytes Relative 5.5 %   Eosinophils Relative 3.4 %   Basophils Relative 0.7 %  Comprehensive metabolic panel with GFR     Status: None   Collection Time: 03/13/24 10:01 AM  Result Value Ref Range   Glucose, Bld 82 65 - 99 mg/dL    Comment: .            Fasting reference interval .    BUN 12 7 - 25 mg/dL   Creat 9.22 9.49 - 8.94 mg/dL   eGFR 87 > OR = 60 fO/fpw/8.26f7   BUN/Creatinine Ratio SEE NOTE: 6 - 22 (calc)    Comment:    Not Reported: BUN and Creatinine are within    reference range. .    Sodium 140 135 - 146 mmol/L   Potassium  4.2 3.5 - 5.3 mmol/L   Chloride 105 98 - 110 mmol/L   CO2 28 20 - 32 mmol/L   Calcium 9.9 8.6 - 10.4 mg/dL   Total Protein 7.5 6.1 - 8.1 g/dL   Albumin 4.4 3.6 - 5.1 g/dL   Globulin 3.1 1.9 - 3.7 g/dL (calc)   AG Ratio 1.4 1.0 - 2.5 (calc)   Total Bilirubin 0.4 0.2 - 1.2 mg/dL   Alkaline phosphatase (APISO) 87 37 - 153 U/L   AST 13 10 - 35 U/L   ALT 10 6 - 29 U/L  VITAMIN D  25 Hydroxy (Vit-D Deficiency, Fractures)     Status: None   Collection Time: 03/13/24 10:01 AM  Result Value Ref Range   Vit D, 25-Hydroxy 87 30 - 100 ng/mL    Comment: Vitamin D  Status         25-OH Vitamin D : . Deficiency:                    <20 ng/mL Insufficiency:             20 - 29 ng/mL Optimal:                 > or = 30 ng/mL . For 25-OH Vitamin D  testing on patients on  D2-supplementation and patients for whom quantitation  of D2 and D3 fractions is required, the [facility](TM) 25-OH VIT D, (D2,D3), LC/MS/MS is recommended: order  code 07111 (patients >66yrs). . See Note 1 . Note 1 . For additional information, please refer to  http://education.[facility].com/faq/FAQ199  (This link is being provided for informational/ educational purposes only.)   Hemoglobin A1c     Status: Abnormal   Collection Time: 03/13/24 10:01 AM  Result Value Ref Range   Hgb A1c MFr Bld 6.1 (H) <5.7 %    Comment: For someone without known  diabetes, a hemoglobin  A1c value between 5.7% and 6.4% is consistent with prediabetes and should be confirmed with a  follow-up test. . For someone with known diabetes, a value <7% indicates that their diabetes is well controlled. A1c targets should be individualized based on duration of diabetes, age, comorbid conditions, and other considerations. . This assay result is consistent with an increased risk of diabetes. . Currently, no consensus exists regarding use of hemoglobin A1c for diagnosis of diabetes for children. .    Mean Plasma Glucose 128 mg/dL   eAG (mmol/L) 7.1 mmol/L  POC Covid19/Flu A&B Antigen     Status: None   Collection Time: 04/23/24 11:32 AM  Result Value Ref Range   Influenza A Antigen, POC Negative Negative   Influenza B Antigen, POC Negative Negative   Covid Antigen, POC Negative Negative     Assessment & Plan Acute cough Intermittent cough for one week, improving. Likely due to allergies or mild viral infection. Negative COVID and flu tests. - Prescribed Tessalon  Perles for symptomatic relief as needed. - Consider Singulair if symptoms persist.  Headache/Tension type  Intermittent headaches managed effectively with Fioricet. - Continue Fioricet as needed.

## 2024-05-12 DIAGNOSIS — Z1211 Encounter for screening for malignant neoplasm of colon: Secondary | ICD-10-CM | POA: Diagnosis not present

## 2024-05-20 ENCOUNTER — Encounter: Payer: Self-pay | Admitting: Gastroenterology

## 2024-05-21 ENCOUNTER — Encounter: Payer: Self-pay | Admitting: Gastroenterology

## 2024-05-21 NOTE — Anesthesia Preprocedure Evaluation (Signed)
 Anesthesia Evaluation  Patient identified by MRN, date of birth, ID band Patient awake    Reviewed: Allergy & Precautions, H&P , NPO status , Patient's Chart, lab work & pertinent test results  Airway Mallampati: II  TM Distance: >3 FB Neck ROM: Full    Dental no notable dental hx. (+) Loose, Poor Dentition Multiple missing teeth, all lower teeth loose and patient has plans to have them pulled and get dentures:   Pulmonary neg pulmonary ROS, asthma    Pulmonary exam normal breath sounds clear to auscultation       Cardiovascular negative cardio ROS Normal cardiovascular exam+ Valvular Problems/Murmurs  Rhythm:Regular Rate:Normal     Neuro/Psych  Headaches PSYCHIATRIC DISORDERS Anxiety Depression    negative neurological ROS  negative psych ROS   GI/Hepatic negative GI ROS, Neg liver ROS,GERD  ,,  Endo/Other  negative endocrine ROS    Renal/GU negative Renal ROS  negative genitourinary   Musculoskeletal negative musculoskeletal ROS (+)    Abdominal   Peds negative pediatric ROS (+)  Hematology negative hematology ROS (+) Blood dyscrasia, anemia   Anesthesia Other Findings Medical History  Unspecified vitamin D  deficiency External hemorrhoids without mention of complication Lump or mass in breast  Palpitations PSVT (paroxysmal supraventricular tachycardia)  IBS (irritable bowel syndrome) Anxiety  Heart palpitations GERD (gastroesophageal reflux disease) Asthma Patellar sleeve fracture of left knee  Heart murmur Pre-diabetes  Anemia Headache  Vasovagal syncope Mild major depression  Anxiety, generalized Chronic tension type headache   Takes beta blockers 2 or 3 x/week for SVT  Reproductive/Obstetrics negative OB ROS                              Anesthesia Physical Anesthesia Plan  ASA: 3  Anesthesia Plan: General   Post-op Pain Management:    Induction:  Intravenous  PONV Risk Score and Plan:   Airway Management Planned: Natural Airway and Nasal Cannula  Additional Equipment:   Intra-op Plan:   Post-operative Plan:   Informed Consent: I have reviewed the patients History and Physical, chart, labs and discussed the procedure including the risks, benefits and alternatives for the proposed anesthesia with the patient or authorized representative who has indicated his/her understanding and acceptance.     Dental Advisory Given  Plan Discussed with: Anesthesiologist, CRNA and Surgeon  Anesthesia Plan Comments: (Patient consented for risks of anesthesia including but not limited to:  - adverse reactions to medications - risk of airway placement if required - damage to eyes, teeth, lips or other oral mucosa - nerve damage due to positioning  - sore throat or hoarseness - Damage to heart, brain, nerves, lungs, other parts of body or loss of life  Patient voiced understanding and assent.)        Anesthesia Quick Evaluation

## 2024-05-23 ENCOUNTER — Ambulatory Visit: Payer: Self-pay | Admitting: Anesthesiology

## 2024-05-23 ENCOUNTER — Encounter
Admission: RE | Disposition: A | Discharge status: Home / Self Care | Payer: Self-pay | Source: Home / Self Care | Attending: Gastroenterology

## 2024-05-23 ENCOUNTER — Other Ambulatory Visit: Payer: Self-pay | Admitting: Family Medicine

## 2024-05-23 ENCOUNTER — Encounter: Payer: Self-pay | Admitting: Gastroenterology

## 2024-05-23 ENCOUNTER — Other Ambulatory Visit: Payer: Self-pay

## 2024-05-23 ENCOUNTER — Ambulatory Visit
Admission: RE | Admit: 2024-05-23 | Discharge: 2024-05-23 | Disposition: A | Discharge status: Home / Self Care | Attending: Gastroenterology | Admitting: Gastroenterology

## 2024-05-23 DIAGNOSIS — I4719 Other supraventricular tachycardia: Secondary | ICD-10-CM | POA: Diagnosis not present

## 2024-05-23 DIAGNOSIS — F411 Generalized anxiety disorder: Secondary | ICD-10-CM | POA: Insufficient documentation

## 2024-05-23 DIAGNOSIS — F320 Major depressive disorder, single episode, mild: Secondary | ICD-10-CM | POA: Insufficient documentation

## 2024-05-23 DIAGNOSIS — Z1211 Encounter for screening for malignant neoplasm of colon: Secondary | ICD-10-CM | POA: Diagnosis not present

## 2024-05-23 DIAGNOSIS — Z79899 Other long term (current) drug therapy: Secondary | ICD-10-CM | POA: Insufficient documentation

## 2024-05-23 DIAGNOSIS — F419 Anxiety disorder, unspecified: Secondary | ICD-10-CM | POA: Diagnosis not present

## 2024-05-23 DIAGNOSIS — I4891 Unspecified atrial fibrillation: Secondary | ICD-10-CM | POA: Diagnosis not present

## 2024-05-23 DIAGNOSIS — I480 Paroxysmal atrial fibrillation: Secondary | ICD-10-CM

## 2024-05-23 DIAGNOSIS — J45909 Unspecified asthma, uncomplicated: Secondary | ICD-10-CM | POA: Diagnosis not present

## 2024-05-23 HISTORY — DX: Prediabetes: R73.03

## 2024-05-23 HISTORY — DX: Anemia, unspecified: D64.9

## 2024-05-23 HISTORY — DX: Cardiac murmur, unspecified: R01.1

## 2024-05-23 HISTORY — DX: Headache, unspecified: R51.9

## 2024-05-23 HISTORY — DX: Generalized anxiety disorder: F41.1

## 2024-05-23 HISTORY — DX: Syncope and collapse: R55

## 2024-05-23 HISTORY — DX: Major depressive disorder, single episode, mild: F32.0

## 2024-05-23 HISTORY — PX: COLONOSCOPY: SHX5424

## 2024-05-23 HISTORY — DX: Chronic tension-type headache, not intractable: G44.229

## 2024-05-23 SURGERY — COLONOSCOPY
Anesthesia: General | Site: Rectum

## 2024-05-23 MED ORDER — PROPOFOL 10 MG/ML IV BOLUS
INTRAVENOUS | Status: DC | PRN
  Administered 2024-05-23: 20 mg via INTRAVENOUS
  Administered 2024-05-23: 160 ug/kg/min via INTRAVENOUS

## 2024-05-23 MED ORDER — SODIUM CHLORIDE 0.9 % IV SOLN: INTRAVENOUS | Status: DC

## 2024-05-23 MED ORDER — STERILE WATER FOR IRRIGATION IR SOLN
Status: DC | PRN
  Administered 2024-05-23: 500 mL

## 2024-05-23 MED ORDER — LACTATED RINGERS IV SOLN: INTRAVENOUS | Status: DC

## 2024-05-23 SURGICAL SUPPLY — 16 items

## 2024-05-23 NOTE — Transfer of Care (Signed)
 Immediate Anesthesia Transfer of Care Note  Patient: Chelsea Decker  Procedure(s) Performed: COLONOSCOPY (Rectum)  Patient Location: PACU  Anesthesia Type: General  Level of Consciousness: awake, alert  and patient cooperative  Airway and Oxygen Therapy: Patient Spontanous Breathing   Post-op Assessment: Post-op Vital signs reviewed, Patient's Cardiovascular Status Stable, Respiratory Function Stable, Patent Airway and No signs of Nausea or vomiting  Post-op Vital Signs: Reviewed and stable  Complications: No notable events documented.

## 2024-05-23 NOTE — H&P (Signed)
 Chelsea JONELLE Brooklyn, MD [facility], DHIP [address]  [place], [place] [address]  Main: [phone] Fax:  [phone] Pager: [phone]   Primary Care Physician:  Glenard Mire, MD Primary Gastroenterologist:  Dr. Corinn JONELLE Decker  Pre-Procedure History & Physical: HPI:  Chelsea Decker is a 63 y.o. female is here for an colonoscopy.   Past Medical History:  Diagnosis Date   Anemia    Anxiety    Anxiety, generalized    Asthma    Chronic tension type headache    External hemorrhoids without mention of complication    GERD (gastroesophageal reflux disease)    Headache    migrains   Heart murmur    irregular heartbeat   Heart palpitations    IBS (irritable bowel syndrome)    Lump or mass in breast    Mild major depression    Palpitations    Patellar sleeve fracture of left knee 01/03/2018   Pre-diabetes    watching diet   PSVT (paroxysmal supraventricular tachycardia) 04/12/2012   Unspecified vitamin D  deficiency    Vasovagal syncope     Past Surgical History:  Procedure Laterality Date   ABDOMINAL HYSTERECTOMY     2013   BREAST BIOPSY Left 2013   core w/clip - neg   COLONOSCOPY WITH PROPOFOL  N/A 06/27/2018   Procedure: COLONOSCOPY WITH PROPOFOL ;  Surgeon: Decker Chelsea Skiff, MD;  Location: [facility] ENDOSCOPY;  Service: Gastroenterology;  Laterality: N/A;   LAPAROSCOPIC TOTAL HYSTERECTOMY      Prior to Admission medications   Medication Sig Start Date End Date Taking? Authorizing Provider  acetaminophen  (TYLENOL ) 500 MG chewable tablet Chew 500 mg by mouth every 6 (six) hours as needed.   Yes [provider]  benzonatate  (TESSALON ) 100 MG capsule Take 1-2 capsules (100-200 mg total) by mouth 3 (three) times daily as needed for cough. 04/23/24  Yes Sowles, Krichna, MD  busPIRone  (BUSPAR ) 5 MG tablet Take 1 tablet (5 mg total) by mouth 2 (two) times daily as needed. For anxiety 12/07/23  Yes Sowles, Krichna, MD   butalbital -acetaminophen -caffeine  (FIORICET) 50-325-40 MG tablet Take 1 tablet by mouth every 6 (six) hours as needed for headache. 12/07/23  Yes Glenard, Krichna, MD  celecoxib  (CELEBREX ) 100 MG capsule Take 1 capsule (100 mg total) by mouth 2 (two) times daily. 12/07/23  Yes Sowles, Krichna, MD  cephALEXin (KEFLEX) 500 MG capsule Take 500 mg by mouth 4 (four) times daily. Patient taking differently: Take 500 mg by mouth 4 (four) times daily. PRN 03/28/24  Yes [provider]  linaclotide  (LINZESS ) 72 MCG capsule Take 1 capsule (72 mcg total) by mouth daily. 11/24/16  Yes Sowles, Krichna, MD  metoprolol  tartrate (LOPRESSOR ) 25 MG tablet Take 0.5 tablets (12.5 mg total) by mouth daily as needed. Palpitation 03/13/24  Yes Sowles, Krichna, MD  Vitamin D , Ergocalciferol , (DRISDOL ) 1.25 MG (50000 UNIT) CAPS capsule Take 1 capsule (50,000 Units total) by mouth once a week. 12/07/23  Yes Sowles, Krichna, MD    Allergies as of 05/12/2024   (No Known Allergies)    Family History  Problem Relation Age of Onset   Cancer Mother    Diabetes Mother    Cancer Father    Diabetes Father    Breast cancer Sister     Social History   Socioeconomic History   Marital status: Divorced    Spouse name: Not on file   Number of children: 2   Years of education: Not on file  Highest education level: Some college, no degree  Occupational History   Occupation: house keeping   Tobacco Use   Smoking status: Never   Smokeless tobacco: Never  Vaping Use   Vaping status: Never Used  Substance and Sexual Activity   Alcohol use: No    Alcohol/week: 0.0 standard drinks of alcohol   Drug use: Never   Sexual activity: Not Currently    Partners: Male  Other Topics Concern   Not on file  Social History Narrative   Divorced   Dating [place] since 2015   Lost her job at [employer] and is now working at a nursing home at [employer]    Her youngest child - son, has disability since birth, MR     Social Drivers of Health   Financial Resource Strain: Patient Declined (03/31/2024)   Overall Financial Resource Strain (CARDIA)    Difficulty of Paying Living Expenses: Patient declined  Food Insecurity: Patient Declined (03/31/2024)   Hunger Vital Sign    Worried About Running Out of Food in the Last Year: Patient declined    Ran Out of Food in the Last Year: Patient declined  Transportation Needs: No Transportation Needs (03/31/2024)   PRAPARE - Administrator, Civil Service (Medical): No    Lack of Transportation (Non-Medical): No  Physical Activity: Sufficiently Active (03/31/2024)   Exercise Vital Sign    Days of Exercise per Week: 5 days    Minutes of Exercise per Session: 40 min  Stress: Patient Declined (03/31/2024)   [employer] of Occupational Health - Occupational Stress Questionnaire    Feeling of Stress: Patient declined  Social Connections: Moderately Isolated (03/31/2024)   Social Connection and Isolation Panel    Frequency of Communication with Friends and Family: More than three times a week    Frequency of Social Gatherings with Friends and Family: Three times a week    Attends Religious Services: More than 4 times per year    Active Member of Clubs or Organizations: No    Attends Banker Meetings: Not on file    Marital Status: Divorced  Intimate Partner Violence: Not At Risk (03/13/2024)   Humiliation, Afraid, Rape, and Kick questionnaire    Fear of Current or Ex-Partner: No    Emotionally Abused: No    Physically Abused: No    Sexually Abused: No    Review of Systems: See HPI, otherwise negative ROS  Physical Exam: BP 106/67   Pulse 62   Temp 98.2 F (36.8 C) (Temporal)   Resp 16   Ht 5' 4 (1.626 m)   Wt 60.3 kg   SpO2 100%   BMI 22.83 kg/m  General:   Alert,  pleasant and cooperative in NAD Head:  Normocephalic and atraumatic. Neck:  Supple; no masses or thyromegaly. Lungs:  Clear throughout to auscultation.     Heart:  Regular rate and rhythm. Abdomen:  Soft, nontender and nondistended. Normal bowel sounds, without guarding, and without rebound.   Neurologic:  Alert and  oriented x4;  grossly normal neurologically.  Impression/Plan: Fartun M Mccowan is here for an colonoscopy to be performed for colon cancer screening  Risks, benefits, limitations, and alternatives regarding  colonoscopy have been reviewed with the patient.  Questions have been answered.  All parties agreeable.   Chelsea Brooklyn, MD  05/23/2024, 8:50 AM

## 2024-05-23 NOTE — Progress Notes (Signed)
 Intra-op, patient has short run of atrial fibrillation, and then spontaneously converted so sinus bradycardia.  EKG postop shows sinus bradycardia.  Patient has previously seen cardiologist in 2022, has primary care physician whom she will see Monday at 10am.  Likely went into a fib due to dehydration, but need cardiology workup. Denies chest pain, pressure, heaviness, denies shortness of breath, denies nausea, and skin is warm and dry. Eupnea.

## 2024-05-23 NOTE — Op Note (Signed)
 [facility] Gastroenterology Patient Name: Chelsea Decker Procedure Date: 05/23/2024 9:07 AM MRN: [id number] Account #: [id number] Date of Birth: [birth year] Admit Type: Outpatient Age: 63 Room: [facility] OR ROOM 01 Gender: Female Note Status: Finalized Instrument Name: Peds Colonoscope [id number] Procedure:             Colonoscopy Indications:           Screening for colorectal malignant neoplasm, Last                         colonoscopy: November 2019 Providers:             Corinn Jess Brooklyn MD, MD Referring MD:          Corinn Jess Brooklyn MD, MD (Referring MD), Dorette FALCON.                         Sowles, MD (Referring MD) Medicines:             General Anesthesia Complications:         No immediate complications. Estimated blood loss: None. Procedure:             Pre-Anesthesia Assessment:                        - Prior to the procedure, a History and Physical was                         performed, and patient medications and allergies were                         reviewed. The patient is competent. The risks and                         benefits of the procedure and the sedation options and                         risks were discussed with the patient. All questions                         were answered and informed consent was obtained.                         Patient identification and proposed procedure were                         verified by the physician, the nurse, the                         anesthesiologist, the anesthetist and the technician                         in the pre-procedure area in the procedure room in the                         endoscopy suite. Mental Status Examination: alert and                         oriented. Airway Examination: normal oropharyngeal  airway and neck mobility. Respiratory Examination:                         clear to auscultation. CV Examination: normal.                         Prophylactic  Antibiotics: The patient does not require                         prophylactic antibiotics. Prior Anticoagulants: The                         patient has taken no anticoagulant or antiplatelet                         agents. ASA Grade Assessment: II - A patient with mild                         systemic disease. After reviewing the risks and                         benefits, the patient was deemed in satisfactory                         condition to undergo the procedure. The anesthesia                         plan was to use general anesthesia. Immediately prior                         to administration of medications, the patient was                         re-assessed for adequacy to receive sedatives. The                         heart rate, respiratory rate, oxygen saturations,                         blood pressure, adequacy of pulmonary ventilation, and                         response to care were monitored throughout the                         procedure. The physical status of the patient was                         re-assessed after the procedure.                        After obtaining informed consent, the colonoscope was                         passed under direct vision. Throughout the procedure,                         the patient's blood pressure, pulse, and oxygen  saturations were monitored continuously. The                         Colonoscope was introduced through the anus and                         advanced to the the cecum, identified by appendiceal                         orifice and ileocecal valve. The colonoscopy was                         performed without difficulty. The patient tolerated                         the procedure well. The quality of the bowel                         preparation was evaluated using the BBPS [facility] Bowel                         Preparation Scale) with scores of: Right Colon = 3,                          Transverse Colon = 3 and Left Colon = 3 (entire mucosa                         seen well with no residual staining, small fragments                         of stool or opaque liquid). The total BBPS score                         equals 9. The ileocecal valve, appendiceal orifice,                         and rectum were photographed. Findings:      The perianal and digital rectal examinations were normal. Pertinent       negatives include normal sphincter tone and no palpable rectal lesions.      The entire examined colon appeared normal.      The retroflexed view of the distal rectum and anal verge was normal and       showed no anal or rectal abnormalities. Impression:            - The entire examined colon is normal.                        - The distal rectum and anal verge are normal on                         retroflexion view.                        - No specimens collected. Recommendation:        - Discharge patient to home (with escort).                        -  Resume previous diet today.                        - Continue present medications.                        - Repeat colonoscopy in 10 years for screening                         purposes. Procedure Code(s):     --- Professional ---                        H9878, Colorectal cancer screening; colonoscopy on                         individual not meeting criteria for high risk Diagnosis Code(s):     --- Professional ---                        Z12.11, Encounter for screening for malignant neoplasm                         of colon CPT copyright 2022 American Medical Association. All rights reserved. The codes documented in this report are preliminary and upon coder review may  be revised to meet current compliance requirements. Dr. Corinn Brooklyn Corinn Jess Brooklyn MD, MD 05/23/2024 9:43:20 AM This report has been signed electronically. Number of Addenda: 0 Note Initiated On: 05/23/2024 9:07 AM Scope Withdrawal Time: 0 hours  10 minutes 41 seconds  Total Procedure Duration: 0 hours 16 minutes 9 seconds  Estimated Blood Loss:  Estimated blood loss: none.      [facility]

## 2024-05-23 NOTE — Anesthesia Postprocedure Evaluation (Signed)
 Anesthesia Post Note  Patient: Chelsea Decker  Procedure(s) Performed: COLONOSCOPY (Rectum)  Patient location during evaluation: PACU Anesthesia Type: General Level of consciousness: awake and alert Pain management: pain level controlled Vital Signs Assessment: post-procedure vital signs reviewed and stable Respiratory status: spontaneous breathing, nonlabored ventilation, respiratory function stable and patient connected to nasal cannula oxygen Cardiovascular status: blood pressure returned to baseline and stable Postop Assessment: no apparent nausea or vomiting Anesthetic complications: no   No notable events documented.   Last Vitals:  Vitals:   05/23/24 0945 05/23/24 1000  BP: 112/66 118/79  Pulse: 69 (!) 54  Resp: 13 12  Temp:  (!) 36.4 C  SpO2: 97% 100%    Last Pain:  Vitals:   05/23/24 1000  TempSrc:   PainSc: 0-No pain                 Lambros Cerro C Aydon Swamy

## 2024-05-26 ENCOUNTER — Other Ambulatory Visit: Payer: Self-pay

## 2024-05-26 ENCOUNTER — Ambulatory Visit: Admitting: Family Medicine

## 2024-05-26 ENCOUNTER — Other Ambulatory Visit: Payer: Self-pay | Admitting: Family Medicine

## 2024-05-26 ENCOUNTER — Encounter: Payer: Self-pay | Admitting: Family Medicine

## 2024-05-26 VITALS — BP 118/74 | HR 64 | Temp 97.7°F | Resp 18 | Ht 64.0 in | Wt 136.4 lb

## 2024-05-26 DIAGNOSIS — Z23 Encounter for immunization: Secondary | ICD-10-CM | POA: Diagnosis not present

## 2024-05-26 DIAGNOSIS — E559 Vitamin D deficiency, unspecified: Secondary | ICD-10-CM

## 2024-05-26 DIAGNOSIS — I480 Paroxysmal atrial fibrillation: Secondary | ICD-10-CM

## 2024-05-26 MED ORDER — VITAMIN D (ERGOCALCIFEROL) 1.25 MG (50000 UNIT) PO CAPS
50000.0000 [IU] | ORAL_CAPSULE | ORAL | 1 refills | Status: AC
Start: 2024-05-26 — End: ?

## 2024-05-26 NOTE — Patient Instructions (Signed)
Atrial Fibrillation Atrial fibrillation (AFib) is a type of heartbeat that is irregular or fast. If you have AFib, your heart beats without any order. This makes it hard for your heart to pump blood in a normal way. AFib may come and go, or it may become a long-lasting problem. If AFib is not treated, it can put you at higher risk for stroke, heart failure, and other heart problems. What are the causes? AFib may be caused by diseases that damage the heart's electrical system. They include: High blood pressure. Heart failure. Heart valve diseases. Heart surgery. Diabetes. Thyroid disease. Kidney disease. Lung diseases, such as pneumonia or COPD. Sleep apnea. Sometimes the cause is not known. What increases the risk? You are more likely to develop AFib if: You are older. You exercise often and very hard. You have a family history of AFib. You are female. You are Caucasian. You are overweight. You smoke. You drink a lot of alcohol. What are the signs or symptoms? Common symptoms of this condition include: A feeling that your heart is beating very fast. Chest pain or discomfort. Feeling short of breath. Suddenly feeling light-headed or weak. Getting tired easily during activity. Fainting. Sweating. In some cases, there are no symptoms. How is this treated? Medicines to: Prevent blood clots. Treat heart rate or heart rhythm problems. Using devices, such as a pacemaker, to correct heart rhythm problems. Doing surgery to remove the part of the heart that sends bad signals. Closing an area where clots can form in the heart (left atrial appendage). In some cases, your doctor will treat other underlying conditions. Follow these instructions at home: Medicines Take over-the-counter and prescription medicines only as told by your doctor. Do not take any new medicines without first talking to your doctor. If you are taking blood thinners: Talk with your doctor before taking aspirin  or NSAIDs, such as ibuprofen. Take your medicines as told. Take them at the same time each day. Do not do things that could hurt or bruise you. Be careful to avoid falls. Wear an alert bracelet or carry a card that says you take blood thinners. Lifestyle Do not smoke or use any products that contain nicotine or tobacco. If you need help quitting, ask your doctor. Eat heart-healthy foods. Talk with your doctor about the right eating plan for you. Exercise regularly as told by your doctor. Do not drink alcohol. Lose weight if you are overweight. General instructions If you have sleep apnea, treat it as told by your doctor. Do not use diet pills unless your doctor says they are safe for you. Diet pills may make heart problems worse. Keep all follow-up visits. Your doctor will check your heart rate and rhythm regularly. Contact a doctor if: You notice a change in the speed, rhythm, or strength of your heartbeat. You are taking a blood-thinning medicine and you get more bruising. You get tired more easily when you move or exercise. You have a sudden change in weight. Get help right away if:  You have pain in your chest. You have trouble breathing. You have side effects of blood thinners, such as blood in your vomit, poop (stool), or pee (urine), or bleeding that cannot stop. You have any signs of a stroke. "BE FAST" is an easy way to remember the main warning signs: B - Balance. Dizziness, sudden trouble walking, or loss of balance. E - Eyes. Trouble seeing or a change in how you see. F - Face. Sudden weakness or loss of  feeling in the face. The face or eyelid may droop on one side. A - Arms.Weakness or loss of feeling in an arm. This happens suddenly and usually on one side of the body. S - Speech. Sudden trouble speaking, slurred speech, or trouble understanding what people say. T - Time.Time to call emergency services. Write down what time symptoms started. You have other signs of a  stroke, such as: A sudden, very bad headache with no known cause. Feeling like you may vomit (nausea). Vomiting. A seizure. These symptoms may be an emergency. Get help right away. Call 911. Do not wait to see if the symptoms will go away. Do not drive yourself to the hospital. This information is not intended to replace advice given to you by your health care provider. Make sure you discuss any questions you have with your health care provider. Document Revised: 04/19/2022 Document Reviewed: 04/19/2022 Elsevier Patient Education  2024 [employer].

## 2024-05-26 NOTE — Progress Notes (Signed)
 Name: Chelsea Decker   MRN: [id number]    DOB: [birth year]   Date:05/26/2024       Progress Note  Subjective  Chief Complaint  Chief Complaint  Patient presents with   Atrial Fibrillation    Happened during colonoscopy has appt next tues. W/ cardio   Discussed the use of AI scribe software for clinical note transcription with the patient, who gave verbal consent to proceed.  History of Present Illness Chelsea Decker is a 63 year old female who presents with paroxysmal atrial fibrillation noted during a recent colonoscopy.  She experienced paroxysmal atrial fibrillation during a colonoscopy on October 10th while under anesthesia. The arrhythmia resolved spontaneously by the time an EKG was performed. Since the procedure, she has felt heart palpitations and a 'weird feeling inside of her chest'. No current chest pain or palpitations.  She has a history of paroxysmal supraventricular tachycardia noted in 2013, for which she was evaluated for chest pain and palpitations and had a Holter monitor placed. She has seen Dr. Darron in the past and takes Metoprolol  daily   She is currently taking metoprolol , vitamin D , Linzess , Celebrex  as needed, and Tylenol  as needed. She previously took cephalexin and benzonatate  for a cough and infection but is not currently using these medications.  She has no history of high blood pressure, vascular disease, chronic heart failure, diabetes, stroke, or heart attack.       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   History of shingles 04/17/2017   Anxiety, generalized 03/20/2015   Gastro-esophageal reflux disease without esophagitis 03/20/2015   IBS (irritable bowel syndrome) 03/20/2015   Menopause 03/20/2015   Vitamin D  deficiency 03/20/2015   Vasovagal syncope 08/18/2014   PSVT (paroxysmal supraventricular tachycardia) 04/12/2012   External hemorrhoids 07/15/2007     Past Surgical History:  Procedure Laterality Date   ABDOMINAL HYSTERECTOMY     2013   BREAST BIOPSY Left 2013   core w/clip - neg   COLONOSCOPY N/A 05/23/2024   Procedure: COLONOSCOPY;  Surgeon: Unk Corinn Skiff, MD;  Location: [facility];  Service: Endoscopy;  Laterality: N/A;   COLONOSCOPY WITH PROPOFOL  N/A 06/27/2018   Procedure: COLONOSCOPY WITH PROPOFOL ;  Surgeon: Unk Corinn Skiff, MD;  Location: [facility] END[facility];  Service: Gastroenterology;  Laterality: N/A;   LAPAROSCOPIC TOTAL HYSTERECTOMY      Family History  Problem Relation Age of Onset   Cancer Mother    Diabetes Mother    Cancer Father    Diabetes Father    Breast cancer Sister     Social History   Tobacco Use   Smoking status: Never   Smokeless tobacco: Never  Substance Use Topics   Alcohol use: No    Alcohol/week: 0.0 standard drinks of alcohol     Current Outpatient Medications:    acetaminophen  (TYLENOL ) 500 MG chewable tablet, Chew 500 mg by mouth every 6 (six) hours as needed., Disp: , Rfl:    busPIRone  (BUSPAR ) 5 MG tablet, Take 1 tablet (5 mg total) by mouth 2 (two) times daily as needed. For anxiety, Disp: 60 tablet, Rfl: 0   butalbital -acetaminophen -caffeine  (FIORICET) 50-325-40 MG tablet, Take 1 tablet by mouth every 6 (six) hours as needed for headache., Disp: 30 tablet, Rfl: 1   celecoxib  (CELEBREX ) 100 MG capsule, Take 1 capsule (100 mg total) by mouth 2 (two) times daily., Disp: 60 capsule, Rfl: 0   linaclotide  (LINZESS ) 72  MCG capsule, Take 1 capsule (72 mcg total) by mouth daily., Disp: 30 capsule, Rfl: 2   metoprolol  tartrate (LOPRESSOR ) 25 MG tablet, Take 0.5 tablets (12.5 mg total) by mouth daily as needed. Palpitation, Disp: 30 tablet, Rfl: 0   Vitamin D , Ergocalciferol , (DRISDOL ) 1.25 MG (50000 UNIT) CAPS capsule, Take 1 capsule (50,000 Units total) by mouth once a week., Disp: 12 capsule, Rfl: 1  No Known Allergies  I personally reviewed active problem list, medication  list, allergies with the patient/caregiver today.   ROS  Ten systems reviewed and is negative except as mentioned in HPI    Objective Physical Exam  CONSTITUTIONAL: Patient appears well-developed and well-nourished. No distress. HEENT: Head atraumatic, normocephalic, neck supple. CARDIOVASCULAR: Bradycardia present. Regular rhythm and normal heart sounds. No murmur heard. No BLE edema. PULMONARY: Effort normal and breath sounds normal. No respiratory distress. ABDOMINAL: There is no tenderness or distention. MUSCULOSKELETAL: Normal gait. Without gross motor or sensory deficit. PSYCHIATRIC: Patient has a normal mood and affect. Behavior is normal. Judgment and thought content normal.  Vitals:   05/26/24 1007  BP: 118/74  Pulse: 64  Resp: 18  Temp: 97.7 F (36.5 C)  SpO2: 95%  Weight: 136 lb 6.4 oz (61.9 kg)  Height: 5' 4 (1.626 m)    Body mass index is 23.41 kg/m.  Recent Results (from the past 2160 hours)  Lipid panel     Status: Abnormal   Collection Time: 03/13/24 10:01 AM  Result Value Ref Range   Cholesterol 202 (H) <200 mg/dL   HDL 56 > OR = 50 mg/dL   Triglycerides 58 <849 mg/dL   LDL Cholesterol (Calc) 132 (H) mg/dL (calc)    Comment: Reference range: <100 . Desirable range <100 mg/dL for primary prevention;   <70 mg/dL for patients with CHD or diabetic patients  with > or = 2 CHD risk factors. SABRA LDL-C is now calculated using the Martin-Hopkins  calculation, which is a validated novel method providing  better accuracy than the Friedewald equation in the  estimation of LDL-C.  Gladis APPLETHWAITE et al. SANDREA. 7986;689(80): 2061-2068  (http://education.[facility].com/faq/FAQ164)    Total CHOL/HDL Ratio 3.6 <5.0 (calc)   Non-HDL Cholesterol (Calc) 146 (H) <130 mg/dL (calc)    Comment: For patients with diabetes plus 1 major ASCVD risk  factor, treating to a non-HDL-C goal of <100 mg/dL  (LDL-C of <29 mg/dL) is considered a therapeutic  option.   CBC with  Differential/Platelet     Status: None   Collection Time: 03/13/24 10:01 AM  Result Value Ref Range   WBC 7.6 3.8 - 10.8 Thousand/uL   RBC 4.28 3.80 - 5.10 Million/uL   Hemoglobin 12.4 11.7 - 15.5 g/dL   HCT 61.1 64.9 - 54.9 %   MCV 90.7 80.0 - 100.0 fL   MCH 29.0 27.0 - 33.0 pg   MCHC 32.0 32.0 - 36.0 g/dL    Comment: For adults, a slight decrease in the calculated MCHC value (in the range of 30 to 32 g/dL) is most likely not clinically significant; however, it should be interpreted with caution in correlation with other red cell parameters and the patient's clinical condition.    RDW 13.8 11.0 - 15.0 %   Platelets 200 140 - 400 Thousand/uL   MPV 11.6 7.5 - 12.5 fL   Neutro Abs 4,613 1,500 - 7,800 cells/uL   Absolute Lymphocytes 2,257 850 - 3,900 cells/uL   Absolute Monocytes 418 200 - 950 cells/uL   Eosinophils Absolute 258  15 - 500 cells/uL   Basophils Absolute 53 0 - 200 cells/uL   Neutrophils Relative % 60.7 %   Total Lymphocyte 29.7 %   Monocytes Relative 5.5 %   Eosinophils Relative 3.4 %   Basophils Relative 0.7 %  Comprehensive metabolic panel with GFR     Status: None   Collection Time: 03/13/24 10:01 AM  Result Value Ref Range   Glucose, Bld 82 65 - 99 mg/dL    Comment: .            Fasting reference interval .    BUN 12 7 - 25 mg/dL   Creat 9.22 9.49 - 8.94 mg/dL   eGFR 87 > OR = 60 fO/fpw/8.26f7   BUN/Creatinine Ratio SEE NOTE: 6 - 22 (calc)    Comment:    Not Reported: BUN and Creatinine are within    reference range. .    Sodium 140 135 - 146 mmol/L   Potassium 4.2 3.5 - 5.3 mmol/L   Chloride 105 98 - 110 mmol/L   CO2 28 20 - 32 mmol/L   Calcium 9.9 8.6 - 10.4 mg/dL   Total Protein 7.5 6.1 - 8.1 g/dL   Albumin 4.4 3.6 - 5.1 g/dL   Globulin 3.1 1.9 - 3.7 g/dL (calc)   AG Ratio 1.4 1.0 - 2.5 (calc)   Total Bilirubin 0.4 0.2 - 1.2 mg/dL   Alkaline phosphatase (APISO) 87 37 - 153 U/L   AST 13 10 - 35 U/L   ALT 10 6 - 29 U/L  VITAMIN D  25 Hydroxy  (Vit-D Deficiency, Fractures)     Status: None   Collection Time: 03/13/24 10:01 AM  Result Value Ref Range   Vit D, 25-Hydroxy 87 30 - 100 ng/mL    Comment: Vitamin D  Status         25-OH Vitamin D : . Deficiency:                    <20 ng/mL Insufficiency:             20 - 29 ng/mL Optimal:                 > or = 30 ng/mL . For 25-OH Vitamin D  testing on patients on  D2-supplementation and patients for whom quantitation  of D2 and D3 fractions is required, the [facility](TM) 25-OH VIT D, (D2,D3), LC/MS/MS is recommended: order  code 07111 (patients >19yrs). . See Note 1 . Note 1 . For additional information, please refer to  http://education.[facility].com/faq/FAQ199  (This link is being provided for informational/ educational purposes only.)   Hemoglobin A1c     Status: Abnormal   Collection Time: 03/13/24 10:01 AM  Result Value Ref Range   Hgb A1c MFr Bld 6.1 (H) <5.7 %    Comment: For someone without known diabetes, a hemoglobin  A1c value between 5.7% and 6.4% is consistent with prediabetes and should be confirmed with a  follow-up test. . For someone with known diabetes, a value <7% indicates that their diabetes is well controlled. A1c targets should be individualized based on duration of diabetes, age, comorbid conditions, and other considerations. . This assay result is consistent with an increased risk of diabetes. . Currently, no consensus exists regarding use of hemoglobin A1c for diagnosis of diabetes for children. .    Mean Plasma Glucose 128 mg/dL   eAG (mmol/L) 7.1 mmol/L  POC Covid19/Flu A&B Antigen     Status: None   Collection Time:  04/23/24 11:32 AM  Result Value Ref Range   Influenza A Antigen, POC Negative Negative   Influenza B Antigen, POC Negative Negative   Covid Antigen, POC Negative Negative    Diabetic Foot Exam:     PHQ2/9:    04/23/2024   11:24 AM 04/01/2024    1:46 PM 03/13/2024    9:09 AM 12/07/2023    9:40 AM  04/27/2023    2:49 PM  Depression screen PHQ 2/9  Decreased Interest 0 0 0 0 0  Down, Depressed, Hopeless 0 0 0 0 0  PHQ - 2 Score 0 0 0 0 0  Altered sleeping 0 0 0 0 0  Tired, decreased energy 0 0 0 0 0  Change in appetite 0 0 0 0 0  Feeling bad or failure about yourself  0 0 0 0 0  Trouble concentrating 0 0 0 0 0  Moving slowly or fidgety/restless 0 0 0 0 0  Suicidal thoughts 0 0 0 0 0  PHQ-9 Score 0 0 0 0 0  Difficult doing work/chores Not difficult at all Not difficult at all Not difficult at all Not difficult at all     phq 9 is negative  Fall Risk:    04/23/2024   11:24 AM 04/01/2024    1:46 PM 03/13/2024    9:05 AM 04/27/2023    2:49 PM 03/12/2023    8:32 AM  Fall Risk   Falls in the past year? 1 1 0 0 0  Number falls in past yr: 0 0 0 0 0  Injury with Fall?  1 0 0 0  Risk for fall due to : Impaired balance/gait Impaired balance/gait No Fall Risks No Fall Risks No Fall Risks  Follow up Falls evaluation completed Falls evaluation completed Falls evaluation completed Falls prevention discussed Falls prevention discussed     Assessment & Plan Paroxysmal atrial fibrillation Paroxysmal atrial fibrillation likely due to anesthesia or dehydration during colonoscopy, resolved spontaneously. CHAD2VASc score 1, low stroke risk. Electrolyte imbalance may have contributed. - Refer to cardiologist for evaluation. - Continue metoprolol . - Provided educational materials on atrial fibrillation. - Consider baby aspirin  daily until cardiology evaluation.  Bradycardia Mild bradycardia present, not related to AFib. Metoprolol  management appropriate due to previous history of SVT. - Continue metoprolol .  General Health Maintenance Routine health maintenance current. She inquired about flu shot and vitamin D . - Administer flu shot. - Provide flu shot documentation for work. - Ensure continuation of vitamin D  supplementation.

## 2024-05-27 ENCOUNTER — Telehealth: Payer: Self-pay | Admitting: Family Medicine

## 2024-05-27 NOTE — Telephone Encounter (Signed)
 Pt requesting a note to be dismissed from jury duty reasons is focus issues and palpitations.

## 2024-06-02 NOTE — Progress Notes (Unsigned)
  Cardiology Office Note   Date:  06/03/2024  ID:  Chelsea Decker, DOB 1961/07/16, MRN [id number] PCP: Sowles, Krichna, MD   HeartCare Providers Cardiologist:  Caron Poser, MD     History of Present Illness Chelsea Decker is a 63 y.o. female PMH PSVT who presents for further evaluation and management of a reported episode of atrial fibrillation during recent colonoscopy.  Patient underwent colonoscopy 05/23/2024.  There was reportedly atrial fibrillation present once the patient was under anesthesia.  The rhythm self terminated by the time and ECG was obtained.  Patient reports she is overall doing well.  She has occasional palpitations which resolved with as needed metoprolol .  She has not had any recent change in her symptoms.  No syncope.  No signs of heart failure.  Relevant CVD History -Low risk SPECT 01/2015 -TTE 02/2012 normal biventricular function without valvular disease   ROS: Pt denies any chest discomfort, jaw pain, arm pain, palpitations, syncope, presyncope, orthopnea, PND, or LE edema.  Studies Reviewed I have independently reviewed the patient's ECG, recent medical record.  Physical Exam VS:  BP 126/78 (BP Location: Left Arm, Patient Position: Sitting, Cuff Size: Normal)   Pulse (!) 48   Ht 5' 3 (1.6 m)   Wt 135 lb (61.2 kg)   SpO2 99%   BMI 23.91 kg/m        Wt Readings from Last 3 Encounters:  06/03/24 135 lb (61.2 kg)  05/26/24 136 lb 6.4 oz (61.9 kg)  05/23/24 133 lb (60.3 kg)    GEN: No acute distress. NECK: No JVD; No carotid bruits. CARDIAC: RRR, no murmurs, rubs, gallops. RESPIRATORY:  Clear to auscultation. EXTREMITIES:  Warm and well-perfused. No edema.  ASSESSMENT AND PLAN Paroxysmal atrial fibrillation (reported, not proven) Patient presents for further evaluation of the reported brief paroxysm of atrial fibrillation that occurred following anesthesia administration during a routine colonoscopy.  This reportedly self  converted before the team was able to obtain a twelve-lead ECG.  Her CHA2DS2-VASc score is low at 1.  Since this was by report only, it is possible that this could also have just been an episode of the patient's known paroxysmal SVT.  Plan: - Will obtain monitor to assess for any atrial fibrillation burden - Echocardiogram to assess for any possible structural contribution - Will hold off on anticoagulation for now given lack of objective data and low CHA2DS2-VASc score  PSVT Stable.  Minimally symptomatic.  Continue metoprolol  to tartrate 12.5 mg as needed for palpitations        Dispo: RTC 3 months to discuss test results; this can be done earlier as needed  Signed, Caron Poser, MD

## 2024-06-03 ENCOUNTER — Ambulatory Visit

## 2024-06-03 VITALS — BP 126/78 | HR 48 | Ht 63.0 in | Wt 135.0 lb

## 2024-06-03 DIAGNOSIS — I471 Supraventricular tachycardia, unspecified: Secondary | ICD-10-CM | POA: Diagnosis not present

## 2024-06-03 DIAGNOSIS — I480 Paroxysmal atrial fibrillation: Secondary | ICD-10-CM | POA: Diagnosis not present

## 2024-06-03 NOTE — Patient Instructions (Signed)
 Medication Instructions:  Your physician recommends that you continue on your current medications as directed. Please refer to the Current Medication list given to you today.  *If you need a refill on your cardiac medications before your next appointment, please call your pharmacy*  Lab Work: No labs ordered today  If you have labs (blood work) drawn today and your tests are completely normal, you will receive your results only by: MyChart Message (if you have MyChart) OR A paper copy in the mail If you have any lab test that is abnormal or we need to change your treatment, we will call you to review the results.  Testing/Procedures: Your physician has requested that you have an echocardiogram. Echocardiography is a painless test that uses sound waves to create images of your heart. It provides your doctor with information about the size and shape of your heart and how well your heart's chambers and valves are working.   You may receive an ultrasound enhancing agent through an IV if needed to better visualize your heart during the echo. This procedure takes approximately one hour.  There are no restrictions for this procedure.  This will take place at [address] [facility] [address], [place] [address]  Please note: We ask at that you not bring children with you during ultrasound (echo/ vascular) testing. Due to room size and safety concerns, children are not allowed in the ultrasound rooms during exams. Our front office staff cannot provide observation of children in our lobby area while testing is being conducted. An adult accompanying a patient to their appointment will only be allowed in the ultrasound room at the discretion of the ultrasound technician under special circumstances. We apologize for any inconvenience.   ZIO XT- Long Term Monitor Instructions  Your physician has requested you wear a ZIO patch monitor for 14 days.  This is a single patch monitor. [employer]  supplies one patch monitor per enrollment. Additional stickers are not available. Please do not apply patch if you will be having a Nuclear Stress Test, Echocardiogram, Cardiac CT, MRI, or Chest Xray during the period you would be wearing the monitor. The patch cannot be worn during these tests. You cannot remove and re-apply the ZIO XT patch monitor.  Your ZIO patch monitor will be mailed 3 day USPS to your address on file. It may take 3-5 days to receive your monitor after you have been enrolled. Once you have received your monitor, please review the enclosed instructions. Your monitor has already been registered assigning a specific monitor serial number to you.  Billing and Patient Assistance Program Information  We have supplied [employer] with any of your insurance information on file for billing purposes.  [employer] offers a sliding scale Patient Assistance Program for patients that do not have insurance, or whose insurance does not completely cover the cost of the ZIO monitor.  You must apply for the Patient Assistance Program to qualify for this discounted rate.  To apply, please call [employer] at [phone], select option 4, select option 2, ask to apply for Patient Assistance Program. Meredeth will ask your household income, and how many people are in your household. They will quote your out-of-pocket cost based on that information. [employer] will also be able to set up a 58-month, interest-free payment plan if needed.  Applying the monitor   Shave hair from upper left chest.  Hold abrader disc by orange tab. Rub abrader in 40 strokes over the upper left chest as indicated in  your monitor instructions.  Clean area with 4 enclosed alcohol pads. Let dry.  Apply patch as indicated in monitor instructions. Patch will be placed under collarbone on left side of chest with arrow pointing upward.  Rub patch adhesive wings for 2 minutes. Remove white label marked 1. Remove the white label marked 2. Rub  patch adhesive wings for 2 additional minutes.  While looking in a mirror, press and release button in center of patch. A small green light will flash 3-4 times. This will be your only indicator that the monitor has been turned on.   After Applying Monitor: Do not shower for the first 24 hours. You may shower after the first 24 hours.  Press the button if you feel a symptom. You will hear a small click. Record Date, Time and Symptom in the Patient Logbook.   After Completing 14 Days: When you are ready to remove the patch, follow instructions on the last 2 pages of Patient Logbook.  Stick patch monitor into the tabs at the bottom of the return box.  Place Patient Logbook in the blue and white box. Use locking tab on box and tape box closed securely. The blue and white box has prepaid postage on it. Please place it in the mailbox as soon as possible. Your physician should have your test results approximately 7-14 days after the monitor has been mailed back to [facility].   Troubleshooting: Call [facility] at [phone] if you have questions regarding your ZIO XT patch monitor.  Call them immediately if you see an orange light blinking on your monitor.  If your monitor falls off in less than 4 days, contact our Monitor department at [phone].  If your monitor becomes loose or falls off after 4 days call [employer] at [phone] for suggestions on securing your monitor.   Follow-Up: At [facility], you and your health needs are our priority.  As part of our continuing mission to provide you with exceptional heart care, our providers are all part of one team.  This team includes your primary Cardiologist (physician) and Advanced Practice Providers or APPs (Physician Assistants and Nurse Practitioners) who all work together to provide you with the care you need, when you need it.  Your next appointment:   3 month(s)  Provider:   You may see Caron Poser,  MD   We recommend signing up for the patient portal called MyChart.  Sign up information is provided on this After Visit Summary.  MyChart is used to connect with patients for Virtual Visits (Telemedicine).  Patients are able to view lab/test results, encounter notes, upcoming appointments, etc.  Non-urgent messages can be sent to your provider as well.   To learn more about what you can do with MyChart, go to [email or web address].

## 2024-06-09 ENCOUNTER — Ambulatory Visit: Admitting: Family Medicine

## 2024-06-11 ENCOUNTER — Encounter: Payer: Self-pay | Admitting: Family Medicine

## 2024-06-11 ENCOUNTER — Ambulatory Visit: Admitting: Family Medicine

## 2024-06-11 VITALS — BP 130/70 | HR 76 | Resp 16 | Ht 63.0 in | Wt 136.0 lb

## 2024-06-11 DIAGNOSIS — F411 Generalized anxiety disorder: Secondary | ICD-10-CM

## 2024-06-11 DIAGNOSIS — I471 Supraventricular tachycardia, unspecified: Secondary | ICD-10-CM | POA: Diagnosis not present

## 2024-06-11 DIAGNOSIS — R7303 Prediabetes: Secondary | ICD-10-CM | POA: Diagnosis not present

## 2024-06-11 DIAGNOSIS — G44209 Tension-type headache, unspecified, not intractable: Secondary | ICD-10-CM

## 2024-06-11 DIAGNOSIS — K581 Irritable bowel syndrome with constipation: Secondary | ICD-10-CM

## 2024-06-11 DIAGNOSIS — I480 Paroxysmal atrial fibrillation: Secondary | ICD-10-CM | POA: Diagnosis not present

## 2024-06-11 DIAGNOSIS — Z23 Encounter for immunization: Secondary | ICD-10-CM

## 2024-06-11 DIAGNOSIS — K219 Gastro-esophageal reflux disease without esophagitis: Secondary | ICD-10-CM

## 2024-06-11 MED ORDER — LINACLOTIDE 72 MCG PO CAPS: 72.0000 ug | ORAL_CAPSULE | Freq: Every day | ORAL | 2 refills | Status: AC

## 2024-06-11 MED ORDER — PANTOPRAZOLE SODIUM 20 MG PO TBEC: 20.0000 mg | DELAYED_RELEASE_TABLET | Freq: Every day | ORAL | 0 refills | Status: AC | PRN

## 2024-06-11 MED ORDER — BUSPIRONE HCL 5 MG PO TABS: 5.0000 mg | ORAL_TABLET | Freq: Two times a day (BID) | ORAL | 0 refills | Status: AC | PRN

## 2024-06-11 MED ORDER — BUTALBITAL-APAP-CAFFEINE 50-325-40 MG PO TABS
1.0000 | ORAL_TABLET | Freq: Four times a day (QID) | ORAL | 1 refills | Status: AC | PRN
Start: 2024-06-11 — End: ?

## 2024-06-11 NOTE — Progress Notes (Signed)
 Name: Chelsea Decker   MRN: [id number]    DOB: 13-Sep-1960   Date:06/11/2024       Progress Note  Subjective  Chief Complaint  Chief Complaint  Patient presents with   Medical Management of Chronic Issues   Discussed the use of AI scribe software for clinical note transcription with the patient, who gave verbal consent to proceed.  History of Present Illness Chelsea Decker is a 63 year old female with paroxysmal atrial fibrillation who presents for a six-month follow-up.  She was diagnosed with paroxysmal atrial fibrillation during her colonoscopy on May 23, 2024, when atrial fibrillation was noted under anesthesia. An EKG was not obtained at that time to confirm the diagnosis. She is currently wearing a Zio patch to monitor her heart rhythm. She experiences occasional palpitations and shortness of breath and takes metoprolol  12.5 mg as needed for palpitations.  She has a history of paroxysmal supraventricular tachycardia and prediabetes. Her last A1c was 6.1 three months ago, and her fasting blood sugar was 82. She feels 'very thirsty' at times but denies excessive hunger or frequent urination. She maintains a balanced diet and avoids excessive desserts.  She experiences tension headaches at least twice a week and takes Fioricet as needed. She has a history of anxiety and takes Buspar  as needed. She reports that her current job is less stressful than her previous one, as she works alone in [employer] and is not disturbed by others.  She has IBS with constipation and takes Linzess  twice a week, which sometimes causes diarrhea. She has bowel movements even when not taking Linzess . She also experiences reflux symptoms but does not take regular medication for it, preferring to use over-the-counter options as needed.  She takes vitamin D  every other week due to previously high levels. She also has a history of sciatica, which occurs occasionally, and takes Celebrex  as needed for back  pain. She does not take regular medication for reflux and prefers to use over-the-counter options as needed.    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   History of shingles 04/17/2017   Anxiety, generalized 03/20/2015   Gastro-esophageal reflux disease without esophagitis 03/20/2015   IBS (irritable bowel syndrome) 03/20/2015   Menopause 03/20/2015   Vitamin D  deficiency 03/20/2015   Vasovagal syncope 08/18/2014   PSVT (paroxysmal supraventricular tachycardia) 04/12/2012   External hemorrhoids 07/15/2007    Past Surgical History:  Procedure Laterality Date   ABDOMINAL HYSTERECTOMY     2013   BREAST BIOPSY Left 2013   core w/clip - neg   COLONOSCOPY N/A 05/23/2024   Procedure: COLONOSCOPY;  Surgeon: Unk Corinn Skiff, MD;  Location[facility];  Service: Endoscopy;  Laterality: N/A;   COLONOSCOPY WITH PROPOFOL  N/A 06/27/2018   Procedure: COLONOSCOPY WITH PROPOFOL ;  Surgeon: Unk Corinn Skiff, MD;  Location: [facility];  Service: Gastroenterology;  Laterality: N/A;   LAPAROSCOPIC TOTAL HYSTERECTOMY      Family History  Problem Relation Age of Onset   Cancer Mother    Diabetes Mother    Cancer Father    Diabetes Father    Breast cancer Sister     Social History   Tobacco Use   Smoking status: Never   Smokeless tobacco: Never  Substance Use Topics   Alcohol use: No    Alcohol/week: 0.0 standard drinks of alcohol     Current Outpatient Medications:    acetaminophen  (TYLENOL ) 500  MG chewable tablet, Chew 500 mg by mouth every 6 (six) hours as needed., Disp: , Rfl:    busPIRone  (BUSPAR ) 5 MG tablet, Take 1 tablet (5 mg total) by mouth 2 (two) times daily as needed. For anxiety, Disp: 60 tablet, Rfl: 0   butalbital -acetaminophen -caffeine  (FIORICET) 50-325-40 MG tablet, Take 1 tablet by mouth every 6 (six) hours as needed for headache., Disp: 30 tablet, Rfl: 1   celecoxib  (CELEBREX )  100 MG capsule, Take 1 capsule (100 mg total) by mouth 2 (two) times daily., Disp: 60 capsule, Rfl: 0   linaclotide  (LINZESS ) 72 MCG capsule, Take 1 capsule (72 mcg total) by mouth daily., Disp: 30 capsule, Rfl: 2   metoprolol  tartrate (LOPRESSOR ) 25 MG tablet, Take 0.5 tablets (12.5 mg total) by mouth daily as needed. Palpitation, Disp: 30 tablet, Rfl: 0   Vitamin D , Ergocalciferol , (DRISDOL ) 1.25 MG (50000 UNIT) CAPS capsule, Take 1 capsule (50,000 Units total) by mouth once a week., Disp: 12 capsule, Rfl: 1  No Known Allergies  I personally reviewed active problem list, medication list, allergies, family history with the patient/caregiver today.   ROS  Ten systems reviewed and is negative except as mentioned in HPI    Objective Physical Exam CONSTITUTIONAL: Patient appears well-developed and well-nourished. No distress. HEENT: Head atraumatic, normocephalic, neck supple. CARDIOVASCULAR: Normal rate, regular rhythm and normal heart sounds. No murmur heard. No BLE edema. PULMONARY: Effort normal and breath sounds normal. No respiratory distress. ABDOMINAL: There is no tenderness or distention. MUSCULOSKELETAL: Normal gait. Without gross motor or sensory deficit. PSYCHIATRIC: Patient has a normal mood and affect. Behavior is normal. Judgment and thought content normal.  Vitals:   06/11/24 0939  BP: 130/70  Pulse: 76  Resp: 16  SpO2: 97%  Weight: 136 lb (61.7 kg)  Height: 5' 3 (1.6 m)    Body mass index is 24.09 kg/m.  Recent Results (from the past 2160 hours)  POC Covid19/Flu A&B Antigen     Status: None   Collection Time: 04/23/24 11:32 AM  Result Value Ref Range   Influenza A Antigen, POC Negative Negative   Influenza B Antigen, POC Negative Negative   Covid Antigen, POC Negative Negative     PHQ2/9:    04/23/2024   11:24 AM 04/01/2024    1:46 PM 03/13/2024    9:09 AM 12/07/2023    9:40 AM 04/27/2023    2:49 PM  Depression screen PHQ 2/9  Decreased Interest 0 0 0  0 0  Down, Depressed, Hopeless 0 0 0 0 0  PHQ - 2 Score 0 0 0 0 0  Altered sleeping 0 0 0 0 0  Tired, decreased energy 0 0 0 0 0  Change in appetite 0 0 0 0 0  Feeling bad or failure about yourself  0 0 0 0 0  Trouble concentrating 0 0 0 0 0  Moving slowly or fidgety/restless 0 0 0 0 0  Suicidal thoughts 0 0 0 0 0  PHQ-9 Score 0 0 0 0 0  Difficult doing work/chores Not difficult at all Not difficult at all Not difficult at all Not difficult at all     phq 9 is negative  Fall Risk:    04/23/2024   11:24 AM 04/01/2024    1:46 PM 03/13/2024    9:05 AM 04/27/2023    2:49 PM 03/12/2023    8:32 AM  Fall Risk   Falls in the past year? 1 1 0 0 0  Number falls in  past yr: 0 0 0 0 0  Injury with Fall?  1 0 0 0  Risk for fall due to : Impaired balance/gait Impaired balance/gait No Fall Risks No Fall Risks No Fall Risks  Follow up Falls evaluation completed Falls evaluation completed Falls evaluation completed Falls prevention discussed Falls prevention discussed    Assessment & Plan Cardiac arrhythmias (paroxysmal atrial fibrillation and supraventricular tachycardia) Paroxysmal atrial fibrillation suspected but not confirmed. Low risk of heart attack and stroke with a score of 1. Currently using a Zio patch for monitoring. - Continue Zio patch monitoring until November 4th. - Order echocardiogram after Zio patch removal. - Continue metoprolol  12.5 mg as needed for palpitations.  Tension-type headache Experiencing tension-type headaches at least twice a week. Consideration of daily metoprolol  post-cardiac evaluation to reduce headache frequency. - Continue Fioricet as needed for headaches. - Consider daily metoprolol  post-cardiac evaluation to reduce headache frequency.  Prediabetes A1c is 6.1, indicating prediabetes. Fasting glucose is 82. Diet is balanced with minimal dessert intake.  Irritable bowel syndrome with constipation Experiencing constipation with occasional diarrhea. -  Continue Linzess  72 mg twice a week as needed for constipation.  Gastroesophageal reflux disease Occasional reflux symptoms. - Prescribe omeprazole as needed for reflux symptoms.  Generalized anxiety disorder Anxiety is better managed in current job environment. - Continue Buspar  as needed for anxiety.

## 2024-06-23 DIAGNOSIS — I471 Supraventricular tachycardia, unspecified: Secondary | ICD-10-CM | POA: Diagnosis not present

## 2024-06-23 DIAGNOSIS — I480 Paroxysmal atrial fibrillation: Secondary | ICD-10-CM | POA: Diagnosis not present

## 2024-06-25 ENCOUNTER — Ambulatory Visit: Payer: Self-pay

## 2024-06-25 DIAGNOSIS — I480 Paroxysmal atrial fibrillation: Secondary | ICD-10-CM | POA: Diagnosis not present

## 2024-06-25 DIAGNOSIS — I471 Supraventricular tachycardia, unspecified: Secondary | ICD-10-CM | POA: Diagnosis not present

## 2024-07-22 ENCOUNTER — Ambulatory Visit

## 2024-07-22 DIAGNOSIS — I480 Paroxysmal atrial fibrillation: Secondary | ICD-10-CM

## 2024-07-22 DIAGNOSIS — I471 Supraventricular tachycardia, unspecified: Secondary | ICD-10-CM

## 2024-07-22 LAB — ECHOCARDIOGRAM COMPLETE
AR max vel: 2.54 cm2
AV Area VTI: 2.48 cm2
AV Area mean vel: 2.39 cm2
AV Mean grad: 3 mmHg
AV Peak grad: 6.6 mmHg
Ao pk vel: 1.28 m/s
Area-P 1/2: 4.68 cm2
S' Lateral: 2.7 cm

## 2024-08-22 ENCOUNTER — Ambulatory Visit (INDEPENDENT_AMBULATORY_CARE_PROVIDER_SITE_OTHER): Admitting: Nurse Practitioner

## 2024-08-22 ENCOUNTER — Encounter: Payer: Self-pay | Admitting: Nurse Practitioner

## 2024-08-22 VITALS — BP 120/82 | HR 87 | Temp 98.0°F | Ht 63.0 in | Wt 135.0 lb

## 2024-08-22 DIAGNOSIS — J069 Acute upper respiratory infection, unspecified: Secondary | ICD-10-CM | POA: Diagnosis not present

## 2024-08-22 DIAGNOSIS — R52 Pain, unspecified: Secondary | ICD-10-CM

## 2024-08-22 LAB — POC COVID19/FLU A&B COMBO
Covid Antigen, POC: NEGATIVE
Influenza A Antigen, POC: NEGATIVE
Influenza B Antigen, POC: NEGATIVE

## 2024-08-22 MED ORDER — PROMETHAZINE-DM 6.25-15 MG/5ML PO SYRP: 5.0000 mL | ORAL_SOLUTION | Freq: Four times a day (QID) | ORAL | 0 refills | Status: AC | PRN

## 2024-08-22 MED ORDER — BENZONATATE 100 MG PO CAPS: 200.0000 mg | ORAL_CAPSULE | Freq: Two times a day (BID) | ORAL | 0 refills | Status: AC | PRN

## 2024-08-22 NOTE — Progress Notes (Signed)
 "  BP 120/82   Pulse 87   Temp 98 F (36.7 C)   Ht 5' 3 (1.6 m)   Wt 135 lb (61.2 kg)   SpO2 99%   BMI 23.91 kg/m    Subjective:    Patient ID: Chelsea Decker, female    DOB: 01/06/1961, 64 y.o.   MRN: [id number]  HPI: Chelsea Decker is a 64 y.o. female  Chief Complaint  Patient presents with   Generalized Body Aches    Pt c/o body aches and headache x2 days.    Discussed the use of AI scribe software for clinical note transcription with the patient, who gave verbal consent to proceed.  History of Present Illness Chelsea Decker is a 64 year old female who presents with body aches and headache for two days.  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 - Primary contact with her son, who has been sick recently         08/22/2024   11:12 AM 04/23/2024   11:24 AM 04/01/2024    1:46 PM  Depression screen PHQ 2/9  Decreased Interest 0 0 0  Down, Depressed, Hopeless 0 0 0  PHQ - 2 Score 0 0 0  Altered sleeping 0 0 0  Tired, decreased energy 0 0 0  Change in appetite 0 0 0  Feeling bad or failure about yourself  0 0 0  Trouble concentrating 0 0 0  Moving slowly or fidgety/restless 0 0 0  Suicidal thoughts 0 0 0  PHQ-9 Score 0 0  0   Difficult doing work/chores  Not difficult at all Not difficult at all     Data saved with a previous flowsheet row definition    Relevant past medical, surgical, family and social history reviewed and updated as indicated. Interim medical history since our last visit reviewed. Allergies and medications reviewed and updated.  Review of Systems  Ten systems reviewed and is  negative except as mentioned in HPI      Objective:      BP 120/82   Pulse 87   Temp 98 F (36.7 C)   Ht 5' 3 (1.6 m)   Wt 135 lb (61.2 kg)   SpO2 99%   BMI 23.91 kg/m    Wt Readings from Last 3 Encounters:  08/22/24 135 lb (61.2 kg)  06/11/24 136 lb (61.7 kg)  06/03/24 135 lb (61.2 kg)    Physical Exam GENERAL: Alert, cooperative, well developed, no acute distress. HEENT: Normocephalic, normal oropharynx, moist mucous membranes. CHEST: Clear to auscultation bilaterally, no wheezes, rhonchi, or crackles. CARDIOVASCULAR: Normal heart rate and rhythm, S1 and S2 normal without murmurs. ABDOMEN: Soft, non-tender, non-distended, without organomegaly, normal bowel sounds. EXTREMITIES: No cyanosis or edema. NEUROLOGICAL: Cranial nerves grossly intact, moves all extremities without gross motor or sensory deficit.  Results for orders placed or performed in visit on 08/22/24  POC Covid19/Flu A&B Antigen   Collection Time: 08/22/24 11:23 AM  Result Value Ref Range   Influenza A Antigen, POC Negative Negative   Influenza B Antigen, POC Negative Negative   Covid Antigen, POC Negative Negative  Assessment & Plan:   Problem List Items Addressed This Visit   None Visit Diagnoses       Body aches    -  Primary   Relevant Orders   POC Covid19/Flu A&B Antigen (Completed)     Viral upper respiratory tract infection       Relevant Medications   promethazine -dextromethorphan (PROMETHAZINE -DM) 6.25-15 MG/5ML syrup   benzonatate  (TESSALON ) 100 MG capsule        Assessment and Plan Assessment & Plan Acute upper respiratory infection Symptoms include body aches, headache, cough, and sinus congestion for two days. COVID and flu tests are negative, indicating a likely viral etiology other than these common viruses. No fever reported, but chills and feeling feverish are present. Lungs are clear on examination. - Recommended Flonase, Zyrtec or Claritin, and plain Mucinex  for symptom relief. - Advised increased fluid intake and rest. - Prescribed Tessalon  Perles and Phenergan  DM for cough. - Suggested vitamin C, vitamin D , and zinc to boost the immune system. - Recommended Tylenol  or ibuprofen for body aches.        Follow up plan: Return if symptoms worsen or fail to improve. "

## 2024-09-03 NOTE — Progress Notes (Unsigned)
 " Cardiology Office Note   Date:  09/04/2024  ID:  Skylynne, Schlechter 07-24-1961, MRN [id number] PCP: Sowles, Krichna, MD  [facility] Providers Cardiologist:  Caron Poser, MD     History of Present Illness Chelsea Decker is a 64 y.o. female PMH PSVT who presents for further evaluation and management of a reported episode of atrial fibrillation during recent colonoscopy.  Patient underwent colonoscopy 05/23/2024.  There was reportedly atrial fibrillation present once the patient was under anesthesia.  The rhythm self terminated by the time and ECG was obtained.  Patient reports she is overall doing well.  She has occasional palpitations which resolved with as needed metoprolol .  She has not had any recent change in her symptoms.  No syncope.  No signs of heart failure. Last LDL 132 02/2024.  Interval history: Since last visit, we obtained a monitor and echocardiogram which were essentially normal.  She notes she has very occasional palpitations, but for the most part is feeling and doing well.  No other complaints today.  Relevant CVD History -TTE 07/22/2024 normal biventricular function, no significant valvular disease -Monitor 06/2024 mean heart rate 62 bpm, rare ectopy, no triggers, 2 brief salvos of SVT (longest 5 beats). -CTPA 02/2017 no CAC or aortic atherosclerosis -Low risk SPECT 01/2015 -TTE 02/2012 normal biventricular function without valvular disease   ROS: Pt denies any chest discomfort, jaw pain, arm pain, syncope, presyncope, orthopnea, PND, or LE edema.  Studies Reviewed I have independently reviewed the patient's ECG, recent medical record.  Physical Exam VS:  BP 120/70 (BP Location: Left Arm, Patient Position: Sitting, Cuff Size: Normal)   Pulse 62   Ht 5' 4 (1.626 m)   Wt 132 lb (59.9 kg)   SpO2 99%   BMI 22.66 kg/m        Wt Readings from Last 3 Encounters:  09/04/24 132 lb (59.9 kg)  08/22/24 135 lb (61.2 kg)  06/11/24 136 lb (61.7 kg)     GEN: No acute distress. NECK: No JVD; No carotid bruits. CARDIAC: RRR, no murmurs, rubs, gallops. RESPIRATORY:  Clear to auscultation. EXTREMITIES:  Warm and well-perfused. No edema.  ASSESSMENT AND PLAN Paroxysmal atrial fibrillation (reported, not proven) Patient presents for further evaluation of the reported brief paroxysm of atrial fibrillation that occurred following anesthesia administration during a routine colonoscopy.  This reportedly self converted before the team was able to obtain a twelve-lead ECG.  Her CHA2DS2-VASc score is low at 1.  Since this was by report only, it is possible that this could also have just been an episode of the patient's known paroxysmal SVT.  We ultimately obtained a monitor and echocardiogram which were normal; no atrial fibrillation was seen.  Plan: - Will hold off on anticoagulation for now given lack of objective data and low CHA2DS2-VASc score - No further treatment indicated; I asked her to reach back out to me should she have any suggestive symptoms  PSVT Paroxysmal tachycardia Stable.  Minimally symptomatic.  Continue metoprolol  tartrate 12.5 mg as needed for palpitations.  Discussed scheduled metoprolol  should the symptoms start happening more frequently or become particularly bothersome; she will let me know if this is the case  HLD Cardiac risk counseling Last LDL 132 02/2024. ASCVD score borderline at 5.7%. No hard indication for statin therapy at this time. Discussed with patient today. Will continue lifestyle modifications for time being.        Dispo: RTC 1 year or sooner as needed  Signed, Caron  Ar[place], MD  "

## 2024-09-04 ENCOUNTER — Ambulatory Visit

## 2024-09-04 VITALS — BP 120/70 | HR 62 | Ht 64.0 in | Wt 132.0 lb

## 2024-09-04 DIAGNOSIS — Z7189 Other specified counseling: Secondary | ICD-10-CM

## 2024-09-04 DIAGNOSIS — I479 Paroxysmal tachycardia, unspecified: Secondary | ICD-10-CM | POA: Diagnosis not present

## 2024-09-04 DIAGNOSIS — E782 Mixed hyperlipidemia: Secondary | ICD-10-CM | POA: Diagnosis not present

## 2024-09-04 DIAGNOSIS — I471 Supraventricular tachycardia, unspecified: Secondary | ICD-10-CM

## 2024-09-04 NOTE — Patient Instructions (Signed)
 Medication Instructions:  Your physician recommends that you continue on your current medications as directed. Please refer to the Current Medication list given to you today.  *If you need a refill on your cardiac medications before your next appointment, please call your pharmacy*  Lab Work: No labs ordered today  If you have labs (blood work) drawn today and your tests are completely normal, you will receive your results only by: MyChart Message (if you have MyChart) OR A paper copy in the mail If you have any lab test that is abnormal or we need to change your treatment, we will call you to review the results.  Testing/Procedures: No test ordered today   Follow-Up: At [facility], you and your health needs are our priority.  As part of our continuing mission to provide you with exceptional heart care, our providers are all part of one team.  This team includes your primary Cardiologist (physician) and Advanced Practice Providers or APPs (Physician Assistants and Nurse Practitioners) who all work together to provide you with the care you need, when you need it.  Your next appointment:   12 month(s)  Provider:   Caron Poser, MD    We recommend signing up for the patient portal called MyChart.  Sign up information is provided on this After Visit Summary.  MyChart is used to connect with patients for Virtual Visits (Telemedicine).  Patients are able to view lab/test results, encounter notes, upcoming appointments, etc.  Non-urgent messages can be sent to your provider as well.   To learn more about what you can do with MyChart, go to [email or web address].

## 2024-12-12 ENCOUNTER — Ambulatory Visit: Admitting: Family Medicine

## 2025-03-19 ENCOUNTER — Encounter: Admitting: Family Medicine
